# Patient Record
Sex: Female | Born: 1985 | Race: Black or African American | Hispanic: No | Marital: Single | Smoking: Never smoker
Health system: Southern US, Community
[De-identification: ages and names within clinical notes are randomized; demographics above are authoritative.]

## PROBLEM LIST (undated history)

## (undated) DIAGNOSIS — R102 Pelvic and perineal pain unspecified side: Secondary | ICD-10-CM

## (undated) DIAGNOSIS — F909 Attention-deficit hyperactivity disorder, unspecified type: Secondary | ICD-10-CM

## (undated) DIAGNOSIS — E559 Vitamin D deficiency, unspecified: Secondary | ICD-10-CM

## (undated) DIAGNOSIS — R112 Nausea with vomiting, unspecified: Secondary | ICD-10-CM

## (undated) DIAGNOSIS — T7840XA Allergy, unspecified, initial encounter: Secondary | ICD-10-CM

## (undated) DIAGNOSIS — M549 Dorsalgia, unspecified: Secondary | ICD-10-CM

## (undated) DIAGNOSIS — D649 Anemia, unspecified: Secondary | ICD-10-CM

## (undated) DIAGNOSIS — M1990 Unspecified osteoarthritis, unspecified site: Secondary | ICD-10-CM

## (undated) DIAGNOSIS — E0500 Thyrotoxicosis with diffuse goiter without thyrotoxic crisis or storm: Secondary | ICD-10-CM

## (undated) DIAGNOSIS — F419 Anxiety disorder, unspecified: Secondary | ICD-10-CM

## (undated) DIAGNOSIS — E538 Deficiency of other specified B group vitamins: Secondary | ICD-10-CM

## (undated) DIAGNOSIS — K219 Gastro-esophageal reflux disease without esophagitis: Secondary | ICD-10-CM

## (undated) DIAGNOSIS — F988 Other specified behavioral and emotional disorders with onset usually occurring in childhood and adolescence: Secondary | ICD-10-CM

## (undated) DIAGNOSIS — R87629 Unspecified abnormal cytological findings in specimens from vagina: Secondary | ICD-10-CM

## (undated) HISTORY — DX: Deficiency of other specified B group vitamins: E53.8

## (undated) HISTORY — DX: Anxiety disorder, unspecified: F41.9

## (undated) HISTORY — DX: Vitamin D deficiency, unspecified: E55.9

## (undated) HISTORY — DX: Other specified behavioral and emotional disorders with onset usually occurring in childhood and adolescence: F98.8

## (undated) HISTORY — DX: Thyrotoxicosis with diffuse goiter without thyrotoxic crisis or storm: E05.00

## (undated) HISTORY — DX: Gastro-esophageal reflux disease without esophagitis: K21.9

## (undated) HISTORY — DX: Nausea with vomiting, unspecified: R11.2

## (undated) HISTORY — DX: Allergy, unspecified, initial encounter: T78.40XA

## (undated) HISTORY — DX: Unspecified osteoarthritis, unspecified site: M19.90

## (undated) HISTORY — PX: WISDOM TOOTH EXTRACTION: SHX21

## (undated) HISTORY — PX: OTHER SURGICAL HISTORY: SHX169

---
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
## 2005-03-30 ENCOUNTER — Ambulatory Visit: Payer: Self-pay | Admitting: Internal Medicine

## 2005-04-12 ENCOUNTER — Ambulatory Visit: Payer: Self-pay | Admitting: Internal Medicine

## 2005-04-23 ENCOUNTER — Ambulatory Visit: Payer: Self-pay | Admitting: Internal Medicine

## 2005-05-21 ENCOUNTER — Ambulatory Visit: Payer: Self-pay | Admitting: Internal Medicine

## 2005-06-14 ENCOUNTER — Other Ambulatory Visit: Admission: RE | Admit: 2005-06-14 | Discharge: 2005-06-14 | Payer: Self-pay | Admitting: Internal Medicine

## 2005-08-30 ENCOUNTER — Ambulatory Visit: Payer: Self-pay | Admitting: Internal Medicine

## 2005-10-12 ENCOUNTER — Ambulatory Visit: Payer: Self-pay | Admitting: Internal Medicine

## 2005-10-16 ENCOUNTER — Other Ambulatory Visit: Admission: RE | Admit: 2005-10-16 | Discharge: 2005-10-16 | Payer: Self-pay | Admitting: Internal Medicine

## 2005-10-23 ENCOUNTER — Ambulatory Visit: Payer: Self-pay | Admitting: Internal Medicine

## 2005-11-07 ENCOUNTER — Other Ambulatory Visit: Admission: RE | Admit: 2005-11-07 | Discharge: 2005-11-07 | Payer: Self-pay | Admitting: Obstetrics and Gynecology

## 2006-03-14 ENCOUNTER — Inpatient Hospital Stay (HOSPITAL_COMMUNITY): Admission: AD | Admit: 2006-03-14 | Discharge: 2006-03-14 | Payer: Self-pay | Admitting: Obstetrics and Gynecology

## 2006-05-20 ENCOUNTER — Inpatient Hospital Stay (HOSPITAL_COMMUNITY): Admission: AD | Admit: 2006-05-20 | Discharge: 2006-05-20 | Payer: Self-pay | Admitting: *Deleted

## 2006-05-22 ENCOUNTER — Inpatient Hospital Stay (HOSPITAL_COMMUNITY): Admission: AD | Admit: 2006-05-22 | Discharge: 2006-05-24 | Payer: Self-pay | Admitting: Obstetrics and Gynecology

## 2006-07-02 LAB — CONVERTED CEMR LAB: Pap Smear: NORMAL

## 2006-09-11 ENCOUNTER — Other Ambulatory Visit: Admission: RE | Admit: 2006-09-11 | Discharge: 2006-09-11 | Payer: Self-pay | Admitting: Internal Medicine

## 2006-10-10 ENCOUNTER — Ambulatory Visit: Payer: Self-pay | Admitting: Internal Medicine

## 2006-10-10 LAB — CONVERTED CEMR LAB: Beta hcg, urine, semiquantitative: NEGATIVE

## 2006-10-14 LAB — CONVERTED CEMR LAB
ALT: 13 units/L (ref 0–35)
Basophils Absolute: 0 10*3/uL (ref 0.0–0.1)
Calcium: 9.1 mg/dL (ref 8.4–10.5)
Cholesterol: 195 mg/dL (ref 0–200)
Eosinophils Absolute: 0.2 10*3/uL (ref 0.0–0.6)
Eosinophils Relative: 4.1 % (ref 0.0–5.0)
GFR calc Af Amer: 103 mL/min
GFR calc non Af Amer: 85 mL/min
Glucose, Bld: 87 mg/dL (ref 70–99)
Lymphocytes Relative: 31.8 % (ref 12.0–46.0)
MCHC: 33.4 g/dL (ref 30.0–36.0)
MCV: 86.7 fL (ref 78.0–100.0)
Neutro Abs: 3.4 10*3/uL (ref 1.4–7.7)
Platelets: 190 10*3/uL (ref 150–400)
Sodium: 139 meq/L (ref 135–145)
TSH: 1.03 microintl units/mL (ref 0.35–5.50)
WBC: 5.8 10*3/uL (ref 4.5–10.5)

## 2007-01-09 ENCOUNTER — Ambulatory Visit: Payer: Self-pay | Admitting: Internal Medicine

## 2007-04-08 ENCOUNTER — Ambulatory Visit: Payer: Self-pay | Admitting: Internal Medicine

## 2007-06-26 ENCOUNTER — Encounter (HOSPITAL_COMMUNITY): Admission: RE | Admit: 2007-06-26 | Discharge: 2007-06-27 | Payer: Self-pay | Admitting: Internal Medicine

## 2007-07-02 ENCOUNTER — Ambulatory Visit: Payer: Self-pay | Admitting: Internal Medicine

## 2007-10-01 ENCOUNTER — Other Ambulatory Visit: Admission: RE | Admit: 2007-10-01 | Discharge: 2007-10-01 | Payer: Self-pay | Admitting: Internal Medicine

## 2007-10-08 ENCOUNTER — Encounter: Admission: RE | Admit: 2007-10-08 | Discharge: 2007-10-08 | Payer: Self-pay | Admitting: Internal Medicine

## 2007-11-03 ENCOUNTER — Encounter: Payer: Self-pay | Admitting: Internal Medicine

## 2007-11-04 ENCOUNTER — Encounter: Payer: Self-pay | Admitting: Internal Medicine

## 2007-11-04 ENCOUNTER — Ambulatory Visit (HOSPITAL_COMMUNITY): Admission: RE | Admit: 2007-11-04 | Discharge: 2007-11-04 | Payer: Self-pay | Admitting: Sports Medicine

## 2007-11-06 ENCOUNTER — Encounter: Payer: Self-pay | Admitting: Internal Medicine

## 2007-11-13 ENCOUNTER — Encounter: Payer: Self-pay | Admitting: Internal Medicine

## 2007-12-24 ENCOUNTER — Ambulatory Visit: Payer: Self-pay | Admitting: Internal Medicine

## 2007-12-24 DIAGNOSIS — R197 Diarrhea, unspecified: Secondary | ICD-10-CM

## 2007-12-24 DIAGNOSIS — F411 Generalized anxiety disorder: Secondary | ICD-10-CM | POA: Insufficient documentation

## 2007-12-24 DIAGNOSIS — R112 Nausea with vomiting, unspecified: Secondary | ICD-10-CM

## 2007-12-24 DIAGNOSIS — R1013 Epigastric pain: Secondary | ICD-10-CM

## 2007-12-24 LAB — CONVERTED CEMR LAB: Tissue Transglutaminase Ab, IgA: 0.5 units (ref <7)

## 2007-12-26 ENCOUNTER — Encounter: Payer: Self-pay | Admitting: Internal Medicine

## 2007-12-26 LAB — CONVERTED CEMR LAB
Basophils Absolute: 0.1 10*3/uL (ref 0.0–0.1)
Basophils Relative: 1.2 % (ref 0.0–3.0)
Eosinophils Absolute: 0.1 10*3/uL (ref 0.0–0.7)
Eosinophils Relative: 1.1 % (ref 0.0–5.0)
HCT: 36.2 % (ref 36.0–46.0)
Hemoglobin: 12.5 g/dL (ref 12.0–15.0)
MCHC: 34.4 g/dL (ref 30.0–36.0)
MCV: 89.1 fL (ref 78.0–100.0)
Monocytes Absolute: 0.5 10*3/uL (ref 0.1–1.0)
Neutro Abs: 3.3 10*3/uL (ref 1.4–7.7)
RBC: 4.07 M/uL (ref 3.87–5.11)
WBC: 6.6 10*3/uL (ref 4.5–10.5)

## 2009-04-02 HISTORY — PX: LEEP: SHX91

## 2010-01-04 ENCOUNTER — Other Ambulatory Visit: Payer: Self-pay | Admitting: Obstetrics and Gynecology

## 2010-02-16 ENCOUNTER — Other Ambulatory Visit: Payer: Self-pay | Admitting: Obstetrics and Gynecology

## 2010-04-23 ENCOUNTER — Encounter: Payer: Self-pay | Admitting: Internal Medicine

## 2010-05-02 NOTE — Assessment & Plan Note (Signed)
Summary: depo inj/cbs  Nurse Visit    Prior Medications: DEPO-PROVERA 150 MG/ML IM SUSP (MEDROXYPROGESTERONE ACETATE)  Current Allergies: No known allergies     Medication Administration  Injection # 1:    Medication: Depo-Provera 150mg     Diagnosis: CONTRACEPTIVE MANAGEMENT NOS (ICD-V25.9)    Route: IM    Site: L deltoid    Exp Date: 03/21/2008    Lot #: [id number]    Mfr: GREENSTONE    Patient tolerated injection without complications    Given by: Floydene Flock CMA (April 08, 2007 2:03 PM)  Orders Added: 1)  Depo-Provera 150mg  [J1055] 2)  Admin of Therapeutic Inj  intramuscular or subcutaneous Quintilian.[email or web address]    ]  Medication Administration  Injection # 1:    Medication: Depo-Provera 150mg     Diagnosis: CONTRACEPTIVE MANAGEMENT NOS (ICD-V25.9)    Route: IM    Site: L deltoid    Exp Date: 03/21/2008    Lot #: [id number]    Mfr: GREENSTONE    Patient tolerated injection without complications    Given by: Floydene Flock CMA (April 08, 2007 2:03 PM)  Orders Added: 1)  Depo-Provera 150mg  [J1055] 2)  Admin of Therapeutic Inj  intramuscular or subcutaneous [[id number]]

## 2011-02-06 ENCOUNTER — Encounter (HOSPITAL_COMMUNITY): Payer: Self-pay | Admitting: Pharmacy Technician

## 2011-02-06 NOTE — Patient Instructions (Addendum)
   Your procedure is scheduled on:  Monday, Nov. 19th  Enter through the Main Entrance of [facility] at: 9:00am Pick up the phone at the desk and dial [phone] and inform us of your arrival.  Please call this number if you have any problems the morning of surgery: [phone]  Remember: Do not eat food after midnight: Sunday Do not drink clear liquids after: Sunday Take these medicines the morning of surgery with a SIP OF WATER:  none  Do not wear jewelry, make-up, or FINGER nail polish Do not wear lotions, powders, or perfumes.  You may not  wear deodorant. Do not shave 48 hours prior to surgery. Do not bring valuables to the hospital.  Patients discharged on the day of surgery will not be allowed to drive home.   Home with Mother Bjorn Loser cell [phone]   Remember to use your hibiclens as instructed.Please shower with 1/2 bottle the evening before your surgery and the other 1/2 bottle the morning of surgery.

## 2011-02-07 ENCOUNTER — Encounter (HOSPITAL_COMMUNITY): Payer: Self-pay

## 2011-02-07 ENCOUNTER — Encounter (HOSPITAL_COMMUNITY)
Admission: RE | Admit: 2011-02-07 | Discharge: 2011-02-07 | Disposition: A | Discharge status: Home / Self Care | Payer: 59 | Source: Ambulatory Visit | Attending: Obstetrics & Gynecology | Admitting: Obstetrics & Gynecology

## 2011-02-07 HISTORY — DX: Dorsalgia, unspecified: M54.9

## 2011-02-07 HISTORY — DX: Anxiety disorder, unspecified: F41.9

## 2011-02-07 HISTORY — DX: Anemia, unspecified: D64.9

## 2011-02-07 HISTORY — DX: Attention-deficit hyperactivity disorder, unspecified type: F90.9

## 2011-02-07 LAB — CBC
HCT: 41.4 % (ref 36.0–46.0)
Hemoglobin: 13.4 g/dL (ref 12.0–15.0)
MCH: 29.4 pg (ref 26.0–34.0)
MCHC: 32.4 g/dL (ref 30.0–36.0)
MCV: 90.8 fL (ref 78.0–100.0)

## 2011-02-07 NOTE — Pre-Procedure Instructions (Signed)
OK to see patient DOS 

## 2011-02-19 ENCOUNTER — Encounter (HOSPITAL_COMMUNITY)
Admission: RE | Disposition: A | Discharge status: Home / Self Care | Payer: Self-pay | Source: Ambulatory Visit | Attending: Obstetrics & Gynecology

## 2011-02-19 ENCOUNTER — Encounter (HOSPITAL_COMMUNITY): Payer: Self-pay | Admitting: *Deleted

## 2011-02-19 ENCOUNTER — Ambulatory Visit (HOSPITAL_COMMUNITY): Payer: 59 | Admitting: Anesthesiology

## 2011-02-19 ENCOUNTER — Encounter (HOSPITAL_COMMUNITY): Payer: Self-pay | Admitting: Anesthesiology

## 2011-02-19 ENCOUNTER — Ambulatory Visit (HOSPITAL_COMMUNITY)
Admission: RE | Admit: 2011-02-19 | Discharge: 2011-02-19 | Disposition: A | Discharge status: Home / Self Care | Payer: 59 | Source: Ambulatory Visit | Attending: Obstetrics & Gynecology | Admitting: Obstetrics & Gynecology

## 2011-02-19 DIAGNOSIS — Z01818 Encounter for other preprocedural examination: Secondary | ICD-10-CM | POA: Insufficient documentation

## 2011-02-19 DIAGNOSIS — M545 Low back pain, unspecified: Secondary | ICD-10-CM | POA: Insufficient documentation

## 2011-02-19 DIAGNOSIS — N949 Unspecified condition associated with female genital organs and menstrual cycle: Secondary | ICD-10-CM | POA: Insufficient documentation

## 2011-02-19 DIAGNOSIS — R102 Pelvic and perineal pain: Secondary | ICD-10-CM

## 2011-02-19 DIAGNOSIS — Z01812 Encounter for preprocedural laboratory examination: Secondary | ICD-10-CM | POA: Insufficient documentation

## 2011-02-19 HISTORY — DX: Pelvic and perineal pain unspecified side: R10.20

## 2011-02-19 HISTORY — DX: Pelvic and perineal pain: R10.2

## 2011-02-19 HISTORY — PX: LAPAROSCOPY: SHX197

## 2011-02-19 SURGERY — LAPAROSCOPY, DIAGNOSTIC
Anesthesia: General | Wound class: Clean Contaminated

## 2011-02-19 MED ORDER — MIDAZOLAM HCL 5 MG/5ML IJ SOLN
INTRAMUSCULAR | Status: DC | PRN
  Administered 2011-02-19: 1.5 mg via INTRAVENOUS
  Administered 2011-02-19: .5 mg via INTRAVENOUS

## 2011-02-19 MED ORDER — SCOPOLAMINE 1 MG/3DAYS TD PT72
1.0000 | MEDICATED_PATCH | TRANSDERMAL | Status: DC
  Administered 2011-02-19: 1.5 mg via TRANSDERMAL
  Administered 2011-02-19: 1 via TRANSDERMAL

## 2011-02-19 MED ORDER — ONDANSETRON HCL 4 MG/2ML IJ SOLN
INTRAMUSCULAR | Status: AC
  Administered 2011-02-19: 4 mg
  Filled 2011-02-19: qty 2

## 2011-02-19 MED ORDER — CEFAZOLIN SODIUM 1-5 GM-% IV SOLN: 1.0000 g | INTRAVENOUS | Status: DC

## 2011-02-19 MED ORDER — METRONIDAZOLE IN NACL 5-0.79 MG/ML-% IV SOLN
500.0000 mg | Freq: Once | INTRAVENOUS | Status: AC
  Administered 2011-02-19: 500 mg via INTRAVENOUS
  Filled 2011-02-19: qty 100

## 2011-02-19 MED ORDER — MIDAZOLAM HCL 2 MG/2ML IJ SOLN
INTRAMUSCULAR | Status: AC
  Filled 2011-02-19: qty 2

## 2011-02-19 MED ORDER — PROPOFOL 10 MG/ML IV EMUL
INTRAVENOUS | Status: DC | PRN
  Administered 2011-02-19: 150 mg via INTRAVENOUS

## 2011-02-19 MED ORDER — KETOROLAC TROMETHAMINE 30 MG/ML IJ SOLN
INTRAMUSCULAR | Status: DC | PRN
  Administered 2011-02-19: 30 mg via INTRAVENOUS

## 2011-02-19 MED ORDER — SODIUM CHLORIDE 0.9 % IV SOLN
INTRAVENOUS | Status: DC | PRN
  Administered 2011-02-19: 11:00:00 via INTRAVENOUS

## 2011-02-19 MED ORDER — LIDOCAINE HCL (CARDIAC) 20 MG/ML IV SOLN
INTRAVENOUS | Status: AC
  Filled 2011-02-19: qty 5

## 2011-02-19 MED ORDER — METOCLOPRAMIDE HCL 5 MG/ML IJ SOLN
INTRAMUSCULAR | Status: AC
  Administered 2011-02-19: 10 mg
  Filled 2011-02-19: qty 2

## 2011-02-19 MED ORDER — DEXAMETHASONE SODIUM PHOSPHATE 10 MG/ML IJ SOLN
INTRAMUSCULAR | Status: AC
  Filled 2011-02-19: qty 1

## 2011-02-19 MED ORDER — KETOROLAC TROMETHAMINE 30 MG/ML IJ SOLN: 15.0000 mg | Freq: Once | INTRAMUSCULAR | Status: DC | PRN

## 2011-02-19 MED ORDER — FENTANYL CITRATE 0.05 MG/ML IJ SOLN
INTRAMUSCULAR | Status: AC
  Filled 2011-02-19: qty 5

## 2011-02-19 MED ORDER — SCOPOLAMINE 1 MG/3DAYS TD PT72
MEDICATED_PATCH | TRANSDERMAL | Status: AC
  Administered 2011-02-19: 1.5 mg via TRANSDERMAL
  Filled 2011-02-19: qty 1

## 2011-02-19 MED ORDER — NEOSTIGMINE METHYLSULFATE 1 MG/ML IJ SOLN
INTRAMUSCULAR | Status: AC
  Filled 2011-02-19: qty 10

## 2011-02-19 MED ORDER — ROCURONIUM BROMIDE 100 MG/10ML IV SOLN
INTRAVENOUS | Status: DC | PRN
  Administered 2011-02-19: 30 mg via INTRAVENOUS

## 2011-02-19 MED ORDER — FENTANYL CITRATE 0.05 MG/ML IJ SOLN
INTRAMUSCULAR | Status: DC | PRN
Start: 2011-02-19 — End: 2011-02-19
  Administered 2011-02-19: 100 ug via INTRAVENOUS
  Administered 2011-02-19: 50 ug via INTRAVENOUS
  Administered 2011-02-19: 25 ug via INTRAVENOUS
  Administered 2011-02-19: 50 ug via INTRAVENOUS
  Administered 2011-02-19: 25 ug via INTRAVENOUS

## 2011-02-19 MED ORDER — DEXTROSE 5 % IN LACTATED RINGERS IV BOLUS
500.0000 mL | Freq: Once | INTRAVENOUS | Status: AC
  Administered 2011-02-19: 500 mL via INTRAVENOUS

## 2011-02-19 MED ORDER — PROPOFOL 10 MG/ML IV EMUL
INTRAVENOUS | Status: AC
  Filled 2011-02-19: qty 20

## 2011-02-19 MED ORDER — FENTANYL CITRATE 0.05 MG/ML IJ SOLN: 25.0000 ug | INTRAMUSCULAR | Status: DC | PRN

## 2011-02-19 MED ORDER — NEOSTIGMINE METHYLSULFATE 1 MG/ML IJ SOLN
INTRAMUSCULAR | Status: DC | PRN
  Administered 2011-02-19: 3 mg via INTRAVENOUS

## 2011-02-19 MED ORDER — ONDANSETRON HCL 4 MG/2ML IJ SOLN
INTRAMUSCULAR | Status: AC
  Filled 2011-02-19: qty 2

## 2011-02-19 MED ORDER — DEXAMETHASONE SODIUM PHOSPHATE 4 MG/ML IJ SOLN
INTRAMUSCULAR | Status: DC | PRN
  Administered 2011-02-19: 8 mg via INTRAVENOUS

## 2011-02-19 MED ORDER — GLYCOPYRROLATE 0.2 MG/ML IJ SOLN
INTRAMUSCULAR | Status: DC | PRN
  Administered 2011-02-19: .4 mg via INTRAVENOUS

## 2011-02-19 MED ORDER — GLYCOPYRROLATE 0.2 MG/ML IJ SOLN
INTRAMUSCULAR | Status: AC
  Filled 2011-02-19: qty 2

## 2011-02-19 MED ORDER — LIDOCAINE HCL (CARDIAC) 20 MG/ML IV SOLN
INTRAVENOUS | Status: DC | PRN
  Administered 2011-02-19: 50 mg via INTRAVENOUS

## 2011-02-19 MED ORDER — LACTATED RINGERS IV SOLN
INTRAVENOUS | Status: DC
  Administered 2011-02-19: 09:00:00 via INTRAVENOUS

## 2011-02-19 MED ORDER — ONDANSETRON HCL 4 MG/2ML IJ SOLN
INTRAMUSCULAR | Status: DC | PRN
  Administered 2011-02-19: 4 mg via INTRAVENOUS

## 2011-02-19 MED ORDER — KETOROLAC TROMETHAMINE 30 MG/ML IJ SOLN
INTRAMUSCULAR | Status: AC
  Filled 2011-02-19: qty 1

## 2011-02-19 MED ORDER — ROCURONIUM BROMIDE 50 MG/5ML IV SOLN
INTRAVENOUS | Status: AC
  Filled 2011-02-19: qty 1

## 2011-02-19 SURGICAL SUPPLY — 24 items
APL SKNCLS STERI-STRIP NONHPOA (GAUZE/BANDAGES/DRESSINGS) ×1
BENZOIN TINCTURE PRP APPL 2/3 (GAUZE/BANDAGES/DRESSINGS) ×2 IMPLANT
CATH ROBINSON RED A/P 16FR (CATHETERS) IMPLANT
CHLORAPREP W/TINT 26ML (MISCELLANEOUS) ×2 IMPLANT
CLOTH BEACON ORANGE TIMEOUT ST (SAFETY) ×2 IMPLANT
GLOVE BIOGEL PI IND STRL 7.0 (GLOVE) ×1 IMPLANT
GLOVE BIOGEL PI INDICATOR 7.0 (GLOVE) ×1
GLOVE ECLIPSE 6.5 STRL STRAW (GLOVE) ×4 IMPLANT
GOWN PREVENTION PLUS LG XLONG (DISPOSABLE) ×4 IMPLANT
PACK LAPAROSCOPY BASIN (CUSTOM PROCEDURE TRAY) ×2 IMPLANT
SEALER TISSUE G2 CVD JAW 35 (ENDOMECHANICALS) IMPLANT
SEALER TISSUE G2 CVD JAW 45CM (ENDOMECHANICALS)
SET IRRIG TUBING LAPAROSCOPIC (IRRIGATION / IRRIGATOR) IMPLANT
STRIP CLOSURE SKIN 1/2X4 (GAUZE/BANDAGES/DRESSINGS) ×2 IMPLANT
STRIP CLOSURE SKIN 1/4X4 (GAUZE/BANDAGES/DRESSINGS) ×2 IMPLANT
SUT VIC AB 4-0 SH 27 (SUTURE)
SUT VIC AB 4-0 SH 27XANBCTRL (SUTURE) IMPLANT
SUT VICRYL 0 UR6 27IN ABS (SUTURE) IMPLANT
SUT VICRYL 4-0 PS2 18IN ABS (SUTURE) ×2 IMPLANT
TOWEL OR 17X24 6PK STRL BLUE (TOWEL DISPOSABLE) ×4 IMPLANT
TROCAR BALLN 12MMX100 BLUNT (TROCAR) IMPLANT
TROCAR XCEL NON-BLD 11X100MML (ENDOMECHANICALS) IMPLANT
TROCAR XCEL NON-BLD 5MMX100MML (ENDOMECHANICALS) IMPLANT
WATER STERILE IRR 1000ML POUR (IV SOLUTION) ×2 IMPLANT

## 2011-02-19 NOTE — Op Note (Signed)
02/19/2011  11:19 AM  PATIENT:  Sara Brewer  25 y.o. female G0 with pelvic and low back pain.  Work up with ortho negative thus far.  Patient has also had a normal ultrasound.  PRE-OPERATIVE DIAGNOSIS:  Pelvic Pain  POST-OPERATIVE DIAGNOSIS:  Pelvic Pain  PROCEDURE:  Procedure(s): LAPAROSCOPY DIAGNOSTIC  SURGEON:  Amador Braddy,M SUZANNE  ASSISTANTS: OR staff   ANESTHESIA:   general and pudendal block  ESTIMATED BLOOD LOSS: * No blood loss amount entered *  BLOOD ADMINISTERED:none   FLUIDS: 800cc LR  UOP: 400 cc clear in foley catheter  SPECIMEN:  none  DISPOSITION OF SPECIMEN:  N/A  FINDINGS: normal pelvis.  No evidence of endometriosis.  Normal ovaries.  No adhesions.  Normal upper abdomen.  Normal appendix.  DESCRIPTION OF OPERATION: Patient was taken to the operating room. She is placed in the supine position. Her arms were tucked at the side.  Arms were also padded. Legs are positioned in the low lithotomy position with SCDs on her lower extremities bilaterally. General endotracheal anesthesia wasadministered by the anesthesia staff without difficulty. Time out was performed.  Legs were lifted to the high lithotomy position. Chlor prep was used to prep the abdomen and Betadine was used to prep the inner thighs and perineum. After 3 minutes the patient was draped in a normal standard fashion.   Attention was turned to the vagina. A bivalve speculum was placed in the vagina. The anterior lip of the cervix was grasped with a single-tooth tenaculum. A Hulka clamp was advanced into the cervical canal and attached to the cervix as a means of manipulating the uterus during the procedure. The tenaculum was then removed. The bivalve speculum was removed. A Foley catheter was inserted into the bladder to straight drain. Legs were lowered to the low lithotomy position and attention was turned to the abdomen. Sterile gloves and gowns were changed.  Attention was turned to the umbilicus.  Using a #11 blade a 5 mm skin incision was made. A Veress needle was obtained with the stock-cock open. The abdomen was elevated and the needle was inserted directly into the abdomen. The peritoneum was noted as it was popped through. A syringe of normal saline was attached to the Veress needle. An aspiration was performed. No blood or fluid was noted. Fluid injected easily into the syringe. A second aspiration was performed. No blood, fluid, or saline was noted. Then the saline dripped easily into the syringe. Next under low flow of CO2 gas a pneumoperitoneum was achieved. Pressures were never over 8 mmHg. Once 2.5 L of gas was in the abdomen the needle was removed. A 5 mm diagnostic laparoscope attached to a 5 mm Optiview port was obtained. Under direct visualization this was passed into the abdomen.  The patient was then placed in Trendelenburg positioning. The upper abdomen was visualized.  The liver edge, gall bladder, and stomach edge were normal.  The pelvic sidewalls were visualized and were normal.  The anterior and posterior cul-de-sacs were visualized and were normal. The ovaries were freely mobile. The tubes appeared normal. There was no clubbing. There was no evidence of endometriosis or other lesions. The appendix was noted and it appeared normal as well. There was no tissue that required biopsing and there were no adhesions that required lysing, so the procedure was ended. The laparoscope was removed and the pneumoperitoneum was relieved. The patient was taken out of Trendelenburg positioning. Before the midline scope was removed several deep breaths were given  by the CRNA. The midline port was removed. The skin was cleansed and incision was closed with Dermabond.  The instruments were removed from the vagina and no bleeding was noted from the cervix. A Foley catheter was removed. The prep was cleansed of the skin. The patient was placed back in the supine position. She was awakened from anesthesia  and extubated without difficulty. Sponge, needles, laps, and instrument counts were correct x2. Patient was taken to the recovery room in stable condition.  COUNTS:  YES  PLAN OF CARE: Transfer to PACU

## 2011-02-19 NOTE — Progress Notes (Signed)
Pt dressed and sitting in recliner.  Pale and clammy and c/o nausea.  Dr. Rodman Pickle notified and orders received.  IVF restarted per order.  Zofran given per order.  Will continue to monitor.

## 2011-02-19 NOTE — Preoperative (Signed)
Beta Blockers   Reason not to administer Beta Blockers:Not Applicable 

## 2011-02-19 NOTE — Anesthesia Postprocedure Evaluation (Signed)
Anesthesia Post Note  Patient: Sara Brewer  Procedure(s) Performed:  LAPAROSCOPY DIAGNOSTIC - Diagnostic Laparoscopy  Anesthesia type: General  Patient location: PACU  Post pain: Pain level controlled  Post assessment: Post-op Vital signs reviewed  Last Vitals:  Filed Vitals:   02/19/11 1200  BP: 115/59  Pulse: 66  Temp:   Resp: 16    Post vital signs: Reviewed  Level of consciousness: sedated  Complications: No apparent anesthesia complications

## 2011-02-19 NOTE — H&P (Signed)
Sara Brewer is an 25 y.o. female with pelvic and low back pain.  She has been evaluated with orthopedics and via ultrasound.  Evaluation thus far has been negative.  We have discussed proceeding with diagnostic laparoscopy to definitively diagnose or rule out endometriosis.  Patient understands that if endometriosis is present, I will biopsy for diagnosis and proceed with treatment.  Risks, benefits have been discussed and are documented in my office chart.  Patient here with her mother and friend today and are ready to proceed.  Pertinent Gynecological History: Menses: flow is moderate Bleeding: normal Contraception: Implanon DES exposure: denies Blood transfusions: none Sexually transmitted diseases: no past history Previous GYN Procedures: LEEP  Last mammogram: N/A Date: N/A Last pap: ASCUS with negative HR HPV Date: 10/12 OB History: G0, P0   Menstrual History: Menarche age: [age] No LMP recorded.    Past Medical History  Diagnosis Date  . Anemia     iron  . Back pain   . Anxiety   . ADD (attention deficit disorder with hyperactivity)   . Pelvic pain in female     Past Surgical History  Procedure Date  . Wisdom tooth extraction   . Gum grafts     History reviewed. No pertinent family history.  Social History:  reports that she has never smoked. She has never used smokeless tobacco. She reports that she does not drink alcohol or use illicit drugs.  Allergies:  Allergies  Allergen Reactions  . Penicillins Anaphylaxis  . Codeine     REACTION: vomiting  . Milk-Related Compounds Hives and Nausea And Vomiting  . Sulfonamide Derivatives     REACTION: vomiting    Prescriptions prior to admission  Medication Sig Dispense Refill  . b complex vitamins tablet Take 1 tablet by mouth daily.        Marland Kitchen CALCIUM CARBONATE PO Take 1 tablet by mouth daily.        . cholecalciferol (VITAMIN D) 1000 UNITS tablet Take 2,000 Units by mouth daily.        . Etonogestrel  (IMPLANON [place]) Inject into the skin.        . IRON PO Take 1 tablet by mouth daily.        Marland Kitchen lisdexamfetamine (VYVANSE) 20 MG capsule Take 20 mg by mouth every morning.        . magnesium oxide (MAG-OX) 400 MG tablet Take 400 mg by mouth daily.        . Multiple Vitamins-Minerals (MULTIVITAMIN WITH MINERALS) tablet Take 1 tablet by mouth daily.          Review of Systems  Constitutional: Negative for fever and chills.  Eyes: Negative for blurred vision.  Respiratory: Negative for cough and hemoptysis.   Cardiovascular: Negative for chest pain and palpitations.  Gastrointestinal: Negative for heartburn.  Genitourinary: Negative for dysuria.  Musculoskeletal: Negative for myalgias.  Skin: Negative for rash.  Neurological: Negative for dizziness and headaches.  Endo/Heme/Allergies: Does not bruise/bleed easily.  Psychiatric/Behavioral: Negative for depression.    Blood pressure 119/84, pulse 73, temperature 98.1 F (36.7 C), temperature source Oral, resp. rate 18, SpO2 100.00%. Physical Exam  Constitutional: She is oriented to person, place, and time. She appears well-developed and well-nourished.  HENT:  Head: Normocephalic and atraumatic.  Eyes: Pupils are equal, round, and reactive to light.  Neck: Normal range of motion. Neck supple.  Cardiovascular: Normal rate, regular rhythm and normal heart sounds.   Respiratory: Effort normal and breath sounds normal.  GI: Soft. Bowel sounds are normal. She exhibits no distension. There is no tenderness.  Musculoskeletal: Normal range of motion.  Neurological: She is alert and oriented to person, place, and time.  Skin: Skin is warm and dry.  Psychiatric: She has a normal mood and affect.    Results for orders placed during the hospital encounter of 02/19/11 (from the past 24 hour(s))  PREGNANCY, URINE     Status: Normal   Collection Time   02/19/11  9:12 AM      Component Value Range   Preg Test, Ur NEGATIVE      No results  found.  Assessment/Plan: 25 year old SWF with pelvic and back pain here for diagnostic laparoscopy.  Trae Bovenzi,M SUZANNE 02/19/2011, 10:09 AM

## 2011-02-19 NOTE — Anesthesia Procedure Notes (Signed)
Procedure Name: Intubation Date/Time: 02/19/2011 10:43 AM Performed by: Harriett Rush, Medardo Hassing ADEDAYO Pre-anesthesia Checklist: Patient identified, Patient being monitored, Emergency Drugs available, Timeout performed and Suction available Patient Re-evaluated:Patient Re-evaluated prior to inductionOxygen Delivery Method: Circle System Utilized Preoxygenation: Pre-oxygenation with 100% oxygen Intubation Type: IV induction Ventilation: Mask ventilation without difficulty Laryngoscope Size: Miller and 2 Grade View: Grade I Tube type: Oral Tube size: 6.0 mm Number of attempts: 1 Secured at: 20 cm Tube secured with: Tape (Pink) Dental Injury: Teeth and Oropharynx as per pre-operative assessment

## 2011-02-19 NOTE — Anesthesia Preprocedure Evaluation (Addendum)
Anesthesia Evaluation  Patient identified by MRN, date of birth, ID band Patient awake    Reviewed: Allergy & Precautions, H&P , NPO status , Patient's Chart, lab work & pertinent test results, reviewed documented beta blocker date and time   History of Anesthesia Complications (+) PONV  Airway Mallampati: I TM Distance: >3 FB Neck ROM: full    Dental  (+) Teeth Intact   Pulmonary neg pulmonary ROS,  clear to auscultation  Pulmonary exam normal       Cardiovascular neg cardio ROS regular Normal    Neuro/Psych PSYCHIATRIC DISORDERS (ADD) Negative Neurological ROS     GI/Hepatic negative GI ROS, Neg liver ROS,   Endo/Other  Negative Endocrine ROS  Renal/GU negative Renal ROS  Genitourinary negative   Musculoskeletal   Abdominal   Peds  Hematology negative hematology ROS (+)   Anesthesia Other Findings   Reproductive/Obstetrics negative OB ROS                          Anesthesia Physical Anesthesia Plan  ASA: I  Anesthesia Plan: General   Post-op Pain Management:    Induction:   Airway Management Planned: Oral ETT  Additional Equipment:   Intra-op Plan:   Post-operative Plan:   Informed Consent: I have reviewed the patients History and Physical, chart, labs and discussed the procedure including the risks, benefits and alternatives for the proposed anesthesia with the patient or authorized representative who has indicated his/her understanding and acceptance.   Dental Advisory Given  Plan Discussed with: CRNA and Surgeon  Anesthesia Plan Comments:         Anesthesia Quick Evaluation

## 2011-02-19 NOTE — Transfer of Care (Signed)
Immediate Anesthesia Transfer of Care Note  Patient: Sara Brewer  Procedure(s) Performed:  LAPAROSCOPY DIAGNOSTIC - Diagnostic Laparoscopy  Patient Location: PACU  Anesthesia Type: General  Level of Consciousness: awake, alert  and oriented  Airway & Oxygen Therapy: Patient Spontanous Breathing and Patient connected to nasal cannula oxygen  Post-op Assessment: Report given to PACU RN, Post -op Vital signs reviewed and stable and Patient moving all extremities X 4  Post vital signs: Reviewed and stable  Complications: No apparent anesthesia complications

## 2011-02-20 ENCOUNTER — Encounter (HOSPITAL_COMMUNITY): Payer: Self-pay | Admitting: Obstetrics & Gynecology

## 2011-02-22 ENCOUNTER — Encounter (HOSPITAL_COMMUNITY): Payer: Self-pay | Admitting: *Deleted

## 2011-02-22 ENCOUNTER — Inpatient Hospital Stay (HOSPITAL_COMMUNITY)
Admission: AD | Admit: 2011-02-22 | Discharge: 2011-02-23 | Disposition: A | Discharge status: Home / Self Care | Payer: 59 | Source: Ambulatory Visit | Attending: Obstetrics & Gynecology | Admitting: Obstetrics & Gynecology

## 2011-02-22 DIAGNOSIS — K5900 Constipation, unspecified: Secondary | ICD-10-CM | POA: Insufficient documentation

## 2011-02-22 DIAGNOSIS — R112 Nausea with vomiting, unspecified: Secondary | ICD-10-CM | POA: Insufficient documentation

## 2011-02-22 DIAGNOSIS — E860 Dehydration: Secondary | ICD-10-CM | POA: Insufficient documentation

## 2011-02-22 LAB — CBC
MCH: 29.8 pg (ref 26.0–34.0)
Platelets: 240 10*3/uL (ref 150–400)
RBC: 4.19 MIL/uL (ref 3.87–5.11)
WBC: 9.4 10*3/uL (ref 4.0–10.5)

## 2011-02-22 LAB — COMPREHENSIVE METABOLIC PANEL
ALT: 15 U/L (ref 0–35)
AST: 22 U/L (ref 0–37)
CO2: 22 mEq/L (ref 19–32)
Calcium: 9.5 mg/dL (ref 8.4–10.5)
Chloride: 103 mEq/L (ref 96–112)
GFR calc non Af Amer: >90 mL/min (ref >90)
Sodium: 136 mEq/L (ref 135–145)

## 2011-02-22 LAB — URINALYSIS, ROUTINE W REFLEX MICROSCOPIC
Leukocytes, UA: NEGATIVE
Nitrite: NEGATIVE
Specific Gravity, Urine: 1.015 (ref 1.005–1.030)
Urobilinogen, UA: 0.2 mg/dL (ref 0.0–1.0)

## 2011-02-22 MED ORDER — LACTATED RINGERS IV SOLN: INTRAVENOUS | Status: DC

## 2011-02-22 MED ORDER — PROMETHAZINE HCL 50 MG RE SUPP: 50.0000 mg | Freq: Four times a day (QID) | RECTAL | Status: DC | PRN

## 2011-02-22 MED ORDER — POTASSIUM CHLORIDE 2 MEQ/ML IV SOLN
INTRAVENOUS | Status: AC
  Filled 2011-02-22: qty 1000

## 2011-02-22 MED ORDER — DEXTROSE 5 % IN LACTATED RINGERS IV BOLUS
1000.0000 mL | Freq: Once | INTRAVENOUS | Status: AC
  Administered 2011-02-22: 1000 mL via INTRAVENOUS

## 2011-02-22 NOTE — Progress Notes (Signed)
Pt states had laparoscopy via Dr. Hyacinth Meeker Monday, has had n/v since 0145 this am, states vomiting all day. Incision healing well, pt states has been afebrile. Noted burning day of and day post surgery.

## 2011-02-22 NOTE — ED Provider Notes (Signed)
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  Patient underwent a diagnostic laparoscopy on Monday (today is POD#3) for history of chronic pelvic and back pain.  The surgery was uneventful and no abnormal findings were noted.  Patient reported removing the scopolamine patch yesterday (which was placed per-operatively for nausea prevention).  Within an hour, the nausea began.  She has thrown up several times today but is voiding without pain or difficulty.  No fevers.  She has also not had a bowel movement since before the surgery.  The patient has tried oral caffeinated beverages and oral dulcolax without success.  She rode in a car for a fair amount of the day to visit relatives.  The Thanksgiving meal and riding seemed to exacerbate her symptoms.  Chief Complaint  Patient presents with  . Emesis  . Post-op Problem   Emesis  This is a new problem. The current episode started yesterday. The problem occurs 5 to 10 times per day. The problem has been waxing and waning. Vomiting appearance: appears to be food contents. There has been no fever. Associated symptoms include abdominal pain. Pertinent negatives include no arthralgias, chest pain, chills, coughing, diarrhea, dizziness, fever, headaches, myalgias, URI or weight loss. Risk factors: none. She has tried increased fluids (dulcolax orally) for the symptoms. The treatment provided no relief.    Pertinent Gynecological History: Menses: regular with normal flow Bleeding: normal Contraception: Implanon DES exposure: denies Blood transfusions: none Sexually transmitted diseases: no past history Previous GYN Procedures: LEEP  Last mammogram: N/A Date: N/A Last pap: ASCUS Date: 2012   Past Medical History  Diagnosis Date  . Anemia     iron  . Back pain   . Anxiety   . ADD (attention deficit disorder with hyperactivity)   . Pelvic pain in female     Past Surgical History  Procedure  Date  . Wisdom tooth extraction   . Gum grafts   . Laparoscopy 02/19/2011    Procedure: LAPAROSCOPY DIAGNOSTIC;  Surgeon: Lum Keas;  Location: [facility];  Service: Gynecology;  Laterality: N/A;  Diagnostic Laparoscopy    No family history on file.  History  Substance Use Topics  . Smoking status: Never Smoker   . Smokeless tobacco: Never Used  . Alcohol Use: No    Allergies:  Allergies  Allergen Reactions  . Penicillins Anaphylaxis  . Codeine Nausea And Vomiting  . Milk-Related Compounds Hives and Nausea And Vomiting  . Sulfonamide Derivatives Nausea And Vomiting    Prescriptions prior to admission  Medication Sig Dispense Refill  . b complex vitamins tablet Take 1 tablet by mouth daily.       Marland Kitchen ibuprofen (ADVIL,MOTRIN) 800 MG tablet Take 800 mg by mouth every 8 (eight) hours as needed. For pain       . Multiple Vitamins-Minerals (MULTIVITAMIN WITH MINERALS) tablet Take 1 tablet by mouth daily.        Marland Kitchen oxyCODONE-acetaminophen (PERCOCET) 5-325 MG per tablet Take 1 tablet by mouth every 4 (four) hours as needed. For pain       . CALCIUM CARBONATE PO Take 1 tablet by mouth daily.        . cholecalciferol (VITAMIN D) 1000 UNITS tablet Take 2,000 Units by mouth daily.       . Etonogestrel (IMPLANON [place]) Inject into the skin.       Marland Kitchen lisdexamfetamine (VYVANSE) 20 MG capsule Take 20 mg by mouth  every morning.        . magnesium oxide (MAG-OX) 400 MG tablet Take 400 mg by mouth daily.          Review of Systems  Constitutional: Negative for fever, chills and weight loss.  Respiratory: Negative for cough.   Cardiovascular: Negative for chest pain.  Gastrointestinal: Positive for vomiting and abdominal pain. Negative for diarrhea.  Musculoskeletal: Negative for myalgias and arthralgias.  Neurological: Negative for dizziness and headaches.  Psychiatric/Behavioral: The patient is nervous/anxious.    Physical Exam   Blood pressure 112/64, pulse 69, temperature 97.2 F (36.2  C), temperature source Oral, resp. rate 18, height 5\' 5"  (1.651 m), weight 55.055 kg (121 lb 6 oz), last menstrual period 01/24/2011.  Physical Exam  Nursing note and vitals reviewed. Constitutional: She is oriented to person, place, and time. She appears well-developed and well-nourished.  HENT:  Head: Normocephalic and atraumatic.  Neck: Normal range of motion. Neck supple.  Cardiovascular: Normal rate, regular rhythm and normal heart sounds.   Respiratory: Effort normal and breath sounds normal.  GI: Soft. Bowel sounds are normal. She exhibits distension. She exhibits no mass. There is tenderness. There is no rebound and no guarding.       Mild distention and mild tenderness to palpation around umbilicus.  No surgical abdomen.  Musculoskeletal: Normal range of motion.  Neurological: She is alert and oriented to person, place, and time.       Laughs and smiles when conversing with me.  Skin: Skin is warm and dry.       Mucous membranes appear dry.  Psychiatric: She has a normal mood and affect. Her behavior is normal.    MAU Course  Procedures  NONE  MDM Results for orders placed during the hospital encounter of 02/22/11 (from the past 24 hour(s))  URINALYSIS, ROUTINE W REFLEX MICROSCOPIC     Status: Abnormal   Collection Time   02/22/11  8:37 PM      Component Value Range   Color, Urine YELLOW  YELLOW    Appearance CLEAR  CLEAR    Specific Gravity, Urine 1.015  1.005 - 1.030    pH 8.5 (*) 5.0 - 8.0    Glucose, UA NEGATIVE  NEGATIVE (mg/dL)   Hgb urine dipstick NEGATIVE  NEGATIVE    Bilirubin Urine NEGATIVE  NEGATIVE    Ketones, ur 40 (*) NEGATIVE (mg/dL)   Protein, ur NEGATIVE  NEGATIVE (mg/dL)   Urobilinogen, UA 0.2  0.0 - 1.0 (mg/dL)   Nitrite NEGATIVE  NEGATIVE    Leukocytes, UA NEGATIVE  NEGATIVE   CBC     Status: Normal   Collection Time   02/22/11  8:48 PM      Component Value Range   WBC 9.4  4.0 - 10.5 (K/uL)   RBC 4.19  3.87 - 5.11 (MIL/uL)   Hemoglobin  12.5  12.0 - 15.0 (g/dL)   HCT 47.8  29.5 - 62.1 (%)   MCV 89.3  78.0 - 100.0 (fL)   MCH 29.8  26.0 - 34.0 (pg)   MCHC 33.4  30.0 - 36.0 (g/dL)   RDW 30.8  65.7 - 84.6 (%)   Platelets 240  150 - 400 (K/uL)  COMPREHENSIVE METABOLIC PANEL     Status: Abnormal   Collection Time   02/22/11  8:48 PM      Component Value Range   Sodium 136  135 - 145 (mEq/L)   Potassium 3.4 (*) 3.5 - 5.1 (mEq/L)  Chloride 103  96 - 112 (mEq/L)   CO2 22  19 - 32 (mEq/L)   Glucose, Bld 96  70 - 99 (mg/dL)   BUN 6  6 - 23 (mg/dL)   Creatinine, Ser 1.61  0.50 - 1.10 (mg/dL)   Calcium 9.5  8.4 - 09.6 (mg/dL)   Total Protein 7.3  6.0 - 8.3 (g/dL)   Albumin 4.3  3.5 - 5.2 (g/dL)   AST 22  0 - 37 (U/L)   ALT 15  0 - 35 (U/L)   Alkaline Phosphatase 67  39 - 117 (U/L)   Total Bilirubin 0.2 (*) 0.3 - 1.2 (mg/dL)   GFR calc non Af Amer >90  >90 (mL/min)   GFR calc Af Amer >90  >90 (mL/min)     Assessment and Plan  Nausea, vomiting 3 days post operatively.  Constipation.  Non-surgical abdomen with normal bowel sounds.  Mild dehydration. 1.  IV fluid bolus with D5 1/2 NS. 2.  Start Dulcolax suppositories in AM.  Repeat q6-8 hours.  I will communicate with patient tomorrow. 3.  Phenergan suppositories 25mg  PR q 8 hrs prn nausea.  I do not want patient to restart the scopolamine patch. 4.  Minimize narcotic use.  Has RX for motrin.  Santa Abdelrahman,M SUZANNE 02/22/2011, 9:42 PM

## 2011-09-21 ENCOUNTER — Telehealth (HOSPITAL_COMMUNITY): Payer: Self-pay | Admitting: *Deleted

## 2011-09-21 NOTE — ED Notes (Signed)
I called and left a message to call. Pt. called back and I explained that the office worked her into a busy schedule for Mon. and suggested she try to go to this appointment. She said she can't because of work.  I told her they could give her a work note. She has not been at this job very long and was waiting to here if they can do Tues. I told her I did not think they can do that, but she can call and double check.  Pt. said she will just keep her appt. on 7/10. Vassie Moselle 09/21/2011

## 2011-12-04 ENCOUNTER — Other Ambulatory Visit (HOSPITAL_COMMUNITY): Payer: Self-pay | Admitting: Internal Medicine

## 2011-12-04 DIAGNOSIS — E0590 Thyrotoxicosis, unspecified without thyrotoxic crisis or storm: Secondary | ICD-10-CM

## 2011-12-10 ENCOUNTER — Encounter (HOSPITAL_COMMUNITY)
Admission: RE | Admit: 2011-12-10 | Discharge: 2011-12-10 | Disposition: A | Discharge status: Home / Self Care | Payer: BC Managed Care – PPO | Source: Ambulatory Visit | Attending: Internal Medicine | Admitting: Internal Medicine

## 2011-12-10 DIAGNOSIS — E0590 Thyrotoxicosis, unspecified without thyrotoxic crisis or storm: Secondary | ICD-10-CM

## 2011-12-11 ENCOUNTER — Other Ambulatory Visit (HOSPITAL_COMMUNITY): Payer: 59

## 2011-12-18 ENCOUNTER — Encounter (HOSPITAL_COMMUNITY)
Admission: RE | Admit: 2011-12-18 | Discharge: 2011-12-18 | Disposition: A | Discharge status: Home / Self Care | Payer: BC Managed Care – PPO | Source: Ambulatory Visit | Attending: Internal Medicine | Admitting: Internal Medicine

## 2011-12-18 DIAGNOSIS — E213 Hyperparathyroidism, unspecified: Secondary | ICD-10-CM | POA: Insufficient documentation

## 2011-12-19 ENCOUNTER — Encounter (HOSPITAL_COMMUNITY): Payer: Self-pay

## 2011-12-19 ENCOUNTER — Encounter (HOSPITAL_COMMUNITY)
Admission: RE | Admit: 2011-12-19 | Discharge: 2011-12-19 | Disposition: A | Discharge status: Home / Self Care | Payer: BC Managed Care – PPO | Source: Ambulatory Visit | Attending: Internal Medicine | Admitting: Internal Medicine

## 2011-12-19 MED ORDER — SODIUM PERTECHNETATE TC 99M INJECTION
11.0000 | Freq: Once | INTRAVENOUS | Status: AC | PRN
  Administered 2011-12-19: 11 via INTRAVENOUS

## 2011-12-19 MED ORDER — SODIUM IODIDE I 131 CAPSULE
9.3000 | Freq: Once | INTRAVENOUS | Status: AC | PRN
  Administered 2011-12-18: 9.3 via ORAL

## 2011-12-20 ENCOUNTER — Other Ambulatory Visit (HOSPITAL_COMMUNITY): Payer: 59

## 2011-12-21 ENCOUNTER — Other Ambulatory Visit (HOSPITAL_COMMUNITY): Payer: Self-pay | Admitting: Internal Medicine

## 2011-12-21 DIAGNOSIS — E0500 Thyrotoxicosis with diffuse goiter without thyrotoxic crisis or storm: Secondary | ICD-10-CM

## 2012-01-02 ENCOUNTER — Ambulatory Visit (HOSPITAL_COMMUNITY): Payer: BC Managed Care – PPO

## 2012-04-02 DIAGNOSIS — E0500 Thyrotoxicosis with diffuse goiter without thyrotoxic crisis or storm: Secondary | ICD-10-CM

## 2012-04-02 HISTORY — DX: Thyrotoxicosis with diffuse goiter without thyrotoxic crisis or storm: E05.00

## 2012-04-13 ENCOUNTER — Encounter (HOSPITAL_BASED_OUTPATIENT_CLINIC_OR_DEPARTMENT_OTHER): Payer: Self-pay | Admitting: *Deleted

## 2012-04-13 DIAGNOSIS — Z862 Personal history of diseases of the blood and blood-forming organs and certain disorders involving the immune mechanism: Secondary | ICD-10-CM | POA: Insufficient documentation

## 2012-04-13 DIAGNOSIS — R300 Dysuria: Secondary | ICD-10-CM | POA: Insufficient documentation

## 2012-04-13 DIAGNOSIS — Y9301 Activity, walking, marching and hiking: Secondary | ICD-10-CM | POA: Insufficient documentation

## 2012-04-13 DIAGNOSIS — S301XXA Contusion of abdominal wall, initial encounter: Secondary | ICD-10-CM | POA: Insufficient documentation

## 2012-04-13 DIAGNOSIS — Y929 Unspecified place or not applicable: Secondary | ICD-10-CM | POA: Insufficient documentation

## 2012-04-13 DIAGNOSIS — Z3202 Encounter for pregnancy test, result negative: Secondary | ICD-10-CM | POA: Insufficient documentation

## 2012-04-13 DIAGNOSIS — F411 Generalized anxiety disorder: Secondary | ICD-10-CM | POA: Insufficient documentation

## 2012-04-13 DIAGNOSIS — N390 Urinary tract infection, site not specified: Secondary | ICD-10-CM | POA: Insufficient documentation

## 2012-04-13 DIAGNOSIS — Z79899 Other long term (current) drug therapy: Secondary | ICD-10-CM | POA: Insufficient documentation

## 2012-04-13 DIAGNOSIS — F909 Attention-deficit hyperactivity disorder, unspecified type: Secondary | ICD-10-CM | POA: Insufficient documentation

## 2012-04-13 DIAGNOSIS — W108XXA Fall (on) (from) other stairs and steps, initial encounter: Secondary | ICD-10-CM | POA: Insufficient documentation

## 2012-04-13 DIAGNOSIS — IMO0002 Reserved for concepts with insufficient information to code with codable children: Secondary | ICD-10-CM | POA: Insufficient documentation

## 2012-04-13 NOTE — ED Notes (Signed)
C/o lower abd pain that started this evening. Denies any n/v/d. Denies fevers. Describes as a cramping type pain that comes and goes. Denies urinary symptoms. Also, pt. States that she slipped and fell down the steps yesterday. C/o pain to her right buttocks area. Mild bruising noted to right buttocks.

## 2012-04-14 ENCOUNTER — Emergency Department (HOSPITAL_BASED_OUTPATIENT_CLINIC_OR_DEPARTMENT_OTHER)
Admission: EM | Admit: 2012-04-14 | Discharge: 2012-04-14 | Disposition: A | Discharge status: Home / Self Care | Payer: BC Managed Care – PPO | Attending: Emergency Medicine | Admitting: Emergency Medicine

## 2012-04-14 ENCOUNTER — Emergency Department (HOSPITAL_BASED_OUTPATIENT_CLINIC_OR_DEPARTMENT_OTHER): Payer: BC Managed Care – PPO

## 2012-04-14 DIAGNOSIS — N390 Urinary tract infection, site not specified: Secondary | ICD-10-CM

## 2012-04-14 DIAGNOSIS — T148XXA Other injury of unspecified body region, initial encounter: Secondary | ICD-10-CM

## 2012-04-14 LAB — CBC WITH DIFFERENTIAL/PLATELET
Eosinophils Absolute: 0 10*3/uL (ref 0.0–0.7)
HCT: 35.7 % — ABNORMAL LOW (ref 36.0–46.0)
Hemoglobin: 12.3 g/dL (ref 12.0–15.0)
Lymphs Abs: 0.8 10*3/uL (ref 0.7–4.0)
MCH: 30.8 pg (ref 26.0–34.0)
MCHC: 34.5 g/dL (ref 30.0–36.0)
MCV: 89.3 fL (ref 78.0–100.0)
Monocytes Absolute: 0.5 10*3/uL (ref 0.1–1.0)
Monocytes Relative: 4 % (ref 3–12)
Neutrophils Relative %: 89 % — ABNORMAL HIGH (ref 43–77)
RBC: 4 MIL/uL (ref 3.87–5.11)

## 2012-04-14 LAB — BASIC METABOLIC PANEL
BUN: 16 mg/dL (ref 6–23)
Creatinine, Ser: 0.7 mg/dL (ref 0.50–1.10)
GFR calc non Af Amer: >90 mL/min (ref >90)
Glucose, Bld: 116 mg/dL — ABNORMAL HIGH (ref 70–99)
Potassium: 4.7 mEq/L (ref 3.5–5.1)

## 2012-04-14 LAB — URINALYSIS, ROUTINE W REFLEX MICROSCOPIC
Nitrite: POSITIVE — AB
Specific Gravity, Urine: 1.041 — ABNORMAL HIGH (ref 1.005–1.030)
pH: 5.5 (ref 5.0–8.0)

## 2012-04-14 LAB — URINE MICROSCOPIC-ADD ON

## 2012-04-14 MED ORDER — CIPROFLOXACIN HCL 500 MG PO TABS
500.0000 mg | ORAL_TABLET | Freq: Once | ORAL | Status: AC
  Administered 2012-04-14: 500 mg via ORAL
  Filled 2012-04-14: qty 1

## 2012-04-14 MED ORDER — CIPROFLOXACIN HCL 500 MG PO TABS: 500.0000 mg | ORAL_TABLET | Freq: Two times a day (BID) | ORAL | Status: DC

## 2012-04-14 MED ORDER — SODIUM CHLORIDE 0.9 % IV SOLN
Freq: Once | INTRAVENOUS | Status: AC
  Administered 2012-04-14: 03:00:00 via INTRAVENOUS

## 2012-04-14 MED ORDER — HYDROCODONE-ACETAMINOPHEN 5-325 MG PO TABS: 1.0000 | ORAL_TABLET | Freq: Four times a day (QID) | ORAL | Status: DC | PRN

## 2012-04-14 MED ORDER — METHOCARBAMOL 500 MG PO TABS
500.0000 mg | ORAL_TABLET | Freq: Once | ORAL | Status: AC
  Administered 2012-04-14: 500 mg via ORAL
  Filled 2012-04-14: qty 1

## 2012-04-14 MED ORDER — OXYCODONE-ACETAMINOPHEN 5-325 MG PO TABS
1.0000 | ORAL_TABLET | Freq: Once | ORAL | Status: AC
  Administered 2012-04-14: 1 via ORAL
  Filled 2012-04-14 (×2): qty 1

## 2012-04-14 NOTE — ED Notes (Signed)
MD with pt  

## 2012-04-14 NOTE — ED Notes (Signed)
Transported to CT 

## 2012-04-14 NOTE — ED Notes (Signed)
Returned from CT.

## 2012-04-14 NOTE — ED Provider Notes (Signed)
History     CSN: [id number]  Arrival date & time 04/13/12  2331   First MD Initiated Contact with Patient 04/14/12 0055      Chief Complaint  Patient presents with  . Abdominal Pain    (Consider location/radiation/quality/duration/timing/severity/associated sxs/prior treatment) Patient is a 27 y.o. female presenting with abdominal pain and fall. The history is provided by the patient.  Abdominal Pain The primary symptoms of the illness include abdominal pain and dysuria. The primary symptoms of the illness do not include fever, fatigue, shortness of breath, nausea, vomiting or vaginal discharge. The current episode started yesterday. The onset of the illness was sudden. The problem has not changed since onset. The abdominal pain began yesterday. The pain came on suddenly. The abdominal pain has been unchanged since its onset. The abdominal pain is located in the suprapubic region. The abdominal pain does not radiate. The severity of the abdominal pain is 10/10. The abdominal pain is relieved by nothing.  The patient states that she believes she is currently not pregnant. The patient has not had a change in bowel habit.  Fall The accident occurred yesterday. The fall occurred while walking. Distance fallen: onto [place] on stairs.  No head injury no LOC.   There was no blood loss. Point of impact: right buttock and hip. The pain is at a severity of 10/10. The pain is severe. Associated symptoms include abdominal pain. Pertinent negatives include no fever, no nausea and no vomiting. The symptoms are aggravated by activity. She has tried nothing for the symptoms. The treatment provided no relief.    Past Medical History  Diagnosis Date  . Anemia     iron  . Back pain   . Anxiety   . ADD (attention deficit disorder with hyperactivity)   . Pelvic pain in female   . Hyperthyroidism     Past Surgical History  Procedure Date  . Wisdom tooth extraction   . Gum grafts   . Laparoscopy  02/19/2011    Procedure: LAPAROSCOPY DIAGNOSTIC;  Surgeon: Lum Keas;  Location: [facility];  Service: Gynecology;  Laterality: N/A;  Diagnostic Laparoscopy    No family history on file.  History  Substance Use Topics  . Smoking status: Never Smoker   . Smokeless tobacco: Never Used  . Alcohol Use: No    OB History    Grav Para Term Preterm Abortions TAB SAB Ect Mult Living                  Review of Systems  Constitutional: Negative for fever and fatigue.  Respiratory: Negative for shortness of breath.   Gastrointestinal: Positive for abdominal pain. Negative for nausea and vomiting.  Genitourinary: Positive for dysuria. Negative for vaginal discharge.  All other systems reviewed and are negative.    Allergies  Penicillins; Codeine; Milk-related compounds; and Sulfonamide derivatives  Home Medications   Current Outpatient Rx  Name  Route  Sig  Dispense  Refill  . B COMPLEX PO TABS   Oral   Take 1 tablet by mouth daily.          Marland Kitchen CALCIUM CARBONATE PO   Oral   Take 1 tablet by mouth daily.           Marland Kitchen VITAMIN D 1000 UNITS PO TABS   Oral   Take 2,000 Units by mouth daily.          Marland Kitchen CIPROFLOXACIN HCL 500 MG PO TABS   Oral   Take  1 tablet (500 mg total) by mouth 2 (two) times daily. One po bid x 7 days   14 tablet   0   . IMPLANON [place]   Subcutaneous   Inject into the skin.          Marland Kitchen HYDROCODONE-ACETAMINOPHEN 5-325 MG PO TABS   Oral   Take 1 tablet by mouth every 6 (six) hours as needed for pain.   10 tablet   0   . IBUPROFEN 800 MG PO TABS   Oral   Take 800 mg by mouth every 8 (eight) hours as needed. For pain          . LISDEXAMFETAMINE DIMESYLATE 20 MG PO CAPS   Oral   Take 20 mg by mouth every morning.           Marland Kitchen MAGNESIUM OXIDE 400 MG PO TABS   Oral   Take 400 mg by mouth daily.           . MULTI-VITAMIN/MINERALS PO TABS   Oral   Take 1 tablet by mouth daily.           . OXYCODONE-ACETAMINOPHEN 5-325 MG PO TABS    Oral   Take 1 tablet by mouth every 4 (four) hours as needed. For pain            BP 126/78  Pulse 79  Temp 97.8 F (36.6 C) (Oral)  Resp 18  Ht 5\' 5"  (1.651 m)  Wt 130 lb (58.968 kg)  BMI 21.63 kg/m2  SpO2 98%  LMP 04/14/2012  Physical Exam  Constitutional: She is oriented to person, place, and time. She appears well-developed and well-nourished. No distress.  HENT:  Head: Normocephalic and atraumatic.  Right Ear: No mastoid tenderness. No hemotympanum.  Left Ear: No mastoid tenderness. No hemotympanum.  Mouth/Throat: Oropharynx is clear and moist.  Eyes: Conjunctivae normal and EOM are normal. Pupils are equal, round, and reactive to light.  Neck: Normal range of motion. Neck supple.  Cardiovascular: Normal rate, regular rhythm and intact distal pulses.   Pulmonary/Chest: Breath sounds normal. She has no wheezes. She has no rales.  Abdominal: Soft. Bowel sounds are normal. There is no tenderness. There is no rebound and no guarding.  Musculoskeletal: Normal range of motion. She exhibits no edema and no tenderness.       Negative anterior and posterior drawer tests of b knees no snuff box tenderness of either wrist.    Neurological: She is alert and oriented to person, place, and time. She has normal reflexes.  Skin:       Ecchymosis of the right buttock  Psychiatric: She has a normal mood and affect.    ED Course  Procedures (including critical care time)  Labs Reviewed  URINALYSIS, ROUTINE W REFLEX MICROSCOPIC - Abnormal; Notable for the following:    Color, Urine AMBER (*)  BIOCHEMICALS MAY BE AFFECTED BY COLOR   Specific Gravity, Urine 1.041 (*)     Bilirubin Urine SMALL (*)     Ketones, ur 15 (*)     Nitrite POSITIVE (*)     Leukocytes, UA SMALL (*)     All other components within normal limits  URINE MICROSCOPIC-ADD ON - Abnormal; Notable for the following:    Squamous Epithelial / LPF FEW (*)     Bacteria, UA FEW (*)     All other components within normal  limits  CBC WITH DIFFERENTIAL - Abnormal; Notable for the following:    WBC 12.6 (*)  HCT 35.7 (*)     Neutrophils Relative 89 (*)     Neutro Abs 11.2 (*)     Lymphocytes Relative 6 (*)     All other components within normal limits  BASIC METABOLIC PANEL - Abnormal; Notable for the following:    Glucose, Bld 116 (*)     All other components within normal limits  PREGNANCY, URINE  URINE CULTURE   Ct Abdomen Pelvis Wo Contrast  04/14/2012  *RADIOLOGY REPORT*  Clinical Data: Right lower quadrant and low back pain for 2 days. Fell yesterday.  CT ABDOMEN AND PELVIS WITHOUT CONTRAST  Technique:  Multidetector CT imaging of the abdomen and pelvis was performed following the standard protocol without intravenous contrast.  Comparison: None.  Findings: The lung bases are clear.  The kidneys appear symmetrical in size and shape.  No pyelocaliectasis or ureterectasis.  No renal, ureteral, or bladder stones.  The bladder is decompressed and cannot be evaluated for wall thickening.  The unenhanced appearance of the liver, spleen, gallbladder, pancreas, adrenal glands, abdominal aorta, and retroperitoneal lymph nodes is unremarkable.  The stomach, small bowel, and colon are mostly decompressed.  No free air or free fluid in the abdomen.  Pelvis:  The uterus is not enlarged.  The ovaries appear symmetrical.  No free or loculated pelvic fluid collections.  No diverticulitis.  The appendix is normal.  Normal alignment of the lumbar vertebrae.  Lumbar spine, pelvis, and hips appear intact.  IMPRESSION: No renal or ureteral stone or obstruction.  No acute process demonstrated in the abdomen or pelvis on unenhanced imaging.   Original Report Authenticated By: Burman Nieves, M.D.      1. Contusion   2. Muscle strain   3. UTI (lower urinary tract infection)       MDM  UTI with MSK pain of the flank secondary to fall.  Will treat for UTI and pain.  Follow up with your family doctor for ongoing  care       Lance Galas K Nikkia Devoss-Rasch, MD 04/14/12 [phone]

## 2012-04-14 NOTE — ED Notes (Signed)
Patient unable to void at this time. RN aware. 

## 2012-04-15 LAB — URINE CULTURE

## 2012-09-09 ENCOUNTER — Encounter: Payer: Self-pay | Admitting: Obstetrics & Gynecology

## 2012-09-12 ENCOUNTER — Ambulatory Visit (INDEPENDENT_AMBULATORY_CARE_PROVIDER_SITE_OTHER): Payer: BC Managed Care – PPO | Admitting: Obstetrics & Gynecology

## 2012-09-12 ENCOUNTER — Encounter: Payer: Self-pay | Admitting: Obstetrics & Gynecology

## 2012-09-12 VITALS — BP 102/68 | HR 70 | Resp 16 | Ht 64.5 in | Wt 127.2 lb

## 2012-09-12 DIAGNOSIS — Z01419 Encounter for gynecological examination (general) (routine) without abnormal findings: Secondary | ICD-10-CM

## 2012-09-12 DIAGNOSIS — Z124 Encounter for screening for malignant neoplasm of cervix: Secondary | ICD-10-CM

## 2012-09-12 NOTE — Progress Notes (Signed)
27 y.o. Caucasian female G0P0 here for annual exam. Pt is not currently sexually active.  She reports  using condoms on a regular basis.  First sexual activity at 27 years old, 5 number of lifetime partners.     Dad and Mom are divorcing.  She and mother are moving into a town home.  Lots of stress.  She has a new job.    No cycle since December.  Using Implanon.  Does get breast tenderness about every four weeks--PMS like symptoms--but that it all.  Place 01/25/11.    Went to ER 1/14 due to severe UTI.  She fell down stairs and she wasn't sure if she had a concussion but her problem was a UTI.  She was put on antibiotics and was much better in 24 hours.    Diagnosed with Grave's disease in the last year.  Followed by Dr. Evlyn Kanner.  Having blood work every three months.  Has follow-up in early July.    No LMP recorded. Patient has had an implant.          Sexually active: no  The current method of family planning is implant.    Exercising: yes  Gym/ health club routine includes cardio. Last pap:07/31/11 normal Alcohol: no Tobacco:no Drugs:no Gardisil: Yes completed age 27  Family History  Problem Relation Age of Onset  . Heart murmur Brother     unsure of extend but will require surgery    Patient Active Problem List   Diagnosis Date Noted  . Pelvic pain in female 02/19/2011  . ANXIETY 12/24/2007  . NAUSEA AND VOMITING 12/24/2007  . DIARRHEA 12/24/2007  . ABDOMINAL PAIN, EPIGASTRIC 12/24/2007    Past Medical History  Diagnosis Date  . Anemia     iron  . Back pain   . Anxiety   . ADD (attention deficit disorder with hyperactivity)   . Pelvic pain in female   . Hyperthyroidism   . Graves disease     Past Surgical History  Procedure Laterality Date  . Wisdom tooth extraction    . Gum grafts    . Laparoscopy  02/19/2011    Procedure: LAPAROSCOPY DIAGNOSTIC;  Surgeon: Lum Keas;  Location: [facility];  Service: Gynecology;  Laterality: N/A;  Diagnostic Laparoscopy     Allergies: Penicillins; Codeine; Milk-related compounds; and Sulfonamide derivatives  Current Outpatient Prescriptions  Medication Sig Dispense Refill  . b complex vitamins tablet Take 1 tablet by mouth daily.       Marland Kitchen CALCIUM CARBONATE PO Take 1 tablet by mouth daily.        . cholecalciferol (VITAMIN D) 1000 UNITS tablet Take 2,000 Units by mouth daily.       . ciprofloxacin (CIPRO) 500 MG tablet Take 1 tablet (500 mg total) by mouth 2 (two) times daily. One po bid x 7 days  14 tablet  0  . Etonogestrel (IMPLANON [place]) Inject into the skin.       Marland Kitchen HYDROcodone-acetaminophen (NORCO/VICODIN) 5-325 MG per tablet Take 1 tablet by mouth every 6 (six) hours as needed for pain.  10 tablet  0  . ibuprofen (ADVIL,MOTRIN) 800 MG tablet Take 800 mg by mouth every 8 (eight) hours as needed. For pain       . lisdexamfetamine (VYVANSE) 20 MG capsule Take 20 mg by mouth every morning.        . magnesium oxide (MAG-OX) 400 MG tablet Take 400 mg by mouth daily.        Marland Kitchen  Multiple Vitamins-Minerals (MULTIVITAMIN WITH MINERALS) tablet Take 1 tablet by mouth daily.        Marland Kitchen oxyCODONE-acetaminophen (PERCOCET) 5-325 MG per tablet Take 1 tablet by mouth every 4 (four) hours as needed. For pain        No current facility-administered medications for this visit.    ROS: A comprehensive review of systems was negative.  Exam:    BP 102/68  Pulse 70  Resp 16  Ht 5' 4.5" (1.638 m)  Wt 127 lb 3.2 oz (57.698 kg)  BMI 21.5 kg/m2 Weight change: +3lb Ht Readings from Last 3 Encounters:  09/12/12 5' 4.5" (1.638 m)  04/13/12 5\' 5"  (1.651 m)  02/22/11 5\' 5"  (1.651 m)   General appearance: alert, cooperative and appears stated age Head: Normocephalic, without obvious abnormality, atraumatic Neck: no adenopathy, no carotid bruit, no JVD, supple, symmetrical, trachea midline and thyroid not enlarged, symmetric, no tenderness/mass/nodules Lungs: clear to auscultation bilaterally Breasts: normal appearance, no  masses or tenderness Heart: regular rate and rhythm, S1, S2 normal, no murmur, click, rub or gallop Abdomen: soft, non-tender; bowel sounds normal; no masses,  no organomegaly Extremities: extremities normal, atraumatic, no cyanosis or edema Skin: Skin color, texture, turgor normal. No rashes or lesions Lymph nodes: Cervical, supraclavicular, and axillary nodes normal. no inguinal nodes palpated Neurologic: Grossly normal   Pelvic: External genitalia:  no lesions              Urethra: not indicated and normal appearing urethra with no masses, tenderness or lesions              Bartholins and Skenes: normal                 Vagina: normal appearing vagina with normal color and discharge, no lesions              Cervix: normal appearance and s/p leep              Pap taken: yes        Bimanual Exam:  Uterus:  uterus is normal size, shape, consistency and nontender                                      Adnexa:    normal adnexa in size, nontender and no masses                                      Rectovaginal: Deferred                                      Anus:  No lesions  A: Well woman H/O abnormal Pap smears, neg HR HPV 10/12  P: Using Implanon for B.C. Although not sexually active in >1 yr.  Will want new one next year. Pap today.      An After Visit Summary was printed and given to the patient.

## 2012-09-12 NOTE — Patient Instructions (Signed)

## 2012-09-17 ENCOUNTER — Telehealth: Payer: Self-pay

## 2012-09-17 NOTE — Telephone Encounter (Signed)
6/18 [email or web address]

## 2012-09-18 NOTE — Telephone Encounter (Signed)
Patient notified of pap results. Appointment for next year has been scheduled.

## 2013-03-10 ENCOUNTER — Other Ambulatory Visit: Payer: Self-pay | Admitting: Emergency Medicine

## 2013-03-10 MED ORDER — AMPHETAMINE-DEXTROAMPHETAMINE 20 MG PO TABS: 20.0000 mg | ORAL_TABLET | Freq: Two times a day (BID) | ORAL | Status: DC

## 2013-05-22 ENCOUNTER — Other Ambulatory Visit: Payer: Self-pay | Admitting: Physician Assistant

## 2013-05-25 ENCOUNTER — Other Ambulatory Visit: Payer: Self-pay | Admitting: Physician Assistant

## 2013-06-07 DIAGNOSIS — E559 Vitamin D deficiency, unspecified: Secondary | ICD-10-CM | POA: Insufficient documentation

## 2013-06-07 DIAGNOSIS — K219 Gastro-esophageal reflux disease without esophagitis: Secondary | ICD-10-CM | POA: Insufficient documentation

## 2013-06-07 DIAGNOSIS — E0500 Thyrotoxicosis with diffuse goiter without thyrotoxic crisis or storm: Secondary | ICD-10-CM | POA: Insufficient documentation

## 2013-06-07 DIAGNOSIS — E538 Deficiency of other specified B group vitamins: Secondary | ICD-10-CM | POA: Insufficient documentation

## 2013-06-07 DIAGNOSIS — F909 Attention-deficit hyperactivity disorder, unspecified type: Secondary | ICD-10-CM | POA: Insufficient documentation

## 2013-06-07 DIAGNOSIS — D649 Anemia, unspecified: Secondary | ICD-10-CM | POA: Insufficient documentation

## 2013-06-07 DIAGNOSIS — F419 Anxiety disorder, unspecified: Secondary | ICD-10-CM | POA: Insufficient documentation

## 2013-06-07 DIAGNOSIS — F988 Other specified behavioral and emotional disorders with onset usually occurring in childhood and adolescence: Secondary | ICD-10-CM | POA: Insufficient documentation

## 2013-06-07 NOTE — Progress Notes (Signed)
This encounter was created in error - please disregard.

## 2013-06-08 ENCOUNTER — Ambulatory Visit (INDEPENDENT_AMBULATORY_CARE_PROVIDER_SITE_OTHER): Payer: BC Managed Care – PPO | Admitting: Emergency Medicine

## 2013-06-08 ENCOUNTER — Encounter: Payer: Self-pay | Admitting: Emergency Medicine

## 2013-06-08 VITALS — BP 124/70 | HR 80 | Temp 98.6°F | Resp 18 | Ht 65.0 in | Wt 134.0 lb

## 2013-06-08 DIAGNOSIS — E538 Deficiency of other specified B group vitamins: Secondary | ICD-10-CM

## 2013-06-08 DIAGNOSIS — F988 Other specified behavioral and emotional disorders with onset usually occurring in childhood and adolescence: Secondary | ICD-10-CM

## 2013-06-08 MED ORDER — AMPHETAMINE-DEXTROAMPHETAMINE 30 MG PO TABS: 30.0000 mg | ORAL_TABLET | Freq: Every day | ORAL | Status: DC

## 2013-06-08 MED ORDER — ONDANSETRON 4 MG PO TBDP: 4.0000 mg | ORAL_TABLET | Freq: Three times a day (TID) | ORAL | Status: DC | PRN

## 2013-06-08 MED ORDER — CYANOCOBALAMIN 1000 MCG/ML IJ SOLN: 1000.0000 ug | INTRAMUSCULAR | Status: DC

## 2013-06-08 NOTE — Patient Instructions (Signed)
Vitamin B12 Deficiency Not having enough vitamin B12 is called a deficiency. Your body needs this vitamin for important body functions. HOME CARE  Take all vitamins, herbs, or nutrition drinks (supplements) as told by your doctor.  Get shots (injections) as told. Do not miss your doctor visit.  Eat foods than contain vitamin B12. This includes:  Meat.  Chicken, [place], or other birds (poultry).  Fish.  Eggs.  Cereals or milk with added vitamin B12. Check the label.  Do not drink too much (abuse) alcohol.  Keep all doctor visits as told. GET HELP IF:  You have questions.  Your problems come back. MAKE SURE YOU:  Understand these instructions.  Will watch your condition.  Will get help right away if you are not doing well or get worse. Document Released: 03/08/2011 Document Revised: 06/11/2011 Document Reviewed: 03/08/2011 [facility] Patient Information 2014 [place], [place]. Attention Deficit Hyperactivity Disorder Attention deficit hyperactivity disorder (ADHD) is a problem with behavior issues based on the way the brain functions (neurobehavioral disorder). It is a common reason for behavior and academic problems in school. SYMPTOMS  There are 3 types of ADHD. The 3 types and some of the symptoms include:  Inattentive  Gets bored or distracted easily.  Loses or forgets things. Forgets to hand in homework.  Has trouble organizing or completing tasks.  Difficulty staying on task.  An inability to organize daily tasks and school work.  Leaving projects, chores, or homework unfinished.  Trouble paying attention or responding to details. Careless mistakes.  Difficulty following directions. Often seems like is not listening.  Dislikes activities that require sustained attention (like chores or homework).  Hyperactive-impulsive  Feels like it is impossible to sit still or stay in a seat. Fidgeting with hands and feet.  Trouble waiting turn.  Talking too much  or out of turn. Interruptive.  Speaks or acts impulsively.  Aggressive, disruptive behavior.  Constantly busy or on the go, noisy.  Often leaves seat when they are expected to remain seated.  Often runs or climbs where it is not appropriate, or feels very restless.  Combined  Has symptoms of both of the above. Often children with ADHD feel discouraged about themselves and with school. They often perform well below their abilities in school. As children get older, the excess motor activities can calm down, but the problems with paying attention and staying organized persist. Most children do not outgrow ADHD but with good treatment can learn to cope with the symptoms. DIAGNOSIS  When ADHD is suspected, the diagnosis should be made by professionals trained in ADHD. This professional will collect information about the individual suspected of having ADHD. Information must be collected from various settings where the person lives, works, or attends school.  Diagnosis will include:  Confirming symptoms began in childhood.  Ruling out other reasons for the child's behavior.  The health care providers will check with the child's school and check their medical records.  They will talk to teachers and parents.  Behavior rating scales for the child will be filled out by those dealing with the child on a daily basis. A diagnosis is made only after all information has been considered. TREATMENT  Treatment usually includes behavioral treatment, tutoring or extra support in school, and stimulant medicines. Because of the way a person's brain works with ADHD, these medicines decrease impulsivity and hyperactivity and increase attention. This is different than how they would work in a person who does not have ADHD. Other medicines used include  antidepressants and certain blood pressure medicines. Most experts agree that treatment for ADHD should address all aspects of the person's functioning. Along  with medicines, treatment should include structured classroom management at school. Parents should reward good behavior, provide constant discipline, and limit-setting. Tutoring should be available for the child as needed. ADHD is a life-long condition. If untreated, the disorder can have long-term serious effects into adolescence and adulthood. HOME CARE INSTRUCTIONS   Often with ADHD there is a lot of frustration among family members dealing with the condition. Blame and anger are also feelings that are common. In many cases, because the problem affects the family as a whole, the entire family may need help. A therapist can help the family find better ways to handle the disruptive behaviors of the person with ADHD and promote change. If the person with ADHD is young, most of the therapist's work is with the parents. Parents will learn techniques for coping with and improving their child's behavior. Sometimes only the child with the ADHD needs counseling. Your health care providers can help you make these decisions.  Children with ADHD may need help learning how to organize. Some helpful tips include:  Keep routines the same every day from wake-up time to bedtime. Schedule all activities, including homework and playtime. Keep the schedule in a place where the person with ADHD will often see it. Mark schedule changes as far in advance as possible.  Schedule outdoor and indoor recreation.  Have a place for everything and keep everything in its place. This includes clothing, backpacks, and school supplies.  Encourage writing down assignments and bringing home needed books. Work with your child's teachers for assistance in organizing school work.  Offer your child a well-balanced diet. Breakfast that includes a balance of whole grains, protein and, fruits or vegetables is especially important for school performance. Children should avoid drinks with caffeine including:  Soft  drinks.  Coffee.  Tea.  However, some older children (adolescents) may find these drinks helpful in improving their attention. Because it can also be common for adolescents with ADHD to become addicted to caffeine, talk with your health care provider about what is a safe amount of caffeine intake for your child.  Children with ADHD need consistent rules that they can understand and follow. If rules are followed, give small rewards. Children with ADHD often receive, and expect, criticism. Look for good behavior and praise it. Set realistic goals. Give clear instructions. Look for activities that can foster success and self-esteem. Make time for pleasant activities with your child. Give lots of affection.  Parents are their children's greatest advocates. Learn as much as possible about ADHD. This helps you become a stronger and better advocate for your child. It also helps you educate your child's teachers and instructors if they feel inadequate in these areas. Parent support groups are often helpful. A national group with local chapters is called Children and Adults with Attention Deficit Hyperactivity Disorder (CHADD). SEEK MEDICAL CARE IF:  Your child has repeated muscle twitches, cough or speech outbursts.  Your child has sleep problems.  Your child has a marked loss of appetite.  Your child develops depression.  Your child has new or worsening behavioral problems.  Your child develops dizziness.  Your child has a racing heart.  Your child has stomach pains.  Your child develops headaches. SEEK IMMEDIATE MEDICAL CARE IF:  Your child has been diagnosed with depression or anxiety and the symptoms seem to be getting worse.  Your  child has been depressed and suddenly appears to have increased energy or motivation.  You are worried that your child is having a bad reaction to a medication he or she is taking for ADHD. Document Released: 03/09/2002 Document Revised: 01/07/2013  Document Reviewed: 11/24/2012 [facility] Patient Information 2014 [place], [place].

## 2013-06-08 NOTE — Progress Notes (Signed)
   Subjective:    Patient ID: Sara Brewer, female    DOB: 27-Nov-1985, 28 y.o.   MRN: [id number]  HPI Comments: 28 yo female wants to discuss RX change with Adderall. She notes wears off about 2 pm but if takes at 2-3 pm stays awake at night. She cannot take extended release due to dry mouth. She notes only takes medication at work and helps with focus.  She is doing Reiki which has helped with anxiety and is off all RX for anxiety/ depression and doing well.   She has been off RX B12 injection x several months. She was on 1 cc month and needs refilled. She notes + improvement with energy when she's on RX.   Current Outpatient Prescriptions on File Prior to Visit  Medication Sig Dispense Refill  . amphetamine-dextroamphetamine (ADDERALL) 20 MG tablet Take 1 tablet (20 mg total) by mouth 2 (two) times daily.  60 tablet  0  . Etonogestrel (IMPLANON [place]) Inject into the skin.       . Multiple Vitamins-Minerals (MULTIVITAMIN WITH MINERALS) tablet Take 1 tablet by mouth daily.         No current facility-administered medications on file prior to visit.   Allergies  Allergen Reactions  . Penicillins Anaphylaxis  . Codeine Nausea And Vomiting  . Milk-Related Compounds Hives and Nausea And Vomiting  . Sulfonamide Derivatives Nausea And Vomiting   Past Medical History  Diagnosis Date  . Back pain   . ADD (attention deficit disorder with hyperactivity)   . Pelvic pain in female   . Graves disease 2014    Dr. [facility]   . GERD (gastroesophageal reflux disease)   . Anemia     iron  . Anxiety   . Vitamin B12 deficiency   . Vitamin D deficiency      Review of Systems  Constitutional: Positive for fatigue.  All other systems reviewed and are negative.   BP 124/70  Pulse 80  Temp(Src) 98.6 F (37 C) (Temporal)  Resp 18  Ht 5\' 5"  (1.651 m)  Wt 134 lb (60.782 kg)  BMI 22.30 kg/m2     Objective:   Physical Exam  Nursing note and vitals reviewed. Constitutional: She is  oriented to person, place, and time. She appears well-developed and well-nourished.  HENT:  Head: Normocephalic and atraumatic.  Eyes: Conjunctivae are normal.  Neck: Normal range of motion.  Cardiovascular: Normal rate, regular rhythm, normal heart sounds and intact distal pulses.   Pulmonary/Chest: Effort normal and breath sounds normal.  Abdominal: Soft. Bowel sounds are normal. She exhibits no distension and no mass. There is no tenderness. There is no rebound and no guarding.  Musculoskeletal: Normal range of motion.  Neurological: She is alert and oriented to person, place, and time.  Skin: Skin is warm and dry.  Psychiatric: She has a normal mood and affect. Judgment normal.          Assessment & Plan:  1. ADD- Change RX due to cost and duration/ SE of medicine- Trial of 30 mg 1/2 at 8 am and 1/2 at 12.  w/c if SX increase or ER. 2. B12 Def- Off rx needs refill  Advised Overdue for CPE will try to get scheduled ASAP to check labs

## 2013-06-24 ENCOUNTER — Encounter: Payer: Self-pay | Admitting: Emergency Medicine

## 2013-06-24 ENCOUNTER — Ambulatory Visit (INDEPENDENT_AMBULATORY_CARE_PROVIDER_SITE_OTHER): Payer: BC Managed Care – PPO | Admitting: Emergency Medicine

## 2013-06-24 VITALS — BP 116/68 | HR 74 | Temp 98.2°F | Resp 18 | Wt 132.0 lb

## 2013-06-24 DIAGNOSIS — Z79899 Other long term (current) drug therapy: Secondary | ICD-10-CM

## 2013-06-24 DIAGNOSIS — Z0000 Encounter for general adult medical examination without abnormal findings: Secondary | ICD-10-CM

## 2013-06-24 DIAGNOSIS — E559 Vitamin D deficiency, unspecified: Secondary | ICD-10-CM

## 2013-06-24 LAB — CBC WITH DIFFERENTIAL/PLATELET
BASOS PCT: 1 % (ref 0–1)
Basophils Absolute: 0.1 10*3/uL (ref 0.0–0.1)
EOS ABS: 0.1 10*3/uL (ref 0.0–0.7)
Eosinophils Relative: 2 % (ref 0–5)
HEMATOCRIT: 39.2 % (ref 36.0–46.0)
HEMOGLOBIN: 13.5 g/dL (ref 12.0–15.0)
LYMPHS ABS: 2.2 10*3/uL (ref 0.7–4.0)
Lymphocytes Relative: 34 % (ref 12–46)
MCH: 29.9 pg (ref 26.0–34.0)
MCHC: 34.4 g/dL (ref 30.0–36.0)
MCV: 86.7 fL (ref 78.0–100.0)
MONO ABS: 0.5 10*3/uL (ref 0.1–1.0)
MONOS PCT: 7 % (ref 3–12)
Neutro Abs: 3.7 10*3/uL (ref 1.7–7.7)
Neutrophils Relative %: 56 % (ref 43–77)
Platelets: 307 10*3/uL (ref 150–400)
RBC: 4.52 MIL/uL (ref 3.87–5.11)
RDW: 13.1 % (ref 11.5–15.5)
WBC: 6.6 10*3/uL (ref 4.0–10.5)

## 2013-06-24 LAB — HEMOGLOBIN A1C
Hgb A1c MFr Bld: 5.3 % (ref <5.7)
Mean Plasma Glucose: 105 mg/dL (ref <117)

## 2013-06-24 NOTE — Patient Instructions (Signed)
FYI Melanoma Melanoma is the least common, but most dangerous, form of skin cancer. This is because it can spread (metastasize) to other organs and can be life-threatening. Melanoma is a cancerous (malignant) tumor that begins in a certain type of cells, called melanocytes. Melanocytes are the cells that produce the color (pigment) called melanin. Melanin colors our skin, hair, eyes, and moles. CAUSES  The exact cause of melanoma is unknown. You may have a higher risk if you:  Spend or have spent a lot of time in the sun. This includes sunlamp and tanning booth exposure.  Have had sunburns. This put you at a particularly increased risk for melanoma. The more blistering sunburns a person has, the higher the risk.  Spend time in parts of the world with more intense sunlight.  Have fair skin that does not tan easily. You may have a lower risk if you have a darker skin color. However, people with darker skin can get melanoma, especially on the hands and feet (acral areas).  Have a close relative (parent, sibling) who has melanoma.  Have a large number of skin moles (more than 100). SYMPTOMS  A skin mole is suspicious if it has any of these 5 traits. This is called the ABCDE's of melanoma:  Asymmetry: Irregular shape, not simply round or oval.  Border: Edge of the mole is irregular, not smooth.  Color: Mole may have multiple colors in it, including brown, black, blue, red, or tan.  Diameter: More than 0.2 inches (6 mm) across.  Evolving: Any unusual change or symptoms in the mole, such as pain, itching, stinging, sensitivity, or bleeding. A mole that is noticeably changing in appearance, or any new mole, should be checked for melanoma. In general, people develop new moles until age 28. New moles after this age should be brought to the attention of your caregiver. DIAGNOSIS  Your caregiver can look at your skin and find lesions or moles that may be suspicious. A patient may also notice a mole  with symptoms or a mole that does not look like most of the other moles on his or her body. This is called the "ugly duckling" sign. A tissue sample (biopsy) examined under a microscope is needed to determine if it is melanoma. The size and extent of the biopsy will depend on the location, size, and appearance of the skin lesion or mole. The biopsy can also reveal whether melanoma has spread to deeper layers of the skin. TREATMENT  Surgery to completely remove the melanoma is required. Lymph nodes may also be removed. If the melanoma has spread to other organs, such as the liver, lungs, bone, or brain, cancer-fighting drugs (chemotherapy) must be used. Your caregiver will discuss your treatment options with you. You can ask about being included in a clinical trial to evaluate new forms of treatment. Melanoma can occasionally recur years after the initial diagnosis. If you have melanoma, you will need follow-up visits with your caregiver for many years. PREVENTION  Risk for melanoma can be reduced by minimizing sun exposure. Practice the 3 S's:  Slip on a shirt.  Slop on sunscreen.  Slap on a hat. Do not spend time in the sun during peak midafternoon hours. Sunscreen/sunblock with SPF 30 or higher and UVA/UVB block should be applied regularly. You should do this even during brief exposure to sunlight. You should also do this on cloudy days and in winter, even though the perceived sunlight is less. Always avoid sunburn! Wear sunglasses that block  UV light. Be sure to see your caregiver if you have any new or changing moles. HOME CARE INSTRUCTIONS   Follow wound care instructions after surgical removal of your melanoma.  Practice good sun avoidance and protective measures as described above.  Let your close family members (parents, children, siblings) know about your diagnosis. This puts them at a higher risk of getting melanoma than the general population. SEEK MEDICAL CARE IF:   You notice any  new moles, or you have any moles that are changing.  You have had a melanoma removed and you notice a new growth near the same location.  You have had a melanoma removed and you experience any new or unexplained health problems. Document Released: 03/19/2005 Document Revised: 06/11/2011 Document Reviewed: 07/08/2009 [facility] Patient Information 2014 [place], [place]. Anemia, Nonspecific Anemia is a condition in which the concentration of red blood cells or hemoglobin in the blood is below normal. Hemoglobin is a substance in red blood cells that carries oxygen to the tissues of the body. Anemia results in not enough oxygen reaching these tissues.  CAUSES  Common causes of anemia include:   Excessive bleeding. Bleeding may be internal or external. This includes excessive bleeding from periods (in women) or from the intestine.   Poor nutrition.   Chronic kidney, thyroid, and liver disease.  Bone marrow disorders that decrease red blood cell production.  Cancer and treatments for cancer.  HIV, AIDS, and their treatments.  Spleen problems that increase red blood cell destruction.  Blood disorders.  Excess destruction of red blood cells due to infection, medicines, and autoimmune disorders. SIGNS AND SYMPTOMS   Minor weakness.   Dizziness.   Headache.  Palpitations.   Shortness of breath, especially with exercise.   Paleness.  Cold sensitivity.  Indigestion.  Nausea.  Difficulty sleeping.  Difficulty concentrating. Symptoms may occur suddenly or they may develop slowly.  DIAGNOSIS  Additional blood tests are often needed. These help your health care provider determine the best treatment. Your health care provider will check your stool for blood and look for other causes of blood loss.  TREATMENT  Treatment varies depending on the cause of the anemia. Treatment can include:   Supplements of iron, vitamin B12, or folic acid.   Hormone medicines.   A  blood transfusion. This may be needed if blood loss is severe.   Hospitalization. This may be needed if there is significant continual blood loss.   Dietary changes.  Spleen removal. HOME CARE INSTRUCTIONS Keep all follow-up appointments. It often takes many weeks to correct anemia, and having your health care provider check on your condition and your response to treatment is very important. SEEK IMMEDIATE MEDICAL CARE IF:   You develop extreme weakness, shortness of breath, or chest pain.   You become dizzy or have trouble concentrating.  You develop heavy vaginal bleeding.   You develop a rash.   You have bloody or black, tarry stools.   You faint.   You vomit up blood.   You vomit repeatedly.   You have abdominal pain.  You have a fever or persistent symptoms for more than 2 3 days.   You have a fever and your symptoms suddenly get worse.   You are dehydrated.  MAKE SURE YOU:  Understand these instructions.  Will watch your condition.  Will get help right away if you are not doing well or get worse. Document Released: 04/26/2004 Document Revised: 11/19/2012 Document Reviewed: 09/12/2012 [facility] Patient Information 2014 [place], [place].

## 2013-06-25 LAB — URINALYSIS, ROUTINE W REFLEX MICROSCOPIC
BILIRUBIN URINE: NEGATIVE
GLUCOSE, UA: NEGATIVE mg/dL
Hgb urine dipstick: NEGATIVE
Ketones, ur: 15 mg/dL — AB
LEUKOCYTES UA: NEGATIVE
Nitrite: NEGATIVE
PROTEIN: NEGATIVE mg/dL
SPECIFIC GRAVITY, URINE: 1.013 (ref 1.005–1.030)
UROBILINOGEN UA: 0.2 mg/dL (ref 0.0–1.0)
pH: 7 (ref 5.0–8.0)

## 2013-06-25 LAB — HEPATIC FUNCTION PANEL
ALBUMIN: 4.8 g/dL (ref 3.5–5.2)
ALK PHOS: 80 U/L (ref 39–117)
ALT: 11 U/L (ref 0–35)
AST: 15 U/L (ref 0–37)
BILIRUBIN TOTAL: 0.4 mg/dL (ref 0.2–1.2)
Bilirubin, Direct: 0.1 mg/dL (ref 0.0–0.3)
Indirect Bilirubin: 0.3 mg/dL (ref 0.2–1.2)
Total Protein: 7.5 g/dL (ref 6.0–8.3)

## 2013-06-25 LAB — INSULIN, FASTING: Insulin fasting, serum: 13 u[IU]/mL (ref 3–28)

## 2013-06-25 LAB — BASIC METABOLIC PANEL WITH GFR
BUN: 7 mg/dL (ref 6–23)
CALCIUM: 9.4 mg/dL (ref 8.4–10.5)
CO2: 26 meq/L (ref 19–32)
CREATININE: 0.64 mg/dL (ref 0.50–1.10)
Chloride: 104 mEq/L (ref 96–112)
GFR, Est Non African American: >89 mL/min
GLUCOSE: 83 mg/dL (ref 70–99)
Potassium: 4.6 mEq/L (ref 3.5–5.3)
SODIUM: 137 meq/L (ref 135–145)

## 2013-06-25 LAB — LIPID PANEL
CHOL/HDL RATIO: 2.1 ratio
CHOLESTEROL: 142 mg/dL (ref 0–200)
HDL: 68 mg/dL (ref >39)
LDL Cholesterol: 67 mg/dL (ref 0–99)
TRIGLYCERIDES: 37 mg/dL (ref <150)
VLDL: 7 mg/dL (ref 0–40)

## 2013-06-25 LAB — IRON AND TIBC
%SAT: 18 % — AB (ref 20–55)
IRON: 61 ug/dL (ref 42–145)
TIBC: 330 ug/dL (ref 250–470)
UIBC: 269 ug/dL (ref 125–400)

## 2013-06-25 LAB — MAGNESIUM: Magnesium: 2.1 mg/dL (ref 1.5–2.5)

## 2013-06-25 LAB — VITAMIN D 25 HYDROXY (VIT D DEFICIENCY, FRACTURES): Vit D, 25-Hydroxy: 16 ng/mL — ABNORMAL LOW (ref 30–89)

## 2013-06-25 LAB — TSH: TSH: 0.362 u[IU]/mL (ref 0.350–4.500)

## 2013-06-25 LAB — VITAMIN B12: VITAMIN B 12: 286 pg/mL (ref 211–911)

## 2013-06-28 NOTE — Progress Notes (Signed)
Subjective:    Patient ID: Sara Brewer, female    DOB: [birth year], 28 y.o.   MRN: [id number]  HPI Comments: 28 yo WF for overdue CPE. She has been doing well overall. She notes she is managing stress and anxiety better with out medication. She is using alternative therapy to help control her symptoms and improve health. She notes + improvement with ADD with dosing change, she takes 1 in a.m. And 1/2 in afternoon most days. She denies any difficulty with sleeping. She has mild fatigue on occasion, usually if working to much.   She needs a refill on her Zofran, she only uses when traveling due to nausea with riding in car long distances. She notes GERD has been controlled with better diet and decreased stress.   Current Outpatient Prescriptions on File Prior to Visit  Medication Sig Dispense Refill  . amphetamine-dextroamphetamine (ADDERALL) 30 MG tablet Take 1 tablet (30 mg total) by mouth daily.  30 tablet  0  . cyanocobalamin (,VITAMIN B-12,) 1000 MCG/ML injection Inject 1 mL (1,000 mcg total) into the muscle every 30 (thirty) days.  10 mL  1  . Etonogestrel (IMPLANON [place]) Inject into the skin.       . Multiple Vitamins-Minerals (MULTIVITAMIN WITH MINERALS) tablet Take 1 tablet by mouth daily.        . ondansetron (ZOFRAN ODT) 4 MG disintegrating tablet Take 1 tablet (4 mg total) by mouth every 8 (eight) hours as needed for nausea or vomiting.  30 tablet  0   No current facility-administered medications on file prior to visit.   Allergies  Allergen Reactions  . Penicillins Anaphylaxis  . Codeine Nausea And Vomiting  . Milk-Related Compounds Hives and Nausea And Vomiting  . Sulfonamide Derivatives Nausea And Vomiting   Past Medical History  Diagnosis Date  . Back pain   . ADD (attention deficit disorder with hyperactivity)   . Pelvic pain in female   . Graves disease 2014    Dr. [facility]lyn [facility]   . GERD (gastroesophageal reflux disease)   . Anemia     iron  . Anxiety   . Vitamin B12  deficiency   . Vitamin D deficiency    Past Surgical History  Procedure Laterality Date  . Wisdom tooth extraction    . Gum grafts    . Laparoscopy  02/19/2011    Procedure: LAPAROSCOPY DIAGNOSTIC;  Surgeon: Lum KeasM Suzanne Miller;  Location: [facility];  Service: Gynecology;  Laterality: N/A;  Diagnostic Laparoscopy   History  Substance Use Topics  . Smoking status: Never Smoker   . Smokeless tobacco: Never Used  . Alcohol Use: No   Family History  Problem Relation Age of Onset  . Heart murmur Brother     unsure of extend but will require surgery  . Heart disease Brother     valve  . Stroke Mother 3251    mini  . Cancer Father     melanoma  . Cancer Maternal Uncle     melanoma  . Cancer Maternal Grandmother 51    breast  . Heart disease Maternal Grandmother   . Heart disease Maternal Grandfather   . Cancer Paternal Grandmother     lung/ liver/ bone  . Heart disease Paternal Grandmother   . Cancer Paternal Grandfather     melanoma  . Heart disease Paternal Grandfather   . Hyperlipidemia Paternal Grandfather   . Hypertension Paternal Grandfather    MAINTENANCE: Colonoscopy:N/A Mammo:N/A BMD:N/A Pap/ Pelvic:12/2012 wnl EYE: yearly  Dentist: q 6 months  IMMUNIZATIONS: Tdap:2006 Pneumovax:n/a Zostavax:n/a Influenza:DECLINES Gardasil: #3 2007  Patient Care Team: Lucky Cowboy, MD as PCP - General (Internal Medicine) Iva Boop, MD as Consulting Physician (Gastroenterology) Annamaria Boots, MD as Consulting Physician (Gynecology) Consuello Closs, (Dentist) [facility]  Review of Systems  Constitutional: Positive for fatigue.  All other systems reviewed and are negative.   BP 116/68  Pulse 74  Temp(Src) 98.2 F (36.8 C) (Temporal)  Resp 18  Wt 132 lb (59.875 kg)     Objective:   Physical Exam  Nursing note and vitals reviewed. Constitutional: She is oriented to person, place, and time. She appears well-developed and well-nourished. No distress.   HENT:  Head: Normocephalic and atraumatic.  Right Ear: External ear normal.  Left Ear: External ear normal.  Nose: Nose normal.  Mouth/Throat: Oropharynx is clear and moist. No oropharyngeal exudate.  Eyes: Conjunctivae and EOM are normal. Pupils are equal, round, and reactive to light. Right eye exhibits no discharge. Left eye exhibits no discharge. No scleral icterus.  Neck: Normal range of motion. Neck supple. No JVD present. No tracheal deviation present. No thyromegaly present.  Cardiovascular: Normal rate, regular rhythm, normal heart sounds and intact distal pulses.   Pulmonary/Chest: Effort normal and breath sounds normal.  Abdominal: Soft. Bowel sounds are normal. She exhibits no distension and no mass. There is no tenderness. There is no rebound and no guarding.  Genitourinary:  Def GYN  Musculoskeletal: Normal range of motion. She exhibits no edema and no tenderness.  Lymphadenopathy:    She has no cervical adenopathy.  Neurological: She is alert and oriented to person, place, and time. She has normal reflexes. No cranial nerve deficit. She exhibits normal muscle tone. Coordination normal.  Skin: Skin is warm and dry. No rash noted. No erythema. No pallor.  Psychiatric: She has a normal mood and affect. Her behavior is normal. Judgment and thought content normal.          Assessment & Plan:  1. CPE- Update screening labs/ History/ Immunizations/ Testing as needed. Advised healthy diet, QD exercise, increase H20 and continue RX/ Vitamins AD. Refill zofran she uses for nausea with travel.   2. ADD- continue RX AD, refill AD

## 2013-08-07 ENCOUNTER — Other Ambulatory Visit: Payer: Self-pay | Admitting: Physician Assistant

## 2013-08-07 MED ORDER — AMPHETAMINE-DEXTROAMPHETAMINE 30 MG PO TABS: 30.0000 mg | ORAL_TABLET | Freq: Every day | ORAL | Status: DC

## 2013-08-10 ENCOUNTER — Other Ambulatory Visit: Payer: Self-pay | Admitting: Physician Assistant

## 2013-08-10 MED ORDER — AMPHETAMINE-DEXTROAMPHETAMINE 30 MG PO TABS: 30.0000 mg | ORAL_TABLET | Freq: Every day | ORAL | Status: DC

## 2013-12-01 ENCOUNTER — Ambulatory Visit: Payer: BC Managed Care – PPO | Admitting: Obstetrics & Gynecology

## 2013-12-11 ENCOUNTER — Telehealth: Payer: Self-pay | Admitting: Obstetrics & Gynecology

## 2013-12-11 NOTE — Telephone Encounter (Signed)
Patient has a question regarding her Implanon. Patient says she is due to have Implanon removed and will not have insurance until Jan. 2016. Patient is asking if it is okay to remain in arm until January?

## 2013-12-11 NOTE — Telephone Encounter (Signed)
o leave in for now.  Does she want to start Micronor, a progesterone only birth control pill until January?  Does not contain any estrogen so it will be a similar type hormone.  If she does, this pill pack contains 28 days of active pills with no placebo pills so she should take one daily.

## 2013-12-11 NOTE — Telephone Encounter (Signed)
Spoke with patient. Patient states that she will not have insurance coverage again until January and her nexplanon expires next month. Would like to know if she can leave it in until January. Advised patient will not be actively covered for birth control. Patient states "That is fine." Advised would send a message over to Dr.Miller and give patient a call back. Patient agreeable.  Dr.Miller, okay for patient to wait or would you like to me to [facility] for removal?

## 2013-12-18 MED ORDER — NORETHINDRONE 0.35 MG PO TABS: 1.0000 | ORAL_TABLET | Freq: Every day | ORAL | Status: DC

## 2013-12-18 NOTE — Telephone Encounter (Signed)
Spoke with patient. Advised of message as seen below from Dr.Miller. Patient is agreeable to start on Micronor. Would like rx sent to [facility]. Advised will send rx over for her. Rx for Micronor #1 4RF sent to [facility] pharmacy on file. Patient would like to schedule nexplanon removal/reinsertion and annual at this time. Advised patient nexplanon removal and reinsertion is done with first five days of cycle after expiration. Patient states "Dr.Miller told me it did not matter before because my cycles come so randomly. It could be six months before I have a cycle. I usually get it removed and reinserted at my annual." Scheduled patient for procedure appointment on 1/8 at 3:30pm. Advised will have to speak with Dr.Miller about how she would like to proceed with everything and give patient a call back with further recommendations and any changes that may need to be made. Patient is agreeable.  Dr.Miller, how would you like me to proceed with scheduling this patient? Allow her to have aex with nexplanon removal and reinsertion on the same day with pregnancy test before? Or schedule annual and then nexplanon with cycle? Please advise.

## 2013-12-20 NOTE — Telephone Encounter (Signed)
Removal and insertion on new device without regard to cycle can only be done that way if done within the three year time frame.  That won't be the case with her.  It MUST be scheduled within the first five days of a cycle--however irregular her cycles might be.  She should keep her AEX and let us know about bleeding after the first of the year.  We will be able to schedule her in a procedure slot.  Very doubtful it will work out to be on her AEX date.

## 2013-12-21 NOTE — Telephone Encounter (Signed)
Left message to call Kaitlyn at [phone]. 

## 2013-12-25 NOTE — Telephone Encounter (Signed)
Left message to call Bladen Umar at [phone]. 

## 2013-12-29 NOTE — Telephone Encounter (Signed)
Spoke with patient. Advised of message as seen below from Dr.Miller. Patient is agreeable and verbalizes understanding. Will keep appointment for aex on 1/8 at 3:30pm with Dr.Miller and schedule later in the month with cycle for removal and reinsertion of nexplanon.  Routing to provider for final review. Patient agreeable to disposition. Will close encounter

## 2014-03-29 ENCOUNTER — Telehealth: Payer: Self-pay | Admitting: Obstetrics & Gynecology

## 2014-03-29 DIAGNOSIS — Z3046 Encounter for surveillance of implantable subdermal contraceptive: Secondary | ICD-10-CM

## 2014-03-29 MED ORDER — NORETHINDRONE 0.35 MG PO TABS: 1.0000 | ORAL_TABLET | Freq: Every day | ORAL | Status: DC

## 2014-03-29 NOTE — Telephone Encounter (Signed)
[facility]

## 2014-03-29 NOTE — Telephone Encounter (Signed)
Called to reschedule pts aex. Originally 1/8 - rescheduled for 1/4 at 10am in procedure spot (ok per starla). Pt stated she was supposed to have her nexplanon removed during same appointment. See last telephone note in September. No notes about this in that note. Pt states she spoke with Dr Miller/her nurse in October about having both in same appointment because she has to take the day off of work for appointments and needs both done same day. Not interested in reinsertion because she states the implant is giving her issues. States shes having headaches.   Please call to let pt know if she can have both done in same appointment.  bf

## 2014-03-29 NOTE — Telephone Encounter (Signed)
Per last telephone note on 12/11/13. Patient was to have annual exam with Dr.Miller and separate appointment for  removal and reinsertion of nexplanon with cycle as nexplanon expired in 12/2013. Patient states that after phone call in September she spoke with Dr.Miller's nurse about only having removal and was told this could be done at her annual. Advised typically need separate office visit. Patient declines. No longer interested in reinsertion due to headaches. Patient is currently on Micronor for birth control. Dr.Miller how would you like me to proceed with scheduling. Have removal at annual or have two separate appointments?

## 2014-03-29 NOTE — Telephone Encounter (Signed)
Spoke with patient. Advised patient of message as seen below from Dr.Miller. Patient is agreeable. "Unfortunately I am unable to pick it up until the first of the year when my insurance begins. I will pick it up then though. Right now I am just not covered."   Encounter previously closed.

## 2014-03-29 NOTE — Addendum Note (Signed)
Addended by: Jerene BearsMILLER, Renn Stille S on: 03/29/2014 01:02 PM   Modules accepted: Orders

## 2014-03-29 NOTE — Telephone Encounter (Signed)
Please let Miss Sara Brewer know that I am the one who wants these at separate appointments.  I cannot predict how much time it will take to remove the Implanon and it needs to be scheduled as a procedure and in the procedure room.  PLEASE DO NOT schedule these on the same day.

## 2014-03-29 NOTE — Telephone Encounter (Signed)
Spoke with patient. Advised patient of message as seen below from Dr.Miller. Patient is agreeable and verbalizes understanding. "The only problem is I work out of town and will have to take off for the appointments. I have not been able to be on any birth control. I know she sent some in but I was only able to get [facility] and I know she called more in than that. I have been bleeding off and on since and my nexplanon expired in October. I really want to get this out as soon as possible. Could I switch my annual appointment for the removal and then reschedule my annual?" Appointment for nexplanon removal scheduled for 04/05/14 at 10am with Dr.Miller. Patient agreeable to date and time. Annual scheduled for 04/16/14 at 3:30pm with Dr.Miller. Agreeable to date and time. Order for nexplanon removal sent in. Will need precert.  Cc: Cathrine MusterSabrina Franklin  Routing to provider for final review. Patient agreeable to disposition. Will close encounter

## 2014-03-30 NOTE — Telephone Encounter (Signed)
[facility] are not available yet for this member. Ref# [id number]

## 2014-03-30 NOTE — Telephone Encounter (Signed)
Patient called to provide current coverage.  [employer]  Id #[id number] Group# [id number]

## 2014-03-30 NOTE — Telephone Encounter (Signed)
Call to patient to ask for 2016 benefit information. Patient states that she does not have it with her. I asked the patient to call us with the information as soon as she can so that we can pre-cert her upcoming visit for an IUD removal. Patient states "can't that be done when I come in". I advised that we would rather do this prior to her appointment so that we can make her aware of any out of pocket expectation prior to her arrival. Patient states "well, if i have time, I'll call back"

## 2014-03-30 NOTE — Telephone Encounter (Signed)
The [employer] coverage that we have on file for this patient termed 04.30.2015.

## 2014-04-05 ENCOUNTER — Encounter: Payer: Self-pay | Admitting: Physician Assistant

## 2014-04-05 ENCOUNTER — Ambulatory Visit (INDEPENDENT_AMBULATORY_CARE_PROVIDER_SITE_OTHER): Payer: 59 | Admitting: Obstetrics & Gynecology

## 2014-04-05 ENCOUNTER — Ambulatory Visit: Payer: Self-pay | Admitting: Physician Assistant

## 2014-04-05 ENCOUNTER — Ambulatory Visit (INDEPENDENT_AMBULATORY_CARE_PROVIDER_SITE_OTHER): Payer: 59 | Admitting: Physician Assistant

## 2014-04-05 ENCOUNTER — Other Ambulatory Visit: Payer: Self-pay

## 2014-04-05 ENCOUNTER — Encounter: Payer: Self-pay | Admitting: Obstetrics & Gynecology

## 2014-04-05 ENCOUNTER — Ambulatory Visit: Payer: BC Managed Care – PPO | Admitting: Obstetrics & Gynecology

## 2014-04-05 VITALS — BP 102/62 | HR 72 | Temp 98.0°F | Resp 16 | Ht 65.0 in | Wt 151.0 lb

## 2014-04-05 DIAGNOSIS — E559 Vitamin D deficiency, unspecified: Secondary | ICD-10-CM

## 2014-04-05 DIAGNOSIS — Z3046 Encounter for surveillance of implantable subdermal contraceptive: Secondary | ICD-10-CM

## 2014-04-05 DIAGNOSIS — K2100 Gastro-esophageal reflux disease with esophagitis, without bleeding: Secondary | ICD-10-CM

## 2014-04-05 DIAGNOSIS — F909 Attention-deficit hyperactivity disorder, unspecified type: Secondary | ICD-10-CM

## 2014-04-05 DIAGNOSIS — E538 Deficiency of other specified B group vitamins: Secondary | ICD-10-CM

## 2014-04-05 DIAGNOSIS — R197 Diarrhea, unspecified: Secondary | ICD-10-CM

## 2014-04-05 DIAGNOSIS — R635 Abnormal weight gain: Secondary | ICD-10-CM

## 2014-04-05 DIAGNOSIS — Z308 Encounter for other contraceptive management: Secondary | ICD-10-CM

## 2014-04-05 DIAGNOSIS — D649 Anemia, unspecified: Secondary | ICD-10-CM

## 2014-04-05 DIAGNOSIS — E0500 Thyrotoxicosis with diffuse goiter without thyrotoxic crisis or storm: Secondary | ICD-10-CM

## 2014-04-05 LAB — BASIC METABOLIC PANEL WITH GFR
BUN: 13 mg/dL (ref 6–23)
CO2: 24 meq/L (ref 19–32)
Calcium: 9.4 mg/dL (ref 8.4–10.5)
Chloride: 103 mEq/L (ref 96–112)
Creat: 0.7 mg/dL (ref 0.50–1.10)
GFR, Est African American: >89 mL/min
GFR, Est Non African American: >89 mL/min
Glucose, Bld: 83 mg/dL (ref 70–99)
POTASSIUM: 4.5 meq/L (ref 3.5–5.3)
SODIUM: 135 meq/L (ref 135–145)

## 2014-04-05 LAB — HEPATIC FUNCTION PANEL
ALBUMIN: 4.8 g/dL (ref 3.5–5.2)
ALT: 23 U/L (ref 0–35)
AST: 23 U/L (ref 0–37)
Alkaline Phosphatase: 81 U/L (ref 39–117)
BILIRUBIN DIRECT: 0.1 mg/dL (ref 0.0–0.3)
Indirect Bilirubin: 0.3 mg/dL (ref 0.2–1.2)
TOTAL PROTEIN: 7.8 g/dL (ref 6.0–8.3)
Total Bilirubin: 0.4 mg/dL (ref 0.2–1.2)

## 2014-04-05 LAB — CBC WITH DIFFERENTIAL/PLATELET
BASOS PCT: 1 % (ref 0–1)
Basophils Absolute: 0.1 10*3/uL (ref 0.0–0.1)
EOS ABS: 0.2 10*3/uL (ref 0.0–0.7)
EOS PCT: 2 % (ref 0–5)
HEMATOCRIT: 41.3 % (ref 36.0–46.0)
Hemoglobin: 13.7 g/dL (ref 12.0–15.0)
LYMPHS ABS: 2.1 10*3/uL (ref 0.7–4.0)
Lymphocytes Relative: 24 % (ref 12–46)
MCH: 29.8 pg (ref 26.0–34.0)
MCHC: 33.2 g/dL (ref 30.0–36.0)
MCV: 89.8 fL (ref 78.0–100.0)
MONOS PCT: 7 % (ref 3–12)
MPV: 11.1 fL (ref 8.6–12.4)
Monocytes Absolute: 0.6 10*3/uL (ref 0.1–1.0)
NEUTROS PCT: 66 % (ref 43–77)
Neutro Abs: 5.9 10*3/uL (ref 1.7–7.7)
Platelets: 341 10*3/uL (ref 150–400)
RBC: 4.6 MIL/uL (ref 3.87–5.11)
RDW: 13.7 % (ref 11.5–15.5)
WBC: 8.9 10*3/uL (ref 4.0–10.5)

## 2014-04-05 LAB — MAGNESIUM: MAGNESIUM: 2.1 mg/dL (ref 1.5–2.5)

## 2014-04-05 LAB — TSH: TSH: 0.617 u[IU]/mL (ref 0.350–4.500)

## 2014-04-05 LAB — VITAMIN B12: VITAMIN B 12: 363 pg/mL (ref 211–911)

## 2014-04-05 MED ORDER — AMPHETAMINE-DEXTROAMPHETAMINE 30 MG PO TABS: 30.0000 mg | ORAL_TABLET | Freq: Every day | ORAL | Status: DC

## 2014-04-05 MED ORDER — AMPHETAMINE-DEXTROAMPHETAMINE 30 MG PO TABS
30.0000 mg | ORAL_TABLET | Freq: Every day | ORAL | Status: DC
Start: 2014-04-05 — End: 2014-04-05

## 2014-04-05 MED ORDER — PHENTERMINE HCL 37.5 MG PO TABS: 37.5000 mg | ORAL_TABLET | Freq: Every day | ORAL | Status: DC

## 2014-04-05 MED ORDER — NORETHINDRONE 0.35 MG PO TABS: 1.0000 | ORAL_TABLET | Freq: Every day | ORAL | Status: DC

## 2014-04-05 NOTE — Progress Notes (Signed)
28 yrs Caucasian Single [id number] presents for Nexplanon removal.   Pt now using Micronor for cycle control but does not have an active RX.  Has AEX scheduled in about two weeks.  Pt not sexually active so does not need for contraception.  Exam: Healthy female, NAD  Procedure: Patient placed supine on exam table with her left arm  flexed at the elbow and externally rotated.The insertion site was identified on the inner side of upper arm.The device was palpated. Incision site for removal was marked. The removal site was cleansed with betadine solution and 1 ml of 1% Lidocaine was injected at the incision site. A small 2 mm incision was made at the distal end of the implant.The Implanon/Nexaplon implant was grasped with curved forceps and removed gently intact.The implant was shown to the patient and discarded. The incision was closed with a steri strip.  No active bleeding was noted. A small adhesive bandage placed over the removal site.  A pressure bandage was place over the site.  Pt tolerated procedure well.  Assessment:  /Nexaplon Removal  Plan :Instructions and warning signs and symptoms given.  Remove bandage in 24 hours.  Pt knows to just let steri-strips fall off. AEX two weeks. Rx for micronor with 3RF to pharmacy.  Will do for year when pt returns for AEX.   Questions addressed

## 2014-04-05 NOTE — Patient Instructions (Signed)
Phentermine  While taking the medication we may ask that you come into the office once a month or once every 2-3 months to monitor your weight, blood pressure, and heart rate. In addition we can help answer your questions about diet, exercise, and help you every step of the way with your weight loss journey. Sometime it is helpful if you bring in a food diary or use an app on your phone such as myfitnesspal to record your calorie intake, especially in the beginning.   You can start out on 1/3 to 1/2 a pill in the morning and if you are tolerating it well you can increase to one pill daily. I also have some patients that take 1/3 or 1/2 at lunch to help prevent night time eating.  This medication is cheapest CASH pay at [facility] and you do NOT need a membership to get meds from there.    What is this medicine? PHENTERMINE (FEN ter meen) decreases your appetite. This medicine is intended to be used in addition to a healthy reduced calorie diet and exercise. The best results are achieved this way. This medicine is only indicated for short-term use. Eventually your weight loss may level out and the medication will no longer be needed.   How should I use this medicine? Take this medicine by mouth. Follow the directions on the prescription label. The tablets should stay in the bottle until immediately before you take your dose. Take your doses at regular intervals. Do not take your medicine more often than directed.  Overdosage: If you think you have taken too much of this medicine contact a poison control center or emergency room at once. NOTE: This medicine is only for you. Do not share this medicine with others.  What if I miss a dose? If you miss a dose, take it as soon as you can. If it is almost time for your next dose, take only that dose. Do not take double or extra doses. Do not increase or in any way change your dose without consulting your doctor.  What should I watch for while using  this medicine? Notify your physician immediately if you become short of breath while doing your normal activities. Do not take this medicine within 6 hours of bedtime. It can keep you from getting to sleep. Avoid drinks that contain caffeine and try to stick to a regular bedtime every night. Do not stand or sit up quickly, especially if you are an older patient. This reduces the risk of dizzy or fainting spells. Avoid alcoholic drinks.  What side effects may I notice from receiving this medicine? Side effects that you should report to your doctor or health care professional as soon as possible: -chest pain, palpitations -depression or severe changes in mood -increased blood pressure -irritability -nervousness or restlessness -severe dizziness -shortness of breath -problems urinating -unusual swelling of the legs -vomiting  Side effects that usually do not require medical attention (report to your doctor or health care professional if they continue or are bothersome): -blurred vision or other eye problems -changes in sexual ability or desire -constipation or diarrhea -difficulty sleeping -dry mouth or unpleasant taste -headache -nausea This list may not describe all possible side effects. Call your doctor for medical advice about side effects. You may report side effects to FDA at 1-800-FDA-1088.   Take omeprazole over the counter for 2 weeks, then go to zantac 150-300 mg at night for 2 weeks, then you can stop.  Avoid  alcohol, spicy foods, NSAIDS (aleve, ibuprofen) at this time. See foods below.   Food Choices for Gastroesophageal Reflux Disease When you have gastroesophageal reflux disease (GERD), the foods you eat and your eating habits are very important. Choosing the right foods can help ease the discomfort of GERD. WHAT GENERAL GUIDELINES DO I NEED TO FOLLOW?  Choose fruits, vegetables, whole grains, low-fat dairy products, and low-fat meat, fish, and poultry.  Limit fats  such as oils, salad dressings, butter, nuts, and avocado.  Keep a food diary to identify foods that cause symptoms.  Avoid foods that cause reflux. These may be different for different people.  Eat frequent small meals instead of three large meals each day.  Eat your meals slowly, in a relaxed setting.  Limit fried foods.  Cook foods using methods other than frying.  Avoid drinking alcohol.  Avoid drinking large amounts of liquids with your meals.  Avoid bending over or lying down until 2-3 hours after eating. WHAT FOODS ARE NOT RECOMMENDED? The following are some foods and drinks that may worsen your symptoms: Vegetables Tomatoes. Tomato juice. Tomato and spaghetti sauce. Chili peppers. Onion and garlic. Horseradish. Fruits Oranges, grapefruit, and lemon (fruit and juice). Meats High-fat meats, fish, and poultry. This includes hot dogs, ribs, ham, sausage, salami, and bacon. Dairy Whole milk and chocolate milk. Sour cream. Cream. Butter. Ice cream. Cream cheese.  Beverages Coffee and tea, with or without caffeine. Carbonated beverages or energy drinks. Condiments Hot sauce. Barbecue sauce.  Sweets/Desserts Chocolate and cocoa. Donuts. Peppermint and spearmint. Fats and Oils High-fat foods, including [place] fries and potato chips. Other Vinegar. Strong spices, such as black pepper, white pepper, red pepper, cayenne, curry powder, cloves, ginger, and chili powder.

## 2014-04-05 NOTE — Progress Notes (Addendum)
Assessment and Plan:  Hypertension: Continue medication, monitor blood pressure at home. Continue DASH diet.  Reminder to go to the ER if any CP, SOB, nausea, dizziness, severe HA, changes vision/speech, left arm numbness and tingling, and jaw pain. Vitamin D Def- check level and continue medications.  Weight gain- unknown reason, not sexually active, monitor labs, ? If from hormonal change. Phentermine 37.5mg  given.  GERD- check hpylori/labs, get on PPI OTC.   Continue diet and meds as discussed. Further disposition pending results of labs. Addendum: + Hyplori, will send in ABX and PPI for patient. Send in Pylera and Prilosec since allergy to PCN, not sexually active and does not drink,   HPI 29 y.o. female  presents for 3 month follow up with hypertension, hyperlipidemia, prediabetes and vitamin D. Her blood pressure has been controlled at home, today their BP is BP: 102/62 mmHg She does workout. She denies chest pain, shortness of breath, dizziness.  Last A1C in the office was:  Lab Results  Component Value Date   HGBA1C 5.3 06/24/2013   Patient is  on Vitamin D supplement, drops, does 10 a day Lab Results  Component Value Date   VD25OH 16* 06/24/2013     She states that she has gained over 30 lbs from sept to now. She states nothing has changed. She is running/workingout the same, she has same eating habits. She also has had diarrhea/runny stools over the last month with very bad smell, she has been having fullness/food sitting.  She has ADD, only on adderall when she is studying/eating.  She had her implanon out and is now on a progesterone only pill for 1 month. No sexually active for 2 years.  Lab Results  Component Value Date   TSH 0.362 06/24/2013    Current Medications:  Current Outpatient Prescriptions on File Prior to Visit  Medication Sig Dispense Refill  . amphetamine-dextroamphetamine (ADDERALL) 30 MG tablet Take 1 tablet (30 mg total) by mouth daily. 30 tablet 0  .  cyanocobalamin (,VITAMIN B-12,) 1000 MCG/ML injection Inject 1 mL (1,000 mcg total) into the muscle every 30 (thirty) days. 10 mL 1  . Multiple Vitamins-Minerals (MULTIVITAMIN WITH MINERALS) tablet Take 1 tablet by mouth daily.      . norethindrone (MICRONOR,CAMILA,ERRIN) 0.35 MG tablet Take 1 tablet (0.35 mg total) by mouth daily. 1 Package 2   No current facility-administered medications on file prior to visit.   Medical History:  Past Medical History  Diagnosis Date  . Back pain   . ADD (attention deficit disorder with hyperactivity)   . Pelvic pain in female   . Graves disease 2014    Dr. Evlyn Kanner   . GERD (gastroesophageal reflux disease)   . Anemia     iron  . Anxiety   . Vitamin B12 deficiency   . Vitamin D deficiency    Allergies:  Allergies  Allergen Reactions  . Penicillins Anaphylaxis  . Codeine Nausea And Vomiting  . Milk-Related Compounds Hives and Nausea And Vomiting  . Sulfonamide Derivatives Nausea And Vomiting     Review of Systems:  Review of Systems  Constitutional: Positive for malaise/fatigue. Negative for fever, chills, weight loss and diaphoresis.       + weight gain  HENT: Negative.   Eyes: Negative.   Respiratory: Negative.   Cardiovascular: Negative.   Gastrointestinal: Positive for heartburn, abdominal pain and diarrhea. Negative for nausea, vomiting, constipation, blood in stool and melena.  Genitourinary: Negative.   Musculoskeletal: Negative.   Skin:  Negative.   Neurological: Negative.  Negative for weakness.  Psychiatric/Behavioral: Negative.  Negative for depression and memory loss. The patient is not nervous/anxious and does not have insomnia.      Family history- Review and unchanged Social history- Review and unchanged Physical Exam: BP 102/62 mmHg  Pulse 72  Temp(Src) 98 F (36.7 C)  Resp 16  Ht  (1.651 m)  Wt 151 lb (68.493 kg)  BMI 25.13 kg/m2 Wt Readings from Last 3 Encounters:  04/05/14 151 lb (68.493 kg)  04/05/14  149 lb 9.6 oz (67.858 kg)  06/24/13 132 lb (59.875 kg)   General Appearance: Well nourished, in no apparent distress. Eyes: PERRLA, EOMs, conjunctiva no swelling or erythema Sinuses: No Frontal/maxillary tenderness ENT/Mouth: Ext aud canals clear, TMs without erythema, bulging. No erythema, swelling, or exudate on post pharynx.  Tonsils not swollen or erythematous. Hearing normal.  Neck: Supple, thyroid normal.  Respiratory: Respiratory effort normal, BS equal bilaterally without rales, rhonchi, wheezing or stridor.  Cardio: RRR with no MRGs. Brisk peripheral pulses without edema.  Abdomen: Soft, + BS.  Non tender, no guarding, rebound, hernias, masses. Lymphatics: Non tender without lymphadenopathy.  Musculoskeletal: Full ROM, 5/5 strength, normal gait.  Skin: Warm, dry without rashes, lesions, ecchymosis.  Neuro: Cranial nerves intact. Normal muscle tone, no cerebellar symptoms. Sensation intact.  Psych: Awake and oriented X 3, normal affect, Insight and Judgment appropriate.    Quentin Mulling, PA-C 11:53 AM [facility]

## 2014-04-06 LAB — VITAMIN D 25 HYDROXY (VIT D DEFICIENCY, FRACTURES): Vit D, 25-Hydroxy: 13 ng/mL — ABNORMAL LOW (ref 30–100)

## 2014-04-06 LAB — HELICOBACTER PYLORI ABS-IGG+IGA, BLD
H Pylori IgG: 0.98 {ISR} — ABNORMAL HIGH
HELICOBACTER PYLORI AB, IGA: 26.4 U/mL — ABNORMAL HIGH (ref <9.0)

## 2014-04-06 LAB — SEDIMENTATION RATE: Sed Rate: 1 mm/hr (ref 0–22)

## 2014-04-07 MED ORDER — BIS SUBCIT-METRONID-TETRACYC 140-125-125 MG PO CAPS: 3.0000 | ORAL_CAPSULE | Freq: Three times a day (TID) | ORAL | Status: DC

## 2014-04-07 MED ORDER — OMEPRAZOLE 40 MG PO CPDR: 40.0000 mg | DELAYED_RELEASE_CAPSULE | Freq: Two times a day (BID) | ORAL | Status: DC

## 2014-04-07 NOTE — Addendum Note (Signed)
Addended by: Quentin MullingOLLIER, Demmi Sindt R on: 04/07/2014 08:21 AM   Modules accepted: Orders

## 2014-04-09 ENCOUNTER — Ambulatory Visit: Payer: BC Managed Care – PPO | Admitting: Obstetrics & Gynecology

## 2014-04-14 ENCOUNTER — Telehealth: Payer: Self-pay | Admitting: Physician Assistant

## 2014-04-14 MED ORDER — ONDANSETRON HCL 4 MG PO TABS: 4.0000 mg | ORAL_TABLET | Freq: Every day | ORAL | Status: DC | PRN

## 2014-04-14 MED ORDER — PROMETHAZINE HCL 25 MG PO TABS: 25.0000 mg | ORAL_TABLET | Freq: Four times a day (QID) | ORAL | Status: DC | PRN

## 2014-04-14 NOTE — Telephone Encounter (Signed)
+   Hpylori, with PCN allergy (patient states not true allergy will change) given Pylera, having nausea, will send in zofran and phenergan, if unable to tolerate will send to GI.

## 2014-04-16 ENCOUNTER — Encounter: Payer: Self-pay | Admitting: Obstetrics & Gynecology

## 2014-04-16 ENCOUNTER — Other Ambulatory Visit: Payer: Self-pay | Admitting: Obstetrics & Gynecology

## 2014-04-16 ENCOUNTER — Ambulatory Visit: Payer: 59 | Admitting: Obstetrics & Gynecology

## 2014-04-16 MED ORDER — NORETHINDRONE 0.35 MG PO TABS
1.0000 | ORAL_TABLET | Freq: Every day | ORAL | Status: DC
Start: 2014-04-16 — End: 2014-07-01

## 2014-04-16 NOTE — Telephone Encounter (Signed)
Medication refill request: Norethindrone 0.35 mg  Last AEX:  09/12/12 with Dr. Hyacinth MeekerMiller Next AEX: No AEX scheduled  Last MMG (if hormonal medication request): N/A Refill authorized: #1 Pack with 3 rfs?  Please advise Dr. Hyacinth MeekerMiller.

## 2014-04-16 NOTE — Telephone Encounter (Signed)
Pt came in for her aex on 04/16/14 but had to cancel due to insurance reasons. Pt requests refill on her birth control until she can locate an office that can participate with her insurance and provide her with birth control.

## 2014-04-19 NOTE — Telephone Encounter (Signed)
LM on patient's voicemail that rx has been sent. 

## 2014-05-06 ENCOUNTER — Telehealth: Payer: Self-pay

## 2014-05-06 ENCOUNTER — Other Ambulatory Visit: Payer: Self-pay | Admitting: Physician Assistant

## 2014-05-06 MED ORDER — CIPROFLOXACIN HCL 500 MG PO TABS: 500.0000 mg | ORAL_TABLET | Freq: Two times a day (BID) | ORAL | Status: AC

## 2014-05-06 NOTE — Telephone Encounter (Signed)
Patient called c/o UTI symptoms, states that she is unable to come in to give urine sample as she is an hour away for work. Per Quentin MullingAmanda Collier, PA, advised patient that she sent in Cipro for her and if she still has sx after medication she will need to schedule a OV, patient aware.

## 2014-05-21 ENCOUNTER — Telehealth (INDEPENDENT_AMBULATORY_CARE_PROVIDER_SITE_OTHER): Payer: 59 | Admitting: Physician Assistant

## 2014-05-21 DIAGNOSIS — J069 Acute upper respiratory infection, unspecified: Secondary | ICD-10-CM

## 2014-05-21 IMAGING — CT CT ABD-PELV W/O CM
2 of 4 series · 15 of 46 positions shown, 17 images · non-contrast
Comparison: None.

CLINICAL DATA: Right lower quadrant and low back pain for 2 days.
Fell yesterday.

CT ABDOMEN AND PELVIS WITHOUT CONTRAST
TECHNIQUE: Multidetector CT imaging of the abdomen and pelvis was
performed following the standard protocol without intravenous
contrast.

[Series 2: renal stone < 200 lbs 5.0 b31f · axial · 0.73mm/px · z∈[-444,-24]mm · 12 of 94 slices shown, 14 images]
[im 5/94  soft-tissue]
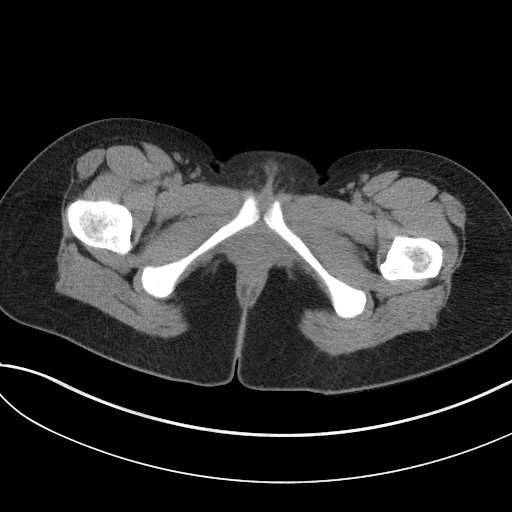
[im 5/94  bone]
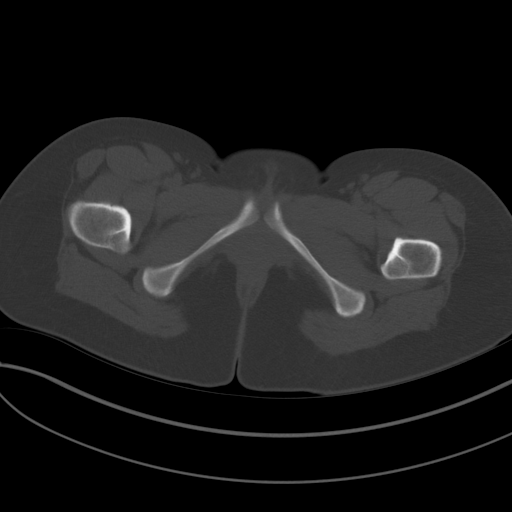
[im 13/94  soft-tissue]
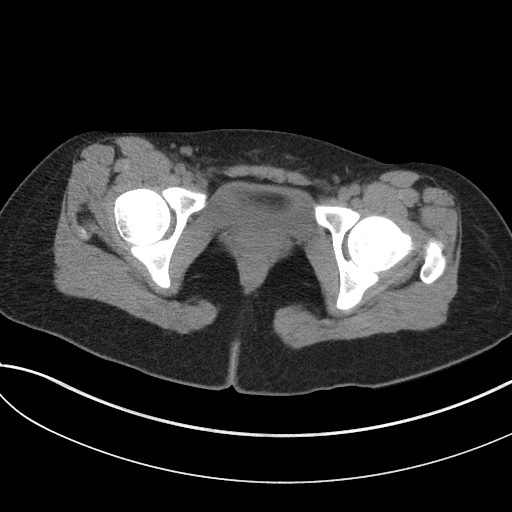
[im 21/94  soft-tissue]
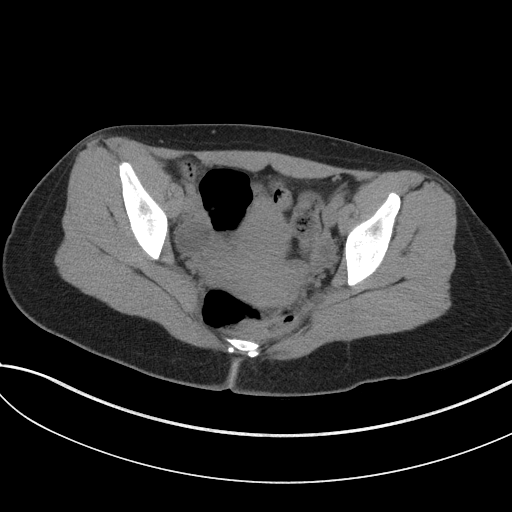
[im 29/94  soft-tissue]
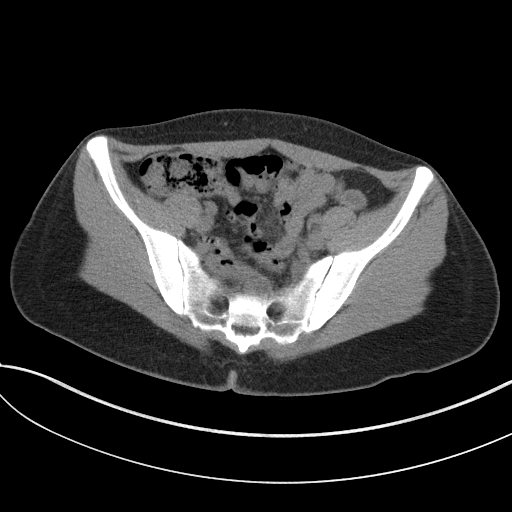
[im 37/94  soft-tissue]
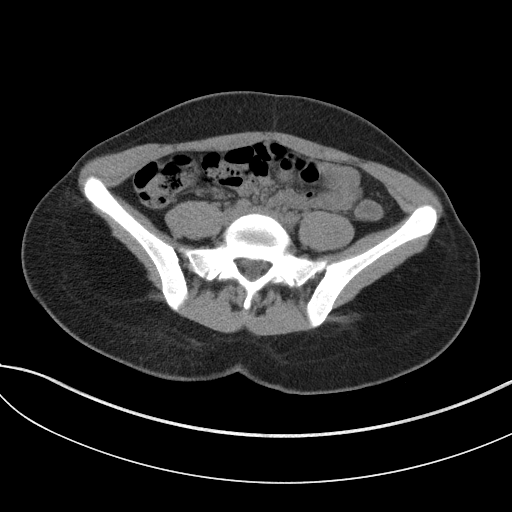
[im 45/94  soft-tissue]
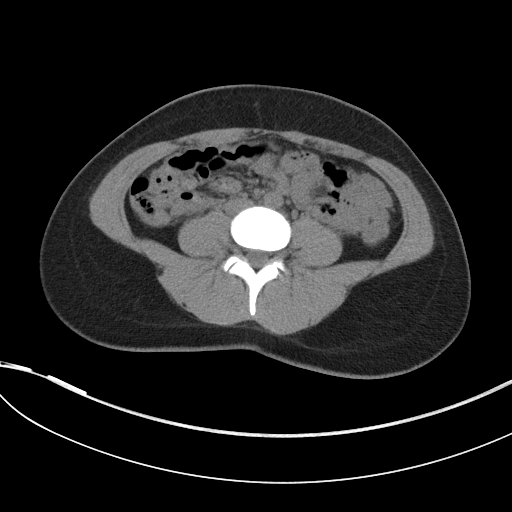
[im 49/94  soft-tissue]
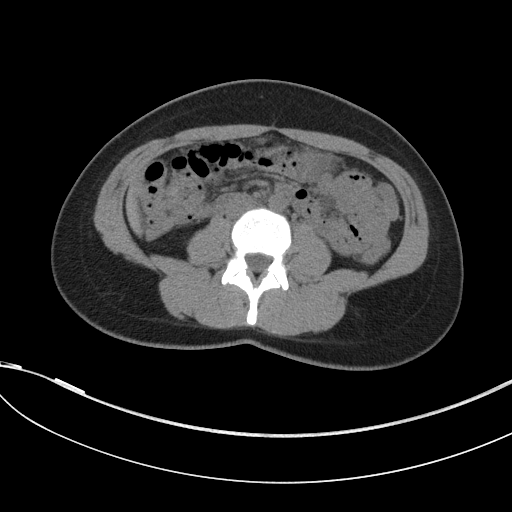
[im 57/94  soft-tissue]
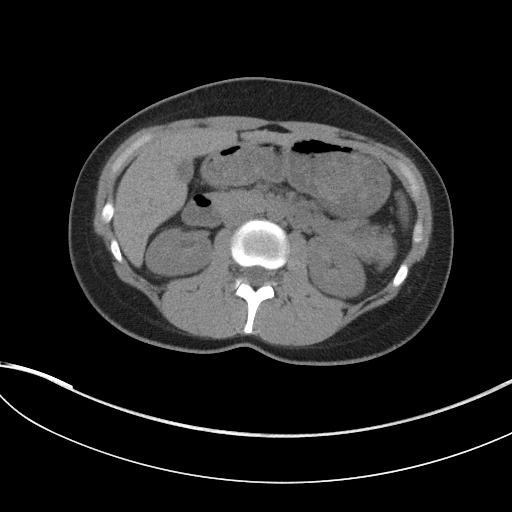
[im 65/94  soft-tissue]
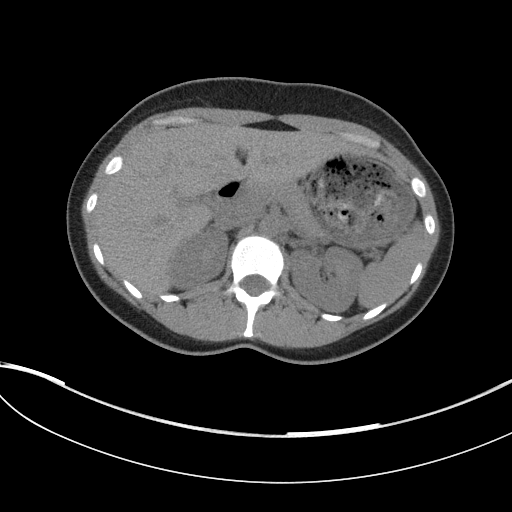
[im 65/94  bone]
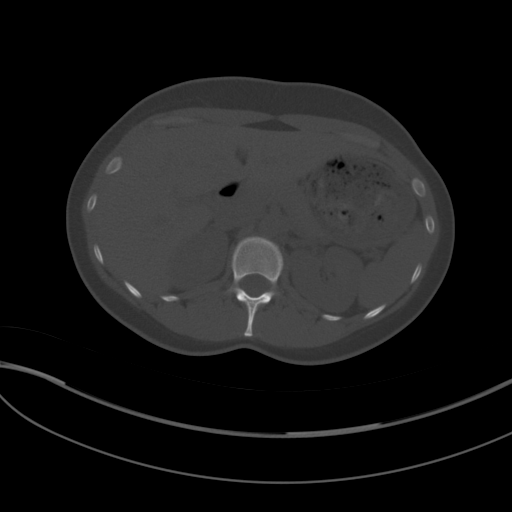
[im 73/94  soft-tissue]
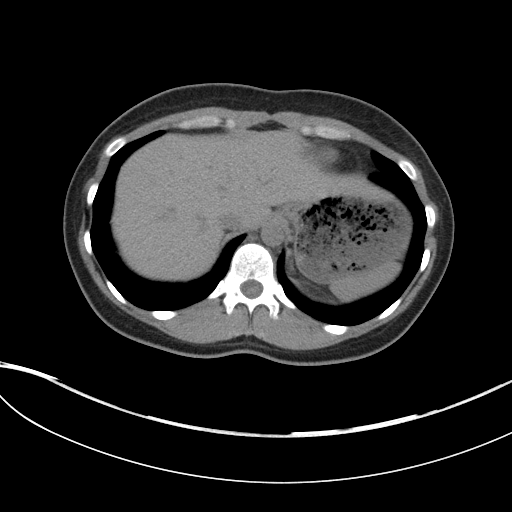
[im 81/94  soft-tissue]
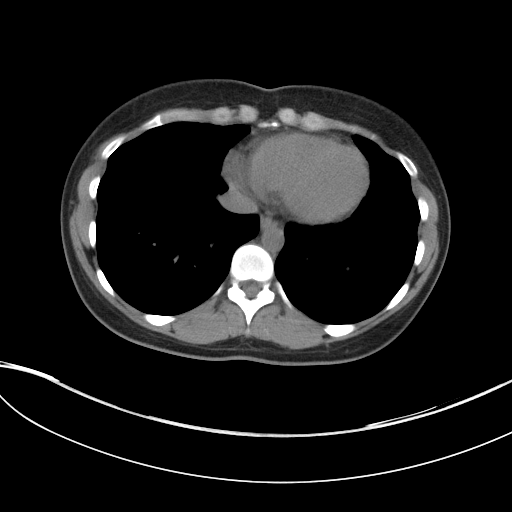
[im 89/94  soft-tissue]
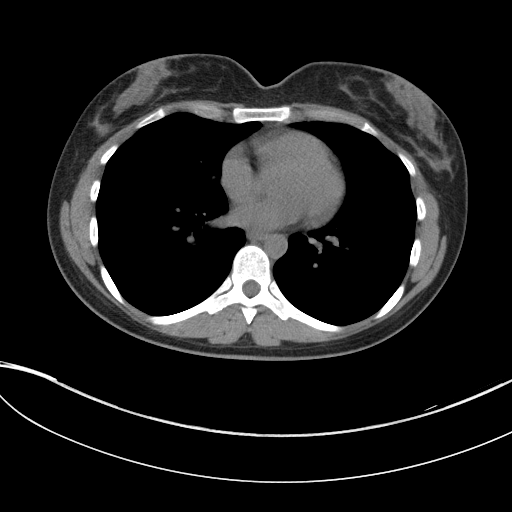

[Series 5: renal stone 3.0 coronal · coronal · 0.60mm/px · 3 of 69 slices shown]
[im 23/69  soft-tissue]
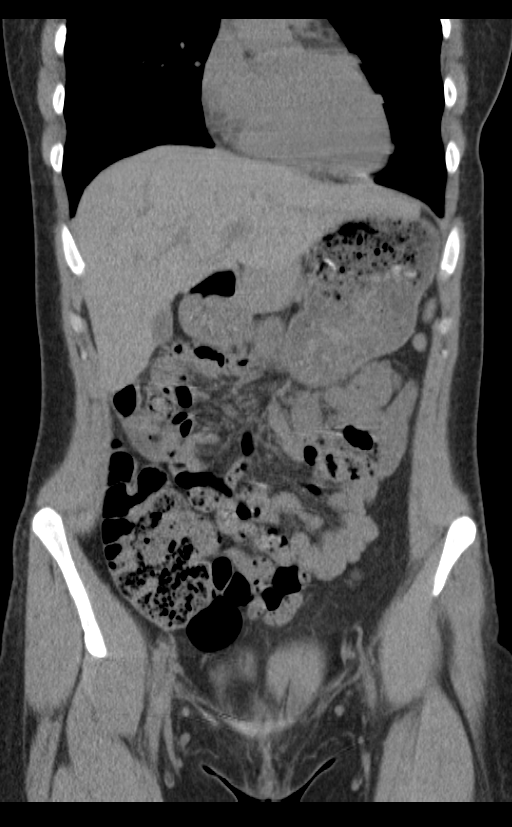
[im 31/69  soft-tissue]
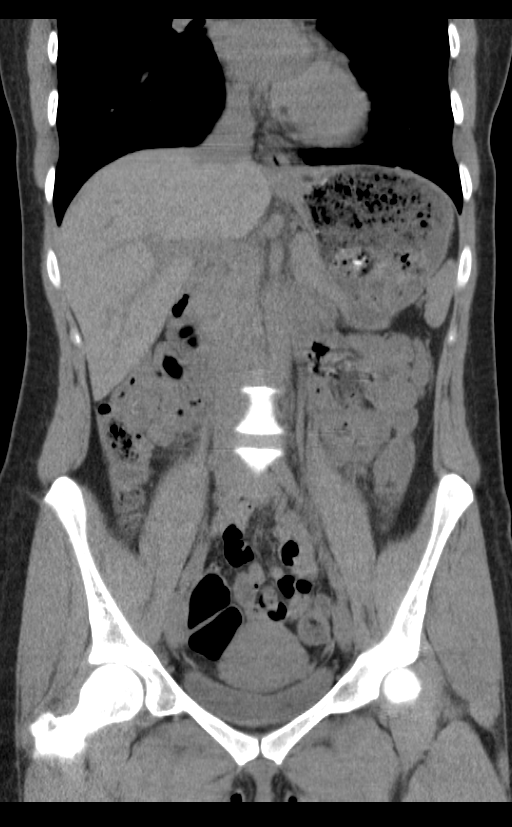
[im 38/69  soft-tissue]
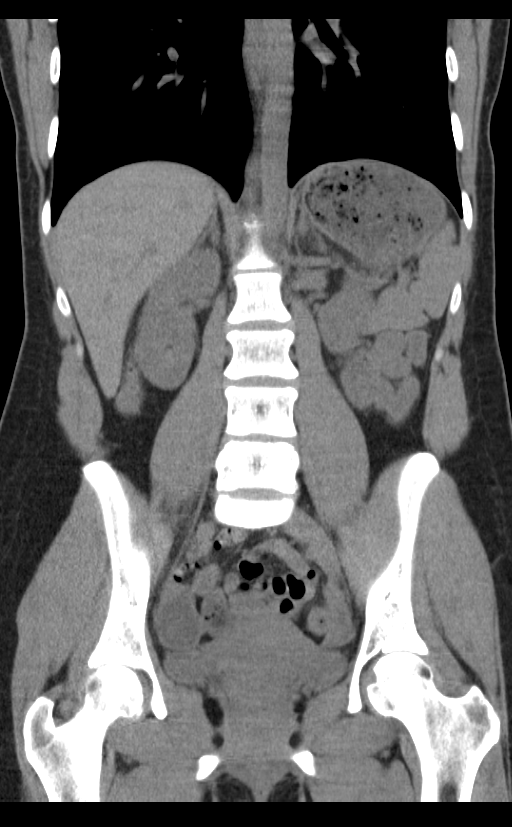

[15 of 46 positions shown; findings below may reference images not displayed]

FINDINGS: The lung bases are clear.

The kidneys appear symmetrical in size and shape.  No
pyelocaliectasis or ureterectasis.  No renal, ureteral, or bladder
stones.  The bladder is decompressed and cannot be evaluated for
wall thickening.

The unenhanced appearance of the liver, spleen, gallbladder,
pancreas, adrenal glands, abdominal aorta, and retroperitoneal
lymph nodes is unremarkable.  The stomach, small bowel, and colon
are mostly decompressed.  No free air or free fluid in the abdomen.

Pelvis:  The uterus is not enlarged.  The ovaries appear
symmetrical.  No free or loculated pelvic fluid collections.  No
diverticulitis.  The appendix is normal.  Normal alignment of the
lumbar vertebrae.  Lumbar spine, pelvis, and hips appear intact.
IMPRESSION: No renal or ureteral stone or obstruction.  No acute process
demonstrated in the abdomen or pelvis on unenhanced imaging.

## 2014-05-21 MED ORDER — PREDNISONE 20 MG PO TABS: ORAL_TABLET | ORAL | Status: DC

## 2014-05-21 MED ORDER — BENZONATATE 100 MG PO CAPS: 100.0000 mg | ORAL_CAPSULE | Freq: Four times a day (QID) | ORAL | Status: DC | PRN

## 2014-05-21 MED ORDER — AZITHROMYCIN 250 MG PO TABS: ORAL_TABLET | ORAL | Status: DC

## 2014-05-21 NOTE — Telephone Encounter (Signed)
EVISIT TELEPHONE Patient called the office for a telephone visit complaining of symptoms of a URI. Symptoms include bilateral ear pressure/pain, low grade fever, nasal congestion, post nasal drip, productive cough with  clear colored sputum, sinus pressure, sore throat and chills, headache. Onset of symptoms was 1 day ago, and has been gradually worsening since that time. Treatment to date: Dayquil.  PLAN: URI- Discussed diagnosis and treatment of URI. Discussed the importance of avoiding unnecessary antibiotic therapy. Suggested symptomatic OTC remedies. Nasal saline spray for congestion. Zithromax per orders. Follow up as needed. Follow up in 10 days or as needed.

## 2014-05-28 ENCOUNTER — Telehealth: Payer: Self-pay | Admitting: Physician Assistant

## 2014-05-28 MED ORDER — PROMETHAZINE-DM 6.25-15 MG/5ML PO SYRP
5.0000 mL | ORAL_SOLUTION | Freq: Four times a day (QID) | ORAL | Status: DC | PRN
Start: 2014-05-28 — End: 2014-07-01

## 2014-05-28 MED ORDER — ALBUTEROL SULFATE HFA 108 (90 BASE) MCG/ACT IN AERS: 1.0000 | INHALATION_SPRAY | Freq: Four times a day (QID) | RESPIRATORY_TRACT | Status: DC | PRN

## 2014-05-28 NOTE — Telephone Encounter (Signed)
Treated 02/19 for cold, called today stating still has cough, unable to sleep. Will send in cough syrup and albuterol inhaler, otherwise needs OV here or UC.

## 2014-07-01 ENCOUNTER — Encounter: Payer: Self-pay | Admitting: Emergency Medicine

## 2014-07-01 ENCOUNTER — Ambulatory Visit (INDEPENDENT_AMBULATORY_CARE_PROVIDER_SITE_OTHER): Payer: 59 | Admitting: Emergency Medicine

## 2014-07-01 VITALS — BP 122/70 | HR 80 | Temp 98.6°F | Resp 18 | Ht 65.0 in | Wt 135.0 lb

## 2014-07-01 DIAGNOSIS — R5383 Other fatigue: Secondary | ICD-10-CM

## 2014-07-01 DIAGNOSIS — R5381 Other malaise: Secondary | ICD-10-CM

## 2014-07-01 DIAGNOSIS — N912 Amenorrhea, unspecified: Secondary | ICD-10-CM

## 2014-07-01 DIAGNOSIS — Z8249 Family history of ischemic heart disease and other diseases of the circulatory system: Secondary | ICD-10-CM

## 2014-07-01 DIAGNOSIS — Z0000 Encounter for general adult medical examination without abnormal findings: Secondary | ICD-10-CM

## 2014-07-01 DIAGNOSIS — R239 Unspecified skin changes: Secondary | ICD-10-CM

## 2014-07-01 DIAGNOSIS — E559 Vitamin D deficiency, unspecified: Secondary | ICD-10-CM

## 2014-07-01 DIAGNOSIS — R05 Cough: Secondary | ICD-10-CM

## 2014-07-01 DIAGNOSIS — J302 Other seasonal allergic rhinitis: Secondary | ICD-10-CM

## 2014-07-01 DIAGNOSIS — R6889 Other general symptoms and signs: Secondary | ICD-10-CM

## 2014-07-01 DIAGNOSIS — R059 Cough, unspecified: Secondary | ICD-10-CM

## 2014-07-01 DIAGNOSIS — Z0001 Encounter for general adult medical examination with abnormal findings: Secondary | ICD-10-CM

## 2014-07-01 LAB — CBC WITH DIFFERENTIAL/PLATELET
BASOS ABS: 0.1 10*3/uL (ref 0.0–0.1)
Basophils Relative: 1 % (ref 0–1)
Eosinophils Absolute: 0.1 10*3/uL (ref 0.0–0.7)
Eosinophils Relative: 1 % (ref 0–5)
HEMATOCRIT: 39.8 % (ref 36.0–46.0)
Hemoglobin: 13.2 g/dL (ref 12.0–15.0)
Lymphocytes Relative: 22 % (ref 12–46)
Lymphs Abs: 2 10*3/uL (ref 0.7–4.0)
MCH: 29.9 pg (ref 26.0–34.0)
MCHC: 33.2 g/dL (ref 30.0–36.0)
MCV: 90 fL (ref 78.0–100.0)
MONOS PCT: 7 % (ref 3–12)
MPV: 11.7 fL (ref 8.6–12.4)
Monocytes Absolute: 0.7 10*3/uL (ref 0.1–1.0)
NEUTROS PCT: 69 % (ref 43–77)
Neutro Abs: 6.4 10*3/uL (ref 1.7–7.7)
Platelets: 335 10*3/uL (ref 150–400)
RBC: 4.42 MIL/uL (ref 3.87–5.11)
RDW: 13.3 % (ref 11.5–15.5)
WBC: 9.3 10*3/uL (ref 4.0–10.5)

## 2014-07-01 LAB — LIPID PANEL
CHOL/HDL RATIO: 2.1 ratio
CHOLESTEROL: 132 mg/dL (ref 0–200)
HDL: 62 mg/dL (ref >=46)
LDL CALC: 59 mg/dL (ref 0–99)
Triglycerides: 53 mg/dL (ref <150)
VLDL: 11 mg/dL (ref 0–40)

## 2014-07-01 LAB — BASIC METABOLIC PANEL WITH GFR
BUN: 10 mg/dL (ref 6–23)
CO2: 25 mEq/L (ref 19–32)
Calcium: 9.8 mg/dL (ref 8.4–10.5)
Chloride: 104 mEq/L (ref 96–112)
Creat: 0.71 mg/dL (ref 0.50–1.10)
GFR, Est Non African American: >89 mL/min
Glucose, Bld: 93 mg/dL (ref 70–99)
Potassium: 4.7 mEq/L (ref 3.5–5.3)
SODIUM: 138 meq/L (ref 135–145)

## 2014-07-01 LAB — HEPATIC FUNCTION PANEL
ALK PHOS: 70 U/L (ref 39–117)
ALT: 11 U/L (ref 0–35)
AST: 13 U/L (ref 0–37)
Albumin: 4.9 g/dL (ref 3.5–5.2)
Bilirubin, Direct: 0.1 mg/dL (ref 0.0–0.3)
Indirect Bilirubin: 0.2 mg/dL (ref 0.2–1.2)
TOTAL PROTEIN: 7.4 g/dL (ref 6.0–8.3)
Total Bilirubin: 0.3 mg/dL (ref 0.2–1.2)

## 2014-07-01 LAB — MAGNESIUM: Magnesium: 2 mg/dL (ref 1.5–2.5)

## 2014-07-01 LAB — TSH: TSH: 0.208 u[IU]/mL — ABNORMAL LOW (ref 0.350–4.500)

## 2014-07-01 LAB — HCG, SERUM, QUALITATIVE: Preg, Serum: NEGATIVE

## 2014-07-01 NOTE — Patient Instructions (Signed)
Insomnia Insomnia is frequent trouble falling and/or staying asleep. Insomnia can be a long term problem or a short term problem. Both are common. Insomnia can be a short term problem when the wakefulness is related to a certain stress or worry. Long term insomnia is often related to ongoing stress during waking hours and/or poor sleeping habits. Overtime, sleep deprivation itself can make the problem worse. Every little thing feels more severe because you are overtired and your ability to cope is decreased. CAUSES   Stress, anxiety, and depression.  Poor sleeping habits.  Distractions such as TV in the bedroom.  Naps close to bedtime.  Engaging in emotionally charged conversations before bed.  Technical reading before sleep.  Alcohol and other sedatives. They may make the problem worse. They can hurt normal sleep patterns and normal dream activity.  Stimulants such as caffeine for several hours prior to bedtime.  Pain syndromes and shortness of breath can cause insomnia.  Exercise late at night.  Changing time zones may cause sleeping problems (jet lag). It is sometimes helpful to have someone observe your sleeping patterns. They should look for periods of not breathing during the night (sleep apnea). They should also look to see how long those periods last. If you live alone or observers are uncertain, you can also be observed at a sleep clinic where your sleep patterns will be professionally monitored. Sleep apnea requires a checkup and treatment. Give your caregivers your medical history. Give your caregivers observations your family has made about your sleep.  SYMPTOMS   Not feeling rested in the morning.  Anxiety and restlessness at bedtime.  Difficulty falling and staying asleep. TREATMENT   Your caregiver may prescribe treatment for an underlying medical disorders. Your caregiver can give advice or help if you are using alcohol or other drugs for self-medication. Treatment  of underlying problems will usually eliminate insomnia problems.  Medications can be prescribed for short time use. They are generally not recommended for lengthy use.  Over-the-counter sleep medicines are not recommended for lengthy use. They can be habit forming.  You can promote easier sleeping by making lifestyle changes such as:  Using relaxation techniques that help with breathing and reduce muscle tension.  Exercising earlier in the day.  Changing your diet and the time of your last meal. No night time snacks.  Establish a regular time to go to bed.  Counseling can help with stressful problems and worry.  Soothing music and white noise may be helpful if there are background noises you cannot remove.  Stop tedious detailed work at least one hour before bedtime. HOME CARE INSTRUCTIONS   Keep a diary. Inform your caregiver about your progress. This includes any medication side effects. See your caregiver regularly. Take note of:  Times when you are asleep.  Times when you are awake during the night.  The quality of your sleep.  How you feel the next day. This information will help your caregiver care for you.  Get out of bed if you are still awake after 15 minutes. Read or do some quiet activity. Keep the lights down. Wait until you feel sleepy and go back to bed.  Keep regular sleeping and waking hours. Avoid naps.  Exercise regularly.  Avoid distractions at bedtime. Distractions include watching television or engaging in any intense or detailed activity like attempting to balance the household checkbook.  Develop a bedtime ritual. Keep a familiar routine of bathing, brushing your teeth, climbing into bed at the same   time each night, listening to soothing music. Routines increase the success of falling to sleep faster.  Use relaxation techniques. This can be using breathing and muscle tension release routines. It can also include visualizing peaceful scenes. You can  also help control troubling or intruding thoughts by keeping your mind occupied with boring or repetitive thoughts like the old concept of counting sheep. You can make it more creative like imagining planting one beautiful flower after another in your backyard garden.  During your day, work to eliminate stress. When this is not possible use some of the previous suggestions to help reduce the anxiety that accompanies stressful situations. MAKE SURE YOU:   Understand these instructions.  Will watch your condition.  Will get help right away if you are not doing well or get worse. Document Released: 03/16/2000 Document Revised: 06/11/2011 Document Reviewed: 04/16/2007 [facility]Exit[facility] Patient Information 2015 [place], [place]. This information is not intended to replace advice given to you by your health care provider. Make sure you discuss any questions you have with your health care provider. Primary Amenorrhea  Primary amenorrhea is the absence of any menstrual flow in a female by the age of 15 years. An average age for the start of menstruation is the age of 12 years. Primary amenorrhea is not considered to have occurred until a female is older than 15 years and has never menstruated. This may occur with or without other signs of puberty. CAUSES  Some common causes of not menstruating include:  Chromosomal abnormality causing the ovaries to malfunction is the most common cause of primary amenorrhea.  Malnutrition.  Low blood sugar (hypoglycemia).  Polycystic ovary syndrome (cysts in the ovaries, not ovulating).  Absence of the vagina, uterus, or ovaries since birth (congenital).  Extreme obesity.  Cystic fibrosis.  Drastic weight loss from any cause.  Over-exercising (running, biking) causing loss of body fat.  Pituitary gland tumor in the brain.  Long-term (chronic) illnesses.  Cushing disease.  Thyroid disease (hypothyroidism, hyperthyroidism).  Part of the brain (hypothalamus)  not functioning normally.  Premature ovarian failure. SYMPTOMS  No menstruation by age 29 years in normally developed females is the primary symptom. Other symptoms may include:  Discharge from the breasts.  Hot flashes.  Adult acne.  Facial or chest hair.  Headaches.  Impaired vision.  Recent stress.  Changes in weight, diet, or exercise patterns. DIAGNOSIS  Primary amenorrhea is diagnosed with the help of a medical history and a physical exam. Other tests that may be recommended include:  Blood tests to check for pregnancy, hormonal changes, a bleeding or thyroid disorder, low iron levels (anemia), or other problems.  Urine tests.  Specialized X-ray exams. TREATMENT  Treatment will depend on the cause. For example, some of the causes of primary amenorrhea, such as congenital absence of sex organs, will require surgery to correct. Others may respond to treatment with medicine. SEEK MEDICAL CARE IF:  There has not been any menstrual flow by age 29 years.  Body maturation does not occur at a level typical of peers.  Pelvic area pain occurs.  There is unusual weight gain or hair growth. Document Released: 03/19/2005 Document Revised: 01/07/2013 Document Reviewed: 10/29/2012 [facility]tonExitCare Patient Information 2015 [place]Care, [place]LL[place]. This information is not intended to replace advice given to you by your health care provider. Make sure you discuss any questions you have with your health care provider.

## 2014-07-01 NOTE — Progress Notes (Signed)
Subjective:    Patient ID: Sara Brewer, female    DOB: Jan 04, 1986, 29 y.o.   MRN: [id number]  HPI Comments: 24 yoWF CPE with + FHX of cardiac disease. She denies CV symptoms. She exercises routinely and eats healthy.   LMP 05/15/14 and is concerned she may be pregnant. She had NEG pregnancy test 2 days. She has been nauseated, moody, and fatigued for several weeks. She has stopped all medicine with concern for pregnancy. She notes she has not been sleeping well x several weeks and feels it is related to her hormones.   She notes Reiki has helped with vertigo, anxiety. She has not had to take medicine in over several months.      Medication List       This list is accurate as of: 07/01/14 11:59 PM.  Always use your most recent med list.               albuterol 108 (90 BASE) MCG/ACT inhaler  Commonly known as:  PROVENTIL HFA;VENTOLIN HFA  Inhale 1-2 puffs into the lungs every 6 (six) hours as needed for wheezing or shortness of breath.     cyanocobalamin 1000 MCG/ML injection  Commonly known as:  (VITAMIN B-12)  Inject 1 mL (1,000 mcg total) into the muscle every 30 (thirty) days.     multivitamin with minerals tablet  Take 1 tablet by mouth daily.        Allergies  Allergen Reactions  . Codeine Nausea And Vomiting  . Milk-Related Compounds Hives and Nausea And Vomiting  . Sulfonamide Derivatives Nausea And Vomiting  . Penicillins Nausea And Vomiting    PER PATIENT just nausea and vomiting   Past Medical History  Diagnosis Date  . Back pain   . ADD (attention deficit disorder with hyperactivity)   . Pelvic pain in female   . Graves disease 2014    Dr. Evlyn Kanner   . GERD (gastroesophageal reflux disease)   . Anemia     iron  . Anxiety   . Vitamin B12 deficiency   . Vitamin D deficiency    Past Surgical History  Procedure Laterality Date  . Wisdom tooth extraction    . Gum grafts    . Laparoscopy  02/19/2011    Procedure: LAPAROSCOPY DIAGNOSTIC;  Surgeon: Lum Keas;  Location: [facility];  Service: Gynecology;  Laterality: N/A;  Diagnostic Laparoscopy   History  Substance Use Topics  . Smoking status: Never Smoker   . Smokeless tobacco: Never Used  . Alcohol Use: No   Family History  Problem Relation Age of Onset  . Heart murmur Brother     unsure of extend but will require surgery  . Heart disease Brother     valve  . Stroke Mother 68    mini  . Cancer Father     melanoma  . Cancer Maternal Uncle     melanoma  . Cancer Maternal Grandmother 51    breast  . Heart disease Maternal Grandmother   . Heart disease Maternal Grandfather   . Cancer Paternal Grandmother     lung/ liver/ bone  . Heart disease Paternal Grandmother   . Cancer Paternal Grandfather     melanoma  . Heart disease Paternal Grandfather   . Hyperlipidemia Paternal Grandfather   . Hypertension Paternal Grandfather    MAINTENANCE: Colonoscopy:n/a Mammo: U/S 2009 WNL Pap/ Pelvic:2014 WNL WUJ:[id number] 2014 WNL Dentist:OVERDUE CT ABD/p: 2014  IMMUNIZATIONS: Td: 2006 Influenza: Declines  Patient  Care Team: Lucky CowboyWilliam McKeown, MD as PCP - General (Internal Medicine) Iva Booparl E Gessner, MD as Consulting Physician (Gastroenterology) Jerene BearsMary S Miller, MD as Consulting Physician (Gynecology) Gae DryZoe Draelos, MD (Dermatology)   Review of Systems  Constitutional: Positive for fatigue. Negative for fever.  HENT: Positive for postnasal drip.   Respiratory: Positive for cough.   Gastrointestinal: Positive for nausea and abdominal pain.  Genitourinary: Positive for menstrual problem.  Psychiatric/Behavioral: Positive for sleep disturbance. Negative for suicidal ideas. The patient is not nervous/anxious.   All other systems reviewed and are negative.  BP 122/70 mmHg  Pulse 80  Temp(Src) 98.6 F (37 C) (Temporal)  Resp 18  Ht 5\' 5"  (1.651 m)  Wt 135 lb (61.236 kg)  BMI 22.47 kg/m2     Objective:   Physical Exam  Constitutional: She is oriented to person, place,  and time. She appears well-developed and well-nourished. No distress.  HENT:  Head: Normocephalic.  Nose: Nose normal.  Mouth/Throat: Oropharynx is clear and moist.  Cobblestones posterior pharynx, Cloudy TM's bilaterally   Eyes: Conjunctivae and EOM are normal. Pupils are equal, round, and reactive to light. No scleral icterus.  Neck: Normal range of motion. Neck supple. No JVD present. No thyromegaly present.  Cardiovascular: Normal rate, regular rhythm, normal heart sounds and intact distal pulses.   Pulmonary/Chest: Effort normal and breath sounds normal.  Abdominal: Soft. Bowel sounds are normal. She exhibits no distension and no mass. There is no tenderness.  Genitourinary:  DEF GYN with AMenorrhea and possible pregnancy  Musculoskeletal: Normal range of motion. She exhibits no edema or tenderness.  Lymphadenopathy:    She has no cervical adenopathy.  Neurological: She is alert and oriented to person, place, and time. She has normal reflexes. No cranial nerve deficit. She exhibits normal muscle tone. Coordination normal.  Skin: Skin is warm and dry. No rash noted. No erythema.  Right low abdomen dark flat irregular 3 mm nevi. [facility]  Psychiatric: She has a normal mood and affect. Her behavior is normal. Judgment and thought content normal.  Nursing note and vitals reviewed.       Assessment & Plan:  1. CPE- Update screening labs/ History/ Immunizations/ Testing as needed. Advised healthy diet, QD exercise, increase H20 and continue RX/ Vitamins AD.   2. Abnormal menses/ amenorrhea- GYN appointment needed ASAP, check pregnancy status  3. Cough vs allergies vs reflux-  Start Claritin OTC, increase H2o, allergy hygiene explained. If no change possibly reflux advised bland diet.  4. Mood change/ Anxiety/ Insomnia- Anxiety Controlled currently with Reiki,  w/c if SX increase or ER, recommend counseling if symptoms continue. Sleep hygiene discussed  And advised may help with  mood. Check labs  5.  Irreg Nevi- monitor for any change, call if for removal with DERM   6. Fatigue- check labs, increase activity and H2O

## 2014-07-02 LAB — URINALYSIS, ROUTINE W REFLEX MICROSCOPIC
BILIRUBIN URINE: NEGATIVE
GLUCOSE, UA: NEGATIVE mg/dL
Hgb urine dipstick: NEGATIVE
Leukocytes, UA: NEGATIVE
Nitrite: NEGATIVE
PROTEIN: NEGATIVE mg/dL
SPECIFIC GRAVITY, URINE: 1.016 (ref 1.005–1.030)
Urobilinogen, UA: 0.2 mg/dL (ref 0.0–1.0)
pH: 6.5 (ref 5.0–8.0)

## 2014-07-02 LAB — VITAMIN D 25 HYDROXY (VIT D DEFICIENCY, FRACTURES): VIT D 25 HYDROXY: 18 ng/mL — AB (ref 30–100)

## 2014-07-19 ENCOUNTER — Other Ambulatory Visit: Payer: Self-pay

## 2014-09-13 ENCOUNTER — Encounter: Payer: Self-pay | Admitting: Internal Medicine

## 2014-09-13 ENCOUNTER — Ambulatory Visit (INDEPENDENT_AMBULATORY_CARE_PROVIDER_SITE_OTHER): Payer: 59 | Admitting: Internal Medicine

## 2014-09-13 ENCOUNTER — Other Ambulatory Visit (HOSPITAL_COMMUNITY)
Admission: RE | Admit: 2014-09-13 | Discharge: 2014-09-13 | Disposition: A | Discharge status: Home / Self Care | Payer: 59 | Source: Ambulatory Visit | Attending: Internal Medicine | Admitting: Internal Medicine

## 2014-09-13 VITALS — BP 112/68 | HR 60 | Temp 98.6°F | Resp 18 | Ht 65.0 in | Wt 140.0 lb

## 2014-09-13 DIAGNOSIS — Z23 Encounter for immunization: Secondary | ICD-10-CM

## 2014-09-13 DIAGNOSIS — Z01419 Encounter for gynecological examination (general) (routine) without abnormal findings: Secondary | ICD-10-CM | POA: Diagnosis not present

## 2014-09-13 DIAGNOSIS — Z124 Encounter for screening for malignant neoplasm of cervix: Secondary | ICD-10-CM

## 2014-09-13 DIAGNOSIS — F909 Attention-deficit hyperactivity disorder, unspecified type: Secondary | ICD-10-CM

## 2014-09-13 DIAGNOSIS — F988 Other specified behavioral and emotional disorders with onset usually occurring in childhood and adolescence: Secondary | ICD-10-CM

## 2014-09-13 DIAGNOSIS — Z30011 Encounter for initial prescription of contraceptive pills: Secondary | ICD-10-CM

## 2014-09-13 DIAGNOSIS — Z309 Encounter for contraceptive management, unspecified: Secondary | ICD-10-CM

## 2014-09-13 MED ORDER — AMPHETAMINE-DEXTROAMPHETAMINE 30 MG PO TABS: 30.0000 mg | ORAL_TABLET | Freq: Every day | ORAL | Status: DC

## 2014-09-13 MED ORDER — NORETHINDRONE 0.35 MG PO TABS: 1.0000 | ORAL_TABLET | Freq: Every day | ORAL | Status: DC

## 2014-09-13 NOTE — Patient Instructions (Signed)
Norethindrone tablets (contraception) What is this medicine? NORETHINDRONE (nor eth IN drone) is an oral contraceptive. The product contains a female hormone known as a progestin. It is used to prevent pregnancy. This medicine may be used for other purposes; ask your health care provider or pharmacist if you have questions. COMMON BRAND NAME(S): Camila, Deblitane 28-Day, Errin, Heather, [place], Jolivette, [place], Nor-QD, Nora-BE, Norlyroc, Ortho Micronor, [employer] 28-Day What should I tell my health care provider before I take this medicine? They need to know if you have any of these conditions: -blood vessel disease or blood clots -breast, cervical, or vaginal cancer -diabetes -heart disease -kidney disease -liver disease -mental depression -migraine -seizures -stroke -vaginal bleeding -an unusual or allergic reaction to norethindrone, other medicines, foods, dyes, or preservatives -pregnant or trying to get pregnant -breast-feeding How should I use this medicine? Take this medicine by mouth with a glass of water. You may take it with or without food. Follow the directions on the prescription label. Take this medicine at the same time each day and in the order directed on the package. Do not take your medicine more often than directed. Contact your pediatrician regarding the use of this medicine in children. Special care may be needed. This medicine has been used in female children who have started having menstrual periods. A patient package insert for the product will be given with each prescription and refill. Read this sheet carefully each time. The sheet may change frequently. Overdosage: If you think you have taken too much of this medicine contact a poison control center or emergency room at once. NOTE: This medicine is only for you. Do not share this medicine with others. What if I miss a dose? Try not to miss a dose. Every time you miss a dose or take a dose late your chance of  pregnancy increases. When 1 pill is missed (even if only 3 hours late), take the missed pill as soon as possible and continue taking a pill each day at the regular time (use a back up method of birth control for the next 48 hours). If more than 1 dose is missed, use an additional birth control method for the rest of your pill pack until menses occurs. Contact your health care professional if more than 1 dose has been missed. What may interact with this medicine? Do not take this medicine with any of the following medications: -amprenavir or fosamprenavir -bosentan This medicine may also interact with the following medications: -antibiotics or medicines for infections, especially rifampin, rifabutin, rifapentine, and griseofulvin, and possibly penicillins or tetracyclines -aprepitant -barbiturate medicines, such as phenobarbital -carbamazepine -felbamate -modafinil -oxcarbazepine -phenytoin -ritonavir or other medicines for HIV infection or AIDS -St. John's wort -topiramate This list may not describe all possible interactions. Give your health care provider a list of all the medicines, herbs, non-prescription drugs, or dietary supplements you use. Also tell them if you smoke, drink alcohol, or use illegal drugs. Some items may interact with your medicine. What should I watch for while using this medicine? Visit your doctor or health care professional for regular checks on your progress. You will need a regular breast and pelvic exam and Pap smear while on this medicine. Use an additional method of birth control during the first cycle that you take these tablets. If you have any reason to think you are pregnant, stop taking this medicine right away and contact your doctor or health care professional. If you are taking this medicine for hormone related problems, it  may take several cycles of use to see improvement in your condition. This medicine does not protect you against HIV infection (AIDS)  or any other sexually transmitted diseases. What side effects may I notice from receiving this medicine? Side effects that you should report to your doctor or health care professional as soon as possible: -breast tenderness or discharge -pain in the abdomen, chest, groin or leg -severe headache -skin rash, itching, or hives -sudden shortness of breath -unusually weak or tired -vision or speech problems -yellowing of skin or eyes Side effects that usually do not require medical attention (report to your doctor or health care professional if they continue or are bothersome): -changes in sexual desire -change in menstrual flow -facial hair growth -fluid retention and swelling -headache -irritability -nausea -weight gain or loss This list may not describe all possible side effects. Call your doctor for medical advice about side effects. You may report side effects to FDA at 1-800-FDA-1088. Where should I keep my medicine? Keep out of the reach of children. Store at room temperature between 15 and 30 degrees C (59 and 86 degrees F). Throw away any unused medicine after the expiration date. NOTE: This sheet is a summary. It may not cover all possible information. If you have questions about this medicine, talk to your doctor, pharmacist, or health care provider.  2015, Elsevier/Gold Standard. (2011-12-07 16:41:35)  Pap Test A Pap test is a procedure done in a clinic office to evaluate cells that are on the surface of the cervix. The cervix is the lower portion of the uterus and upper portion of the vagina. For some women, the cervical region has the potential to form cancer. With consistent evaluations by your caregiver, this type of cancer can be prevented.  If a Pap test is abnormal, it is most often a result of a previous exposure to human papillomavirus (HPV). HPV is a virus that can infect the cells of the cervix and cause dysplasia. Dysplasia is where the cells no longer look normal. If  a woman has been diagnosed with high-grade or severe dysplasia, they are at higher risk of developing cervical cancer. People diagnosed with low-grade dysplasia should still be seen by their caregiver because there is a small chance that low-grade dysplasia could develop into cancer.  LET YOUR CAREGIVER KNOW ABOUT:  Recent sexually transmitted infection (STI) you have had.  Any new sex partners you have had.  History of previous abnormal Pap tests results.  History of previous cervical procedures you have had (colposcopy, biopsy, loop electrosurgical excision procedure [LEEP]).  Concerns you have had regarding unusual vaginal discharge.  History of pelvic pain.  Your use of birth control. BEFORE THE PROCEDURE  Ask your caregiver when to schedule your Pap test. It is best not to be on your period if your caregiver uses a wooden spatula to collect cells or applies cells to a glass slide. Newer techniques are not so sensitive to the timing of a menstrual cycle.  Do not douche or have sexual intercourse for 24 hours before the test.   Do not use vaginal creams or tampons for 24 hours before the test.   Empty your bladder just before the test to lessen any discomfort.  PROCEDURE You will lie on an exam table with your feet in stirrups. A warm metal or plastic instrument (speculum) is placed in your vagina. This instrument allows your caregiver to see the inside of your vagina and look at your cervix. A small, plastic brush  or wooden spatula is then used to collect cervical cells. These cells are placed in a lab specimen container. The cells are looked at under a microscope. A specialist will determine if the cells are normal.  AFTER THE PROCEDURE Make sure to get your test results.If your results come back abnormal, you may need further testing.  Document Released: 06/09/2002 Document Revised: 06/11/2011 Document Reviewed: 03/15/2011 [facility] Patient Information 2015 [place], [place].  This information is not intended to replace advice given to you by your health care provider. Make sure you discuss any questions you have with your health care provider.

## 2014-09-13 NOTE — Progress Notes (Signed)
   Subjective:    Patient ID: Sara Brewer, female    DOB: Dec 28, 1985, 29 y.o.   MRN: [id number]  HPI  Patient presents to the office for evaluation of need for cervical cancer screening and also for updating of tetanus shot.  She reports that she just had a period which started last Tuesday and just ended.  She reports that her periods are normalizing since coming off the implanon and is now having them every 28 days.  She would like to go back on progesterone only minipills.  She does report she has had normal pap last year.  She does have a history of a leap procedure approximately 4 years ago.  She does not currently see ObGyn.     Review of Systems  Constitutional: Negative for fever, chills and fatigue.  Respiratory: Negative for chest tightness and shortness of breath.   Cardiovascular: Negative for chest pain.  Gastrointestinal: Negative for nausea and vomiting.  Genitourinary: Negative for dysuria, urgency, frequency, hematuria, vaginal bleeding, vaginal discharge and difficulty urinating.  Psychiatric/Behavioral: Positive for decreased concentration. The patient is hyperactive.        Objective:   Physical Exam  Constitutional: She is oriented to person, place, and time. She appears well-developed and well-nourished.  HENT:  Head: Normocephalic and atraumatic.  Mouth/Throat: Oropharynx is clear and moist. No oropharyngeal exudate.  Eyes: Conjunctivae are normal. No scleral icterus.  Neck: Normal range of motion. Neck supple. No JVD present. No thyromegaly present.  Cardiovascular: Normal rate, regular rhythm, normal heart sounds and intact distal pulses.  Exam reveals no gallop and no friction rub.   No murmur heard. Pulmonary/Chest: Effort normal and breath sounds normal. No respiratory distress. She has no wheezes. She has no rales. She exhibits no tenderness.  Abdominal: Soft. Bowel sounds are normal. She exhibits no distension and no mass. There is no tenderness. There  is no rebound and no guarding.  Genitourinary: Vagina normal and uterus normal.  Musculoskeletal: Normal range of motion.  Lymphadenopathy:    She has no cervical adenopathy.  Neurological: She is alert and oriented to person, place, and time.  Skin: Skin is warm and dry.  Psychiatric: She has a normal mood and affect. Her behavior is normal. Judgment and thought content normal.  Nursing note reviewed.         Assessment & Plan:    1. Screening for cervical cancer  - Cytology -Pap Smear  2. OCP (oral contraceptive pills) initiation -camila -suggested backup method for at least 1 month  3. ADD (attention deficit disorder) -adderall prescription given  4. Need for prophylactic vaccination with combined diphtheria-tetanus-pertussis (DTP) vaccine  - Tdap vaccine greater than or equal to 7yo IM

## 2014-09-16 LAB — CYTOLOGY - PAP

## 2014-10-27 ENCOUNTER — Other Ambulatory Visit: Payer: Self-pay | Admitting: Internal Medicine

## 2014-10-27 MED ORDER — AMPHETAMINE-DEXTROAMPHETAMINE 30 MG PO TABS: 30.0000 mg | ORAL_TABLET | Freq: Every day | ORAL | Status: DC

## 2015-04-28 ENCOUNTER — Ambulatory Visit (INDEPENDENT_AMBULATORY_CARE_PROVIDER_SITE_OTHER): Payer: Self-pay | Admitting: Internal Medicine

## 2015-04-28 ENCOUNTER — Encounter: Payer: Self-pay | Admitting: Internal Medicine

## 2015-04-28 VITALS — BP 122/80 | HR 76 | Temp 97.5°F | Resp 16 | Ht 65.0 in | Wt 143.6 lb

## 2015-04-28 DIAGNOSIS — F988 Other specified behavioral and emotional disorders with onset usually occurring in childhood and adolescence: Secondary | ICD-10-CM

## 2015-04-28 DIAGNOSIS — Z8619 Personal history of other infectious and parasitic diseases: Secondary | ICD-10-CM | POA: Diagnosis not present

## 2015-04-28 DIAGNOSIS — F909 Attention-deficit hyperactivity disorder, unspecified type: Secondary | ICD-10-CM

## 2015-04-28 MED ORDER — AMPHETAMINE-DEXTROAMPHETAMINE 30 MG PO TABS: 30.0000 mg | ORAL_TABLET | Freq: Every day | ORAL | Status: DC

## 2015-04-28 MED ORDER — NORETHINDRONE 0.35 MG PO TABS: 1.0000 | ORAL_TABLET | Freq: Every day | ORAL | Status: DC

## 2015-04-28 NOTE — Progress Notes (Signed)
  Subjective:    Patient ID: Sara Brewer, female    DOB: 1985/12/12, 30 y.o.   MRN: [id number]  HPI  This very nice 30 yo SWF with ADD present s for med refills and relate ongoing benefit with Adderall helping her to keep focused and be productive at work as an Warden/ranger. 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   Outpatient Prescriptions Prior to Visit  Medication Sig Dispense Refill  . Multiple Vitamins-Minerals (MULTIVITAMIN WITH MINERALS) tablet Take 1 tablet by mouth daily.      Marland Kitchen amphetamine-dextroamphetamine (ADDERALL) 30 MG tablet Take 1 tablet by mouth daily. (Patient not taking: Reported on 04/28/2015) 30 tablet 0  . amphetamine-dextroamphetamine (ADDERALL) 30 MG tablet Take 1 tablet by mouth daily. (Patient not taking: Reported on 04/28/2015) 90 tablet 0  . cyanocobalamin (,VITAMIN B-12,) 1000 MCG/ML injection Inject 1 mL (1,000 mcg total) into the muscle every 30 (thirty) days. (Patient not taking: Reported on 04/28/2015) 10 mL 1  . norethindrone (MICRONOR,CAMILA,ERRIN) 0.35 MG tablet Take 1 tablet (0.35 mg total) by mouth daily. (Patient not taking: Reported on 04/28/2015) 1 Package 11   No facility-administered medications prior to visit.   Allergies  Allergen Reactions  . Codeine Nausea And Vomiting  . Milk-Related Compounds Hives and Nausea And Vomiting  . Sulfonamide Derivatives Nausea And Vomiting  . Penicillins Nausea And Vomiting    PER PATIENT just nausea and vomiting   Past Medical History  Diagnosis Date  . Back pain   . ADD (attention deficit disorder with hyperactivity)   . Pelvic pain in female   . Graves disease 2014    Dr. Evlyn Kanner   . GERD (gastroesophageal reflux disease)   . Anemia    iron  . Anxiety   . Vitamin B12 deficiency   . Vitamin D deficiency    Review of Systems  10 point systems review negative except as above.    Objective:   Physical Exam  BP 122/80 mmHg  Pulse 76  Temp(Src) 97.5 F (36.4 C)  Resp 16  Ht  (1.651 m)  Wt 143 lb 9.6 oz (65.137 kg)  BMI 23.90 kg/m2  HEENT - Eac's patent. TM's Nl. EOM's full. PERRLA. NasoOroPharynx clear. Neck - supple. Nl Thyroid. Carotids 2+ & No bruits, nodes, JVD Chest - Clear. Cor - Nl HS. RRR w/o sig MGR.  MS- FROM w/o deformities. Muscle power, tone and bulk Nl. Gait Nl. Neuro - Nl w/o focal abnormalities. Psyche - Mental status normal & appropriate.    Assessment & Plan:   1) ADD  - refill Adderall  2) Hx/o EB Viral infection  - Refer for genetic counseling at her request

## 2015-04-28 NOTE — Patient Instructions (Signed)
Attention Deficit Hyperactivity Disorder  Attention deficit hyperactivity disorder (ADHD) is a problem with behavior issues based on the way the brain functions (neurobehavioral disorder). It is a common reason for behavior and academic problems in school.  SYMPTOMS   There are 3 types of ADHD. The 3 types and some of the symptoms include:  · Inattentive.    Gets bored or distracted easily.    Loses or forgets things. Forgets to hand in homework.    Has trouble organizing or completing tasks.    Difficulty staying on task.    An inability to organize daily tasks and school work.    Leaving projects, chores, or homework unfinished.    Trouble paying attention or responding to details. Careless mistakes.    Difficulty following directions. Often seems like is not listening.    Dislikes activities that require sustained attention (like chores or homework).  · Hyperactive-impulsive.    Feels like it is impossible to sit still or stay in a seat. Fidgeting with hands and feet.    Trouble waiting turn.    Talking too much or out of turn. Interruptive.    Speaks or acts impulsively.    Aggressive, disruptive behavior.    Constantly busy or on the go; noisy.    Often leaves seat when they are expected to remain seated.    Often runs or climbs where it is not appropriate, or feels very restless.  · Combined.    Has symptoms of both of the above.  Often children with ADHD feel discouraged about themselves and with school. They often perform well below their abilities in school.  As children get older, the excess motor activities can calm down, but the problems with paying attention and staying organized persist. Most children do not outgrow ADHD but with good treatment can learn to cope with the symptoms.  DIAGNOSIS   When ADHD is suspected, the diagnosis should be made by professionals trained in ADHD. This professional will collect information about the individual suspected of having ADHD. Information must be collected from  various settings where the person lives, works, or attends school.    Diagnosis will include:  · Confirming symptoms began in childhood.  · Ruling out other reasons for the child's behavior.  · The health care providers will check with the child's school and check their medical records.  · They will talk to teachers and parents.  · Behavior rating scales for the child will be filled out by those dealing with the child on a daily basis.  A diagnosis is made only after all information has been considered.  TREATMENT   Treatment usually includes behavioral treatment, tutoring or extra support in school, and stimulant medicines. Because of the way a person's brain works with ADHD, these medicines decrease impulsivity and hyperactivity and increase attention. This is different than how they would work in a person who does not have ADHD. Other medicines used include antidepressants and certain blood pressure medicines.  Most experts agree that treatment for ADHD should address all aspects of the person's functioning. Along with medicines, treatment should include structured classroom management at school. Parents should reward good behavior, provide constant discipline, and set limits. Tutoring should be available for the child as needed.  ADHD is a lifelong condition. If untreated, the disorder can have long-term serious effects into adolescence and adulthood.  HOME CARE INSTRUCTIONS   · Often with ADHD there is a lot of frustration among family members dealing with the condition. Blame   and anger are also feelings that are common. In many cases, because the problem affects the family as a whole, the entire family may need help. A therapist can help the family find better ways to handle the disruptive behaviors of the person with ADHD and promote change. If the person with ADHD is young, most of the therapist's work is with the parents. Parents will learn techniques for coping with and improving their child's behavior.  Sometimes only the child with the ADHD needs counseling. Your health care providers can help you make these decisions.  · Children with ADHD may need help learning how to organize. Some helpful tips include:  ¨ Keep routines the same every day from wake-up time to bedtime. Schedule all activities, including homework and playtime. Keep the schedule in a place where the person with ADHD will often see it. Mark schedule changes as far in advance as possible.  ¨ Schedule outdoor and indoor recreation.  ¨ Have a place for everything and keep everything in its place. This includes clothing, backpacks, and school supplies.  ¨ Encourage writing down assignments and bringing home needed books. Work with your child's teachers for assistance in organizing school work.  · Offer your child a well-balanced diet. Breakfast that includes a balance of whole grains, protein, and fruits or vegetables is especially important for school performance. Children should avoid drinks with caffeine including:  ¨ Soft drinks.  ¨ Coffee.  ¨ Tea.  ¨ However, some older children (adolescents) may find these drinks helpful in improving their attention. Because it can also be common for adolescents with ADHD to become addicted to caffeine, talk with your health care provider about what is a safe amount of caffeine intake for your child.  · Children with ADHD need consistent rules that they can understand and follow. If rules are followed, give small rewards. Children with ADHD often receive, and expect, criticism. Look for good behavior and praise it. Set realistic goals. Give clear instructions. Look for activities that can foster success and self-esteem. Make time for pleasant activities with your child. Give lots of affection.  · Parents are their children's greatest advocates. Learn as much as possible about ADHD. This helps you become a stronger and better advocate for your child. It also helps you educate your child's teachers and instructors  if they feel inadequate in these areas. Parent support groups are often helpful. A national group with local chapters is called Children and Adults with Attention Deficit Hyperactivity Disorder (CHADD).  SEEK MEDICAL CARE IF:  · Your child has repeated muscle twitches, cough, or speech outbursts.  · Your child has sleep problems.  · Your child has a marked loss of appetite.  · Your child develops depression.  · Your child has new or worsening behavioral problems.  · Your child develops dizziness.  · Your child has a racing heart.  · Your child has stomach pains.  · Your child develops headaches.  SEEK IMMEDIATE MEDICAL CARE IF:  · Your child has been diagnosed with depression or anxiety and the symptoms seem to be getting worse.  · Your child has been depressed and suddenly appears to have increased energy or motivation.  · You are worried that your child is having a bad reaction to a medication he or she is taking for ADHD.     This information is not intended to replace advice given to you by your health care provider. Make sure you discuss any questions you have with your   health care provider.     Document Released: 03/09/2002 Document Revised: 03/24/2013 Document Reviewed: 11/24/2012  Elsevier Interactive Patient Education ©2016 [employer]

## 2015-05-16 ENCOUNTER — Telehealth: Payer: Self-pay | Admitting: Genetic Counselor

## 2015-05-16 NOTE — Telephone Encounter (Signed)
Pt confirmed appt for genetic counseling for 3/7

## 2015-06-07 ENCOUNTER — Other Ambulatory Visit: Payer: Self-pay

## 2015-06-07 ENCOUNTER — Encounter: Payer: Self-pay | Admitting: Genetic Counselor

## 2015-06-30 ENCOUNTER — Encounter: Payer: Self-pay | Admitting: Genetic Counselor

## 2015-06-30 ENCOUNTER — Other Ambulatory Visit: Payer: Self-pay

## 2015-07-11 ENCOUNTER — Encounter: Payer: Self-pay | Admitting: Internal Medicine

## 2015-07-27 ENCOUNTER — Telehealth: Payer: Self-pay | Admitting: Genetic Counselor

## 2015-07-27 NOTE — Telephone Encounter (Signed)
Called Sara Brewer to discuss the reason for her referral to genetic counseling.  She reported her uncles history of sudden death due to cancer from EB virus cancer exposure; she also has a history of EB virus infection.  Discussed that we typically do not think of this as being a 'genetic' cause for cancer but seems to be more of an environmental factor that may somewhat increase risks for certain cancers like lymphoma.  Sara Brewer mentions that there is also some other family history of cancer and I would like to ask her questions about this history to determine if she really needs to visit genetic counseling, if testing of another family member would be more appropriate, etc. But she [place] and does not have time to discuss.  She reports that she had left a message to cancel her appt for tomorrow due to being out of state, so I will go ahead and do that for her now.  She will call back to reschedule later.

## 2015-07-28 ENCOUNTER — Encounter: Payer: Self-pay | Admitting: Genetic Counselor

## 2015-07-28 ENCOUNTER — Other Ambulatory Visit: Payer: Self-pay

## 2015-08-10 ENCOUNTER — Encounter: Payer: Self-pay | Admitting: Internal Medicine

## 2015-08-23 ENCOUNTER — Encounter: Payer: Self-pay | Admitting: Internal Medicine

## 2015-08-23 ENCOUNTER — Ambulatory Visit (INDEPENDENT_AMBULATORY_CARE_PROVIDER_SITE_OTHER): Payer: 59 | Admitting: Internal Medicine

## 2015-08-23 VITALS — BP 110/78 | HR 72 | Temp 98.2°F | Resp 18 | Ht 65.0 in | Wt 149.0 lb

## 2015-08-23 DIAGNOSIS — Z136 Encounter for screening for cardiovascular disorders: Secondary | ICD-10-CM | POA: Diagnosis not present

## 2015-08-23 DIAGNOSIS — Z13220 Encounter for screening for lipoid disorders: Secondary | ICD-10-CM

## 2015-08-23 DIAGNOSIS — Z0000 Encounter for general adult medical examination without abnormal findings: Secondary | ICD-10-CM | POA: Diagnosis not present

## 2015-08-23 DIAGNOSIS — Z131 Encounter for screening for diabetes mellitus: Secondary | ICD-10-CM

## 2015-08-23 DIAGNOSIS — I10 Essential (primary) hypertension: Secondary | ICD-10-CM

## 2015-08-23 DIAGNOSIS — E0500 Thyrotoxicosis with diffuse goiter without thyrotoxic crisis or storm: Secondary | ICD-10-CM

## 2015-08-23 DIAGNOSIS — Z1389 Encounter for screening for other disorder: Secondary | ICD-10-CM

## 2015-08-23 DIAGNOSIS — F988 Other specified behavioral and emotional disorders with onset usually occurring in childhood and adolescence: Secondary | ICD-10-CM

## 2015-08-23 DIAGNOSIS — E538 Deficiency of other specified B group vitamins: Secondary | ICD-10-CM

## 2015-08-23 DIAGNOSIS — E559 Vitamin D deficiency, unspecified: Secondary | ICD-10-CM

## 2015-08-23 LAB — IRON AND TIBC
%SAT: 22 % (ref 11–50)
IRON: 68 ug/dL (ref 40–190)
TIBC: 313 ug/dL (ref 250–450)
UIBC: 245 ug/dL (ref 125–400)

## 2015-08-23 LAB — CBC WITH DIFFERENTIAL/PLATELET
BASOS PCT: 0 %
Basophils Absolute: 0 cells/uL (ref 0–200)
EOS ABS: 69 {cells}/uL (ref 15–500)
Eosinophils Relative: 1 %
HCT: 37.9 % (ref 35.0–45.0)
Hemoglobin: 12.8 g/dL (ref 11.7–15.5)
LYMPHS ABS: 1794 {cells}/uL (ref 850–3900)
Lymphocytes Relative: 26 %
MCH: 30 pg (ref 27.0–33.0)
MCHC: 33.8 g/dL (ref 32.0–36.0)
MCV: 89 fL (ref 80.0–100.0)
MONO ABS: 552 {cells}/uL (ref 200–950)
MONOS PCT: 8 %
MPV: 11.2 fL (ref 7.5–12.5)
NEUTROS ABS: 4485 {cells}/uL (ref 1500–7800)
Neutrophils Relative %: 65 %
PLATELETS: 297 10*3/uL (ref 140–400)
RBC: 4.26 MIL/uL (ref 3.80–5.10)
RDW: 13 % (ref 11.0–15.0)
WBC: 6.9 10*3/uL (ref 3.8–10.8)

## 2015-08-23 LAB — HEPATIC FUNCTION PANEL
ALBUMIN: 4.7 g/dL (ref 3.6–5.1)
ALK PHOS: 71 U/L (ref 33–115)
ALT: 9 U/L (ref 6–29)
AST: 15 U/L (ref 10–30)
BILIRUBIN DIRECT: 0.1 mg/dL (ref <=0.2)
BILIRUBIN INDIRECT: 0.2 mg/dL (ref 0.2–1.2)
BILIRUBIN TOTAL: 0.3 mg/dL (ref 0.2–1.2)
Total Protein: 7.2 g/dL (ref 6.1–8.1)

## 2015-08-23 LAB — HEMOGLOBIN A1C
Hgb A1c MFr Bld: 5.1 % (ref <5.7)
Mean Plasma Glucose: 100 mg/dL

## 2015-08-23 LAB — BASIC METABOLIC PANEL WITH GFR
BUN: 9 mg/dL (ref 7–25)
CO2: 23 mmol/L (ref 20–31)
Calcium: 9.1 mg/dL (ref 8.6–10.2)
Chloride: 103 mmol/L (ref 98–110)
Creat: 0.7 mg/dL (ref 0.50–1.10)
GFR, Est Non African American: >89 mL/min (ref >=60)
Glucose, Bld: 83 mg/dL (ref 65–99)
POTASSIUM: 4.2 mmol/L (ref 3.5–5.3)
SODIUM: 135 mmol/L (ref 135–146)

## 2015-08-23 LAB — TSH: TSH: 0.29 mIU/L — ABNORMAL LOW

## 2015-08-23 LAB — LIPID PANEL
CHOL/HDL RATIO: 2.3 ratio (ref <=5.0)
Cholesterol: 142 mg/dL (ref 125–200)
HDL: 61 mg/dL (ref >=46)
LDL CALC: 71 mg/dL (ref <130)
TRIGLYCERIDES: 48 mg/dL (ref <150)
VLDL: 10 mg/dL (ref <30)

## 2015-08-23 LAB — VITAMIN B12: Vitamin B-12: 378 pg/mL (ref 200–1100)

## 2015-08-23 LAB — MAGNESIUM: Magnesium: 2.1 mg/dL (ref 1.5–2.5)

## 2015-08-23 MED ORDER — AMPHETAMINE-DEXTROAMPHETAMINE 30 MG PO TABS: 30.0000 mg | ORAL_TABLET | Freq: Every day | ORAL | Status: DC

## 2015-08-23 NOTE — Progress Notes (Signed)
Annual Screening Comprehensive Examination   This very nice 30 y.o.female presents for complete physical.  Patient has a history of ADD, graves disease, and abnormal pap smear with LEEP procedure.  Patient reports no complaints at this time.   She reports that she is enjoying her work.  She reports that she has trouble with changes in ear pain with her taking off and landing.  She reports that she cannot take any sinus or allergy medications as it is against the regulations for her job.  She is unsure whether she can get a prescription to take them or not.  She is not interested in using the nasal sprays either.   She is currently on Adderall daily for work.  She does work as a Financial controller .  She does take medication daily as "I get fat if I don't take it".  She does not take breaks.  She reports that she will take it daily.    Last pap smear was in 09/2014.    Finally, patient has history of Vitamin D Deficiency and last vitamin D was  Lab Results  Component Value Date   VD25OH 18* 07/01/2014  .  Currently on supplementation     Current Outpatient Prescriptions on File Prior to Visit  Medication Sig Dispense Refill  . amphetamine-dextroamphetamine (ADDERALL) 30 MG tablet Take 1 tablet by mouth daily. 30 tablet 0  . Multiple Vitamins-Minerals (MULTIVITAMIN WITH MINERALS) tablet Take 1 tablet by mouth daily.      . norethindrone (MICRONOR,CAMILA,ERRIN) 0.35 MG tablet Take 1 tablet (0.35 mg total) by mouth daily. 1 Package 11   No current facility-administered medications on file prior to visit.    Allergies  Allergen Reactions  . Codeine Nausea And Vomiting  . Milk-Related Compounds Hives and Nausea And Vomiting  . Sulfonamide Derivatives Nausea And Vomiting  . Penicillins Nausea And Vomiting    PER PATIENT just nausea and vomiting    Past Medical History  Diagnosis Date  . Back pain   . ADD (attention deficit disorder with hyperactivity)   . Pelvic pain in female   .  Graves disease 2014    Dr. Evlyn Kanner   . GERD (gastroesophageal reflux disease)   . Anemia     iron  . Anxiety   . Vitamin B12 deficiency   . Vitamin D deficiency     Immunization History  Administered Date(s) Administered  . HPV Quadrivalent 09/09/2001, 06/14/2005, 10/16/2005  . Hepatitis B 04/03/1999  . Meningococcal Polysaccharide 04/03/1999  . Td 10/26/2004  . Tdap 09/13/2014    Past Surgical History  Procedure Laterality Date  . Wisdom tooth extraction    . Gum grafts    . Laparoscopy  02/19/2011    Procedure: LAPAROSCOPY DIAGNOSTIC;  Surgeon: Lum Keas;  Location: [facility];  Service: Gynecology;  Laterality: N/A;  Diagnostic Laparoscopy    Family History  Problem Relation Age of Onset  . Heart murmur Brother     unsure of extend but will require surgery  . Heart disease Brother     valve  . Stroke Mother 67    mini  . Cancer Father     melanoma  . Cancer Maternal Uncle     melanoma  . Cancer Maternal Grandmother 51    breast  . Heart disease Maternal Grandmother   . Heart disease Maternal Grandfather   . Cancer Paternal Grandmother     lung/ liver/ bone  . Heart disease Paternal Grandmother   .  Cancer Paternal Grandfather     melanoma  . Heart disease Paternal Grandfather   . Hyperlipidemia Paternal Grandfather   . Hypertension Paternal Grandfather     Social History   Social History  . Marital Status: Single    Spouse Name: N/A  . Number of Children: N/A  . Years of Education: N/A   Occupational History  . Not on file.   Social History Main Topics  . Smoking status: Never Smoker   . Smokeless tobacco: Never Used  . Alcohol Use: No  . Drug Use: No  . Sexual Activity: Yes    Birth Control/ Protection: Implant   Other Topics Concern  . Not on file   Social History Narrative   Review of Systems  Constitutional: Negative for fever, chills and malaise/fatigue.  HENT: Positive for ear pain. Negative for congestion and sore throat.    Eyes: Negative.   Respiratory: Negative for cough, shortness of breath and wheezing.   Cardiovascular: Negative for chest pain, palpitations and leg swelling.  Gastrointestinal: Negative for heartburn, abdominal pain, diarrhea, constipation, blood in stool and melena.  Genitourinary: Negative.   Skin: Negative.   Neurological: Negative for dizziness, loss of consciousness and headaches.  Psychiatric/Behavioral: Negative for depression. The patient is not nervous/anxious and does not have insomnia.       Physical Exam  Ht 5\' 5"  (1.651 m)  Wt 149 lb (67.586 kg)  BMI 24.79 kg/m2  LMP 08/12/2015  General Appearance: Well nourished and in no apparent distress. Eyes: PERRLA, EOMs, conjunctiva no swelling or erythema, normal fundi and vessels. Sinuses: No frontal/maxillary tenderness ENT/Mouth: EACs patent / TMs  nl. Nares clear without erythema, swelling, mucoid exudates. Oral hygiene is good. No erythema, swelling, or exudate. Tongue normal, non-obstructing. Tonsils not swollen or erythematous. Hearing normal.  Neck: Supple, thyroid normal. No bruits, nodes or JVD. Respiratory: Respiratory effort normal.  BS equal and clear bilateral without rales, rhonci, wheezing or stridor. Cardio: Heart sounds are normal with regular rate and rhythm and no murmurs, rubs or gallops. Peripheral pulses are normal and equal bilaterally without edema. No aortic or femoral bruits. Chest: symmetric with normal excursions and percussion. Breasts: Symmetric, without lumps, nipple discharge, retractions, or fibrocystic changes.  Abdomen: Flat, soft, with bowl sounds. Nontender, no guarding, rebound, hernias, masses, or organomegaly.  Lymphatics: Non tender without lymphadenopathy.  Musculoskeletal: Full ROM all peripheral extremities, joint stability, 5/5 strength, and normal gait. Skin: Warm and dry without rashes, lesions, cyanosis, clubbing or  ecchymosis.  Neuro: Cranial nerves intact, reflexes equal  bilaterally. Normal muscle tone, no cerebellar symptoms. Sensation intact.  Pysch: Awake and oriented X 3, normal affect, Insight and Judgment appropriate.   Assessment and Plan   1. Routine general medical examination at a health care facility  - CBC with Differential/Platelet - BASIC METABOLIC PANEL WITH GFR - Hepatic function panel - Magnesium  2. Screening for hyperlipidemia  - Lipid panel  3. Screening for diabetes mellitus  - Hemoglobin A1c - Insulin, random  4. Graves disease  - TSH  5. Vitamin B12 deficiency  - Iron and TIBC - Vitamin B12  6. Screening for hematuria or proteinuria  - Urinalysis, Routine w reflex microscopic (not at [facility]) - Microalbumin / creatinine urine ratio  7. Screening for cardiovascular condition  - EKG 12-Lead  8. Vitamin D deficiency  - VITAMIN D 25 Hydroxy (Vit-D Deficiency, Fractures)  9. ADD (attention deficit disorder) -discussed taking a break from medication.   -patient was very rude  and adament about taking daily.  If signs of abuse or improper use will write for 5 days a week - amphetamine-dextroamphetamine (ADDERALL) 30 MG tablet; Take 1 tablet by mouth daily.  Dispense: 30 tablet; Refill: 0       Continue prudent diet as discussed, weight control, regular exercise, and medications. Routine screening labs and tests as requested with regular follow-up as recommended.  Over 40 minutes of exam, counseling, chart review and critical decision making was performed

## 2015-08-24 LAB — URINALYSIS, ROUTINE W REFLEX MICROSCOPIC
Bilirubin Urine: NEGATIVE
Glucose, UA: NEGATIVE
Hgb urine dipstick: NEGATIVE
Ketones, ur: NEGATIVE
LEUKOCYTES UA: NEGATIVE
NITRITE: NEGATIVE
PH: 6 (ref 5.0–8.0)
Protein, ur: NEGATIVE
SPECIFIC GRAVITY, URINE: 1.006 (ref 1.001–1.035)

## 2015-08-24 LAB — MICROALBUMIN / CREATININE URINE RATIO
CREATININE, URINE: 39 mg/dL (ref 20–320)
MICROALB UR: 0.5 mg/dL
MICROALB/CREAT RATIO: 13 ug/mg{creat} (ref <30)

## 2015-08-24 LAB — VITAMIN D 25 HYDROXY (VIT D DEFICIENCY, FRACTURES): Vit D, 25-Hydroxy: 19 ng/mL — ABNORMAL LOW (ref 30–100)

## 2015-08-24 LAB — INSULIN, RANDOM: Insulin: 7.3 u[IU]/mL (ref 2.0–19.6)

## 2015-10-24 ENCOUNTER — Encounter: Payer: Self-pay | Admitting: Internal Medicine

## 2015-10-24 ENCOUNTER — Ambulatory Visit (INDEPENDENT_AMBULATORY_CARE_PROVIDER_SITE_OTHER): Payer: 59 | Admitting: Internal Medicine

## 2015-10-24 VITALS — BP 110/70 | HR 98 | Temp 98.0°F | Resp 18 | Ht 65.0 in

## 2015-10-24 DIAGNOSIS — J069 Acute upper respiratory infection, unspecified: Secondary | ICD-10-CM | POA: Diagnosis not present

## 2015-10-24 MED ORDER — PREDNISONE 20 MG PO TABS: ORAL_TABLET | ORAL | 0 refills | Status: DC

## 2015-10-24 MED ORDER — PSEUDOEPH-BROMPHEN-DM 30-2-10 MG/5ML PO SYRP: 5.0000 mL | ORAL_SOLUTION | Freq: Three times a day (TID) | ORAL | 0 refills | Status: DC | PRN

## 2015-10-24 MED ORDER — AZITHROMYCIN 250 MG PO TABS: ORAL_TABLET | ORAL | 0 refills | Status: DC

## 2015-10-24 NOTE — Progress Notes (Signed)
Patient ID: Sara Brewer, female   DOB: 08-12-1985, 30 y.o.   MRN: [id number]  HPI  Patient presents to the office for evaluation of cough.  It has been going on for 1 weeks.  Patient reports night > day, wet, worse with lying down.  They also endorse postnasal drip and nasal congestion, clear rhinorrhea, nasal congestion, sore dry throat, ear pressure.  .  They have tried tylenol, mucinex, and daily antihistamine.  They report that nothing has worked.  They admits to other sick contacts.  Review of Systems  Constitutional: Positive for malaise/fatigue. Negative for chills and fever.  HENT: Positive for congestion, ear pain, hearing loss and sore throat.   Respiratory: Positive for cough. Negative for sputum production, shortness of breath and wheezing.   Cardiovascular: Negative for chest pain, palpitations and leg swelling.  Neurological: Positive for headaches.    PE:  There were no vitals filed for this visit.  General:  Alert and non-toxic, WDWN, NAD HEENT: NCAT, PERLA, EOM normal, no occular discharge or erythema.  Nasal mucosal edema with sinus tenderness to palpation.  Oropharynx clear with minimal oropharyngeal edema and erythema.  Mucous membranes moist and pink. Neck:  Cervical adenopathy Chest:  RRR no MRGs.  Lungs clear to auscultation A&P with no wheezes rhonchi or rales.   Abdomen: +BS x 4 quadrants, soft, non-tender, no guarding, rigidity, or rebound. Skin: warm and dry no rash Neuro: A&Ox4, CN II-XII grossly intact  Assessment and Plan:   1. Acute URI -cont antihistamine - azithromycin (ZITHROMAX Z-PAK) 250 MG tablet; 2 po day one, then 1 daily x 4 days  Dispense: 6 tablet; Refill: 0 - predniSONE (DELTASONE) 20 MG tablet; 3 tabs po day one, then 2 tabs daily x 4 days  Dispense: 11 tablet; Refill: 0 - brompheniramine-pseudoephedrine-DM 30-2-10 MG/5ML syrup; Take 5 mLs by mouth 3 (three) times daily as needed.  Dispense: 120 mL; Refill: 0

## 2015-12-26 ENCOUNTER — Ambulatory Visit: Payer: Self-pay | Admitting: Physician Assistant

## 2015-12-26 NOTE — Progress Notes (Deleted)
Assessment and Plan:    Vitamin D Def - check level and continue medications.   Continue diet and meds as discussed. Further disposition pending results of labs. Over 30 minutes of exam, counseling, chart review, and critical decision making was performed  Future Appointments Date Time Provider Department Center  12/26/2015 11:30 AM Quentin Mulling, PA-C [facility] None  08/23/2016 3:00 PM Courtney Forcucci, PA-C [facility] None     HPI 30 y.o. female  presents for 3 month follow up on ADD, anemia, vitamin D def  Her blood pressure {HAS HAS NOT:18834} been controlled at home, today their BP is    She {DOES_DOES ZHY:86578} workout. She denies chest pain, shortness of breath, dizziness.  Her blood pressure {HAS HAS NOT:18834} been controlled at home, today their BP is    She {DOES_DOES ION:62952} workout. She denies chest pain, shortness of breath, dizziness. Patient is on an ADD medication, she states that the medication is helping and she denies any adverse reactions.  Patient is on Vitamin D supplement.   Lab Results  Component Value Date   VD25OH 19 (L) 08/23/2015       Current Medications:  Current Outpatient Prescriptions on File Prior to Visit  Medication Sig Dispense Refill  . amphetamine-dextroamphetamine (ADDERALL) 30 MG tablet Take 1 tablet by mouth daily. 30 tablet 0  . azithromycin (ZITHROMAX Z-PAK) 250 MG tablet 2 po day one, then 1 daily x 4 days 6 tablet 0  . brompheniramine-pseudoephedrine-DM 30-2-10 MG/5ML syrup Take 5 mLs by mouth 3 (three) times daily as needed. 120 mL 0  . Multiple Vitamins-Minerals (MULTIVITAMIN WITH MINERALS) tablet Take 1 tablet by mouth daily.      . norethindrone (MICRONOR,CAMILA,ERRIN) 0.35 MG tablet Take 1 tablet (0.35 mg total) by mouth daily. 1 Package 11  . predniSONE (DELTASONE) 20 MG tablet 3 tabs po day one, then 2 tabs daily x 4 days 11 tablet 0   No current facility-administered medications on file prior to visit.    Medical  History:  Past Medical History:  Diagnosis Date  . ADD (attention deficit disorder with hyperactivity)   . Anemia    iron  . Anxiety   . Back pain   . GERD (gastroesophageal reflux disease)   . Graves disease 2014   Dr. Evlyn Kanner   . Pelvic pain in female   . Vitamin B12 deficiency   . Vitamin D deficiency    Allergies:  Allergies  Allergen Reactions  . Codeine Nausea And Vomiting  . Milk-Related Compounds Hives and Nausea And Vomiting  . Sulfonamide Derivatives Nausea And Vomiting  . Penicillins Nausea And Vomiting    PER PATIENT just nausea and vomiting     Review of Systems:  ROS  Family history- Review and unchanged Social history- Review and unchanged Physical Exam: There were no vitals taken for this visit. Wt Readings from Last 3 Encounters:  08/23/15 149 lb (67.6 kg)  04/28/15 143 lb 9.6 oz (65.1 kg)  09/13/14 140 lb (63.5 kg)   General Appearance: Well nourished, in no apparent distress. Eyes: PERRLA, EOMs, conjunctiva no swelling or erythema Sinuses: No Frontal/maxillary tenderness ENT/Mouth: Ext aud canals clear, TMs without erythema, bulging. No erythema, swelling, or exudate on post pharynx.  Tonsils not swollen or erythematous. Hearing normal.  Neck: Supple, thyroid normal.  Respiratory: Respiratory effort normal, BS equal bilaterally without rales, rhonchi, wheezing or stridor.  Cardio: RRR with no MRGs. Brisk peripheral pulses without edema.  Abdomen: Soft, + BS,  Non tender, no  guarding, rebound, hernias, masses. Lymphatics: Non tender without lymphadenopathy.  Musculoskeletal: Full ROM, 5/5 strength, {PSY - GAIT AND STATION:22860} gait Skin: Warm, dry without rashes, lesions, ecchymosis.  Neuro: Cranial nerves intact. Normal muscle tone, no cerebellar symptoms. Psych: Awake and oriented X 3, normal affect, Insight and Judgment appropriate.    Quentin MullingAmanda Collier, PA-C 10:36 AM [facility]

## 2016-01-03 ENCOUNTER — Other Ambulatory Visit: Payer: Self-pay | Admitting: Physician Assistant

## 2016-01-03 DIAGNOSIS — F988 Other specified behavioral and emotional disorders with onset usually occurring in childhood and adolescence: Secondary | ICD-10-CM

## 2016-01-03 MED ORDER — AMPHETAMINE-DEXTROAMPHETAMINE 30 MG PO TABS: 30.0000 mg | ORAL_TABLET | Freq: Every day | ORAL | 0 refills | Status: DC

## 2016-03-30 ENCOUNTER — Telehealth: Payer: Self-pay | Admitting: Physician Assistant

## 2016-03-30 MED ORDER — PREDNISONE 20 MG PO TABS: ORAL_TABLET | ORAL | 0 refills | Status: DC

## 2016-03-30 MED ORDER — FLUTICASONE PROPIONATE 50 MCG/ACT NA SUSP: 2.0000 | Freq: Every day | NASAL | 1 refills | Status: DC

## 2016-03-30 MED ORDER — AZITHROMYCIN 250 MG PO TABS: ORAL_TABLET | ORAL | 1 refills | Status: AC

## 2016-03-30 MED ORDER — PROMETHAZINE-DM 6.25-15 MG/5ML PO SYRP: 5.0000 mL | ORAL_SOLUTION | Freq: Four times a day (QID) | ORAL | 1 refills | Status: DC | PRN

## 2016-03-30 NOTE — Telephone Encounter (Signed)
Patient also complains  of possible sinusitis. Symptoms include sinus drainage, pressure, sneezine, ear pressure/pain x 3 days. She has been using dayquil.  Will send in prednisone, get on flonase, allergy pill, and if not better follow up Tuesday in the office.

## 2016-03-30 NOTE — Telephone Encounter (Signed)
Informed pt of her not needing an ABX & pt states [place] out [place] the [place] & would like an ABX due to not being able to get medical care if she needs it when she leaves. Pt states she will just go to urgent care & get an ABX

## 2016-03-30 NOTE — Telephone Encounter (Signed)
Informed pt of ABX that was sent in & also told pt to wait & take the ABX if she does not get better with the prednisone. Pt agreed & hung up.

## 2016-03-30 NOTE — Telephone Encounter (Signed)
Informed pt of  prednisone, get on flonase, allergy pill, and if not better follow up Tuesday in the office.   Pt stated that she feels like she needs a ABX & would like to get a Rx for that because she does not want to go to the Urgent Care. Please advise?

## 2016-06-18 ENCOUNTER — Ambulatory Visit: Payer: Self-pay | Admitting: Internal Medicine

## 2016-06-18 ENCOUNTER — Other Ambulatory Visit: Payer: Self-pay | Admitting: Internal Medicine

## 2016-06-18 DIAGNOSIS — F988 Other specified behavioral and emotional disorders with onset usually occurring in childhood and adolescence: Secondary | ICD-10-CM

## 2016-06-18 MED ORDER — AMPHETAMINE-DEXTROAMPHETAMINE 30 MG PO TABS: 30.0000 mg | ORAL_TABLET | Freq: Every day | ORAL | 0 refills | Status: DC

## 2016-07-10 ENCOUNTER — Encounter: Payer: Self-pay | Admitting: Internal Medicine

## 2016-07-11 ENCOUNTER — Encounter: Payer: Self-pay | Admitting: Physician Assistant

## 2016-07-11 ENCOUNTER — Ambulatory Visit (INDEPENDENT_AMBULATORY_CARE_PROVIDER_SITE_OTHER): Payer: 59 | Admitting: Physician Assistant

## 2016-07-11 ENCOUNTER — Other Ambulatory Visit (HOSPITAL_COMMUNITY)
Admission: RE | Admit: 2016-07-11 | Discharge: 2016-07-11 | Disposition: A | Discharge status: Home / Self Care | Payer: 59 | Source: Ambulatory Visit | Attending: Physician Assistant | Admitting: Physician Assistant

## 2016-07-11 VITALS — BP 118/76 | HR 62 | Temp 98.2°F | Resp 18 | Ht 65.0 in | Wt 148.0 lb

## 2016-07-11 DIAGNOSIS — F988 Other specified behavioral and emotional disorders with onset usually occurring in childhood and adolescence: Secondary | ICD-10-CM | POA: Diagnosis not present

## 2016-07-11 DIAGNOSIS — E0500 Thyrotoxicosis with diffuse goiter without thyrotoxic crisis or storm: Secondary | ICD-10-CM

## 2016-07-11 DIAGNOSIS — Z30011 Encounter for initial prescription of contraceptive pills: Secondary | ICD-10-CM

## 2016-07-11 DIAGNOSIS — Z113 Encounter for screening for infections with a predominantly sexual mode of transmission: Secondary | ICD-10-CM | POA: Diagnosis present

## 2016-07-11 DIAGNOSIS — Z79899 Other long term (current) drug therapy: Secondary | ICD-10-CM | POA: Insufficient documentation

## 2016-07-11 DIAGNOSIS — Z124 Encounter for screening for malignant neoplasm of cervix: Secondary | ICD-10-CM | POA: Diagnosis present

## 2016-07-11 DIAGNOSIS — E559 Vitamin D deficiency, unspecified: Secondary | ICD-10-CM | POA: Diagnosis not present

## 2016-07-11 LAB — BASIC METABOLIC PANEL WITH GFR
BUN: 14 mg/dL (ref 7–25)
CO2: 24 mmol/L (ref 20–31)
CREATININE: 0.84 mg/dL (ref 0.50–1.10)
Calcium: 9 mg/dL (ref 8.6–10.2)
Chloride: 107 mmol/L (ref 98–110)
GFR, Est African American: >89 mL/min (ref >=60)
GFR, Est Non African American: >89 mL/min (ref >=60)
GLUCOSE: 80 mg/dL (ref 65–99)
Potassium: 4.3 mmol/L (ref 3.5–5.3)
Sodium: 140 mmol/L (ref 135–146)

## 2016-07-11 LAB — CBC WITH DIFFERENTIAL/PLATELET
BASOS ABS: 58 {cells}/uL (ref 0–200)
Basophils Relative: 1 %
EOS ABS: 116 {cells}/uL (ref 15–500)
EOS PCT: 2 %
HEMATOCRIT: 40.5 % (ref 35.0–45.0)
HEMOGLOBIN: 13.3 g/dL (ref 11.7–15.5)
Lymphocytes Relative: 29 %
Lymphs Abs: 1682 cells/uL (ref 850–3900)
MCH: 30.2 pg (ref 27.0–33.0)
MCHC: 32.8 g/dL (ref 32.0–36.0)
MCV: 91.8 fL (ref 80.0–100.0)
MPV: 11 fL (ref 7.5–12.5)
Monocytes Absolute: 406 cells/uL (ref 200–950)
Monocytes Relative: 7 %
NEUTROS PCT: 61 %
Neutro Abs: 3538 cells/uL (ref 1500–7800)
Platelets: 309 10*3/uL (ref 140–400)
RBC: 4.41 MIL/uL (ref 3.80–5.10)
RDW: 13.2 % (ref 11.0–15.0)
WBC: 5.8 10*3/uL (ref 3.8–10.8)

## 2016-07-11 LAB — HEPATIC FUNCTION PANEL
ALBUMIN: 4.4 g/dL (ref 3.6–5.1)
ALT: 10 U/L (ref 6–29)
AST: 13 U/L (ref 10–30)
Alkaline Phosphatase: 70 U/L (ref 33–115)
BILIRUBIN TOTAL: 0.4 mg/dL (ref 0.2–1.2)
Bilirubin, Direct: 0.1 mg/dL (ref <=0.2)
Indirect Bilirubin: 0.3 mg/dL (ref 0.2–1.2)
Total Protein: 7 g/dL (ref 6.1–8.1)

## 2016-07-11 LAB — TSH: TSH: 0.56 mIU/L

## 2016-07-11 MED ORDER — AMPHETAMINE-DEXTROAMPHETAMINE 30 MG PO TABS: 30.0000 mg | ORAL_TABLET | Freq: Every day | ORAL | 0 refills | Status: DC

## 2016-07-11 MED ORDER — ETONOGESTREL-ETHINYL ESTRADIOL 0.12-0.015 MG/24HR VA RING: VAGINAL_RING | VAGINAL | 1 refills | Status: DC

## 2016-07-11 MED ORDER — NORETHINDRONE 0.35 MG PO TABS: 1.0000 | ORAL_TABLET | Freq: Every day | ORAL | 3 refills | Status: DC

## 2016-07-11 NOTE — Progress Notes (Signed)
Assessment and Plan:   Screening for cervical cancer -     Cytology - PAP  Graves disease -     TSH  Attention deficit disorder, unspecified hyperactivity presence -     amphetamine-dextroamphetamine (ADDERALL) 30 MG tablet; Take 1 tablet by mouth daily.  OCP (oral contraceptive pills) initiation -     norethindrone (MICRONOR,CAMILA,ERRIN) 0.35 MG tablet; Take 1 tablet (0.35 mg total) by mouth daily. -     etonogestrel-ethinyl estradiol (NUVARING) 0.12-0.015 MG/24HR vaginal ring; Insert vaginally and leave in place for 3 consecutive weeks, then remove for 1 week.  Vitamin D deficiency -     VITAMIN D 25 Hydroxy (Vit-D Deficiency, Fractures)  Medication management -     CBC with Differential/Platelet -     BASIC METABOLIC PANEL WITH GFR -     Hepatic function panel -     Magnesium  Screen for STD (sexually transmitted disease) -     HIV antibody -     RPR -     GC/Chlamydia Probe Amp -     HSV(herpes simplex vrs) 1+2 ab-IgG  Discussed need to keep appointments, follow up 6 months for CPE No future appointments.   HPI 31 y.o. female  presents for 3 month follow up on hypertension, cholesterol, prediabetes, and vitamin D deficiency.   Her blood pressure has been controlled at home, today their BP is BP: 118/76   She does not workout. She denies chest pain, shortness of breath, dizziness. She was on BCP, but no longer on it due to being over due.  Patient is on an ADD medication, she states that the medication is helping and she denies any adverse reactions.   Immunization History  Administered Date(s) Administered  . HPV Quadrivalent 09/09/2001, 06/14/2005, 10/16/2005  . Hepatitis B 04/03/1999  . Meningococcal Polysaccharide 04/03/1999  . Td 10/26/2004  . Tdap 09/13/2014   Patient is on Vitamin D supplement.   Lab Results  Component Value Date   VD25OH 19 (L) 08/23/2015      Current Medications:  No current outpatient prescriptions on file prior to visit.     No current facility-administered medications on file prior to visit.     Medical History:  Past Medical History:  Diagnosis Date  . ADD (attention deficit disorder with hyperactivity)   . Anemia    iron  . Anxiety   . Back pain   . GERD (gastroesophageal reflux disease)   . Graves disease 2014   Dr. Evlyn Kanner   . Pelvic pain in female   . Vitamin B12 deficiency   . Vitamin D deficiency    Allergies:  Allergies  Allergen Reactions  . Codeine Nausea And Vomiting  . Milk-Related Compounds Hives and Nausea And Vomiting  . Sulfonamide Derivatives Nausea And Vomiting  . Penicillins Nausea And Vomiting    PER PATIENT just nausea and vomiting     Review of Systems:  Review of Systems  Constitutional: Negative.   HENT: Negative.   Eyes: Negative.   Respiratory: Negative.   Cardiovascular: Negative.   Gastrointestinal: Negative.   Genitourinary: Negative.   Musculoskeletal: Negative.   Skin: Negative.   Neurological: Negative.   Endo/Heme/Allergies: Negative.   Psychiatric/Behavioral: Negative.     Family history- Review and unchanged Social history- Review and unchanged Physical Exam: BP 118/76   Pulse 62   Temp 98.2 F (36.8 C) (Temporal)   Resp 18   Ht  (1.651 m)   Wt 148 lb (  67.1 kg)   LMP 06/27/2016   BMI 24.63 kg/m  Wt Readings from Last 3 Encounters:  07/11/16 148 lb (67.1 kg)  08/23/15 149 lb (67.6 kg)  04/28/15 143 lb 9.6 oz (65.1 kg)   General Appearance: Well nourished, in no apparent distress. Eyes: PERRLA, EOMs, conjunctiva no swelling or erythema Sinuses: No Frontal/maxillary tenderness ENT/Mouth: Ext aud canals clear, TMs without erythema, bulging. No erythema, swelling, or exudate on post pharynx.  Tonsils not swollen or erythematous. Hearing normal.  Neck: Supple, thyroid normal.  Respiratory: Respiratory effort normal, BS equal bilaterally without rales, rhonchi, wheezing or stridor.  Cardio: RRR with no MRGs. Brisk peripheral pulses  without edema.  Abdomen: Soft, + BS,  Non tender, no guarding, rebound, hernias, masses. Lymphatics: Non tender without lymphadenopathy.  Musculoskeletal: Full ROM, 5/5 strength, Normal gait Skin: Warm, dry without rashes, lesions, ecchymosis.  Neuro: Cranial nerves intact. Normal muscle tone, no cerebellar symptoms. Psych: Awake and oriented X 3, normal affect, Insight and Judgment appropriate.    Quentin Mulling, PA-C 10:04 AM [facility]

## 2016-07-11 NOTE — Patient Instructions (Signed)
Ethinyl Estradiol; Etonogestrel vaginal ring What is this medicine? ETHINYL ESTRADIOL; ETONOGESTREL (ETH in il es tra DYE ole; et oh noe JES trel) vaginal ring is a flexible, vaginal ring used as a contraceptive (birth control method). This medicine combines two types of female hormones, an estrogen and a progestin. This ring is used to prevent ovulation and pregnancy. Each ring is effective for one month. This medicine may be used for other purposes; ask your health care provider or pharmacist if you have questions. COMMON BRAND NAME(S): NuvaRing What should I tell my health care provider before I take this medicine? They need to know if you have or ever had any of these conditions: -abnormal vaginal bleeding -blood vessel disease or blood clots -breast, cervical, endometrial, ovarian, liver, or uterine cancer -diabetes -gallbladder disease -heart disease or recent heart attack -high blood pressure -high cholesterol -kidney disease -liver disease -migraine headaches -stroke -systemic lupus erythematosus (SLE) -tobacco smoker -an unusual or allergic reaction to estrogens, progestins, other medicines, foods, dyes, or preservatives -pregnant or trying to get pregnant -breast-feeding How should I use this medicine? Insert the ring into your vagina as directed. Follow the directions on the prescription label. The ring will remain place for 3 weeks and is then removed for a 1-week break. A new ring is inserted 1 week after the last ring was removed, on the same day of the week. Check often to make sure the ring is still in place, especially before and after sexual intercourse. If the ring was out of the vagina for an unknown amount of time, you may not be protected from pregnancy. Perform a pregnancy test and call your doctor. Do not use more often than directed. A patient package insert for the product will be given with each prescription and refill. Read this sheet carefully each time. The  sheet may change frequently. Contact your pediatrician regarding the use of this medicine in children. Special care may be needed. This medicine has been used in female children who have started having menstrual periods. Overdosage: If you think you have taken too much of this medicine contact a poison control center or emergency room at once. NOTE: This medicine is only for you. Do not share this medicine with others. What if I miss a dose? You will need to replace your vaginal ring once a month as directed. If the ring should slip out, or if you leave it in longer or shorter than you should, contact your health care professional for advice. What may interact with this medicine? Do not take this medicine with the following medication: -dasabuvir; ombitasvir; paritaprevir; ritonavir -ombitasvir; paritaprevir; ritonavir This medicine may also interact with the following medications: -acetaminophen -antibiotics or medicines for infections, especially rifampin, rifabutin, rifapentine, and griseofulvin, and possibly penicillins or tetracyclines -aprepitant -ascorbic acid (vitamin C) -atorvastatin -barbiturate medicines, such as phenobarbital -bosentan -carbamazepine -caffeine -clofibrate -cyclosporine -dantrolene -doxercalciferol -felbamate -grapefruit juice -hydrocortisone -medicines for anxiety or sleeping problems, such as diazepam or temazepam -medicines for diabetes, including pioglitazone -modafinil -mycophenolate -nefazodone -oxcarbazepine -phenytoin -prednisolone -ritonavir or other medicines for HIV infection or AIDS -rosuvastatin -selegiline -soy isoflavones supplements -St. John's wort -tamoxifen or raloxifene -theophylline -thyroid hormones -topiramate -warfarin This list may not describe all possible interactions. Give your health care provider a list of all the medicines, herbs, non-prescription drugs, or dietary supplements you use. Also tell them if you smoke,  drink alcohol, or use illegal drugs. Some items may interact with your medicine. What should I watch for while using   this medicine? Visit your doctor or health care professional for regular checks on your progress. You will need a regular breast and pelvic exam and Pap smear while on this medicine. Use an additional method of contraception during the first cycle that you use this ring. Do not use a diaphragm or female condom, as the ring can interfere with these birth control methods and their proper placement. If you have any reason to think you are pregnant, stop using this medicine right away and contact your doctor or health care professional. If you are using this medicine for hormone related problems, it may take several cycles of use to see improvement in your condition. Smoking increases the risk of getting a blood clot or having a stroke while you are using hormonal birth control, especially if you are more than 31 years old. You are strongly advised not to smoke. This medicine can make your body retain fluid, making your fingers, hands, or ankles swell. Your blood pressure can go up. Contact your doctor or health care professional if you feel you are retaining fluid. This medicine can make you more sensitive to the sun. Keep out of the sun. If you cannot avoid being in the sun, wear protective clothing and use sunscreen. Do not use sun lamps or tanning beds/booths. If you wear contact lenses and notice visual changes, or if the lenses begin to feel uncomfortable, consult your eye care specialist. In some women, tenderness, swelling, or minor bleeding of the gums may occur. Notify your dentist if this happens. Brushing and flossing your teeth regularly may help limit this. See your dentist regularly and inform your dentist of the medicines you are taking. If you are going to have elective surgery, you may need to stop using this medicine before the surgery. Consult your health care professional  for advice. This medicine does not protect you against HIV infection (AIDS) or any other sexually transmitted diseases. What side effects may I notice from receiving this medicine? Side effects that you should report to your doctor or health care professional as soon as possible: -breast tissue changes or discharge -changes in vaginal bleeding during your period or between your periods -chest pain -coughing up blood -dizziness or fainting spells -headaches or migraines -leg, arm or groin pain -severe or sudden headaches -stomach pain (severe) -sudden shortness of breath -sudden loss of coordination, especially on one side of the body -speech problems -symptoms of vaginal infection like itching, irritation or unusual discharge -tenderness in the upper abdomen -vomiting -weakness or numbness in the arms or legs, especially on one side of the body -yellowing of the eyes or skin Side effects that usually do not require medical attention (report to your doctor or health care professional if they continue or are bothersome): -breakthrough bleeding and spotting that continues beyond the 3 initial cycles of pills -breast tenderness -mood changes, anxiety, depression, frustration, anger, or emotional outbursts -increased sensitivity to sun or ultraviolet light -nausea -skin rash, acne, or brown spots on the skin -weight gain (slight) This list may not describe all possible side effects. Call your doctor for medical advice about side effects. You may report side effects to FDA at 1-800-FDA-1088. Where should I keep my medicine? Keep out of the reach of children. Store at room temperature between 15 and 30 degrees C (59 and 86 degrees F) for up to 4 months. The product will expire after 4 months. Protect from light. Throw away any unused medicine after the expiration date. NOTE: This   sheet is a summary. It may not cover all possible information. If you have questions about this medicine, talk  to your doctor, pharmacist, or health care provider.  2018 Elsevier/Gold Standard (2015-11-25 17:00:31)  

## 2016-07-12 LAB — RPR

## 2016-07-12 LAB — HSV(HERPES SIMPLEX VRS) I + II AB-IGG
HSV 1 Glycoprotein G Ab, IgG: 32.2 Index — ABNORMAL HIGH (ref <0.90)
HSV 2 Glycoprotein G Ab, IgG: <0.9 Index (ref <0.90)

## 2016-07-12 LAB — GC/CHLAMYDIA PROBE AMP
CT PROBE, AMP APTIMA: NOT DETECTED
GC Probe RNA: NOT DETECTED

## 2016-07-12 LAB — VITAMIN D 25 HYDROXY (VIT D DEFICIENCY, FRACTURES): Vit D, 25-Hydroxy: 14 ng/mL — ABNORMAL LOW (ref 30–100)

## 2016-07-12 LAB — HIV ANTIBODY (ROUTINE TESTING W REFLEX): HIV 1&2 Ab, 4th Generation: NONREACTIVE

## 2016-07-12 LAB — MAGNESIUM: MAGNESIUM: 2.1 mg/dL (ref 1.5–2.5)

## 2016-07-12 NOTE — Progress Notes (Signed)
Pt would like a med sent to her pharmacy for cold sore. Pt aware of lab results & voiced understanding of those results.

## 2016-07-16 LAB — CYTOLOGY - PAP
Adequacy: ABSENT
Diagnosis: NEGATIVE
HPV: NOT DETECTED

## 2016-07-18 NOTE — Progress Notes (Signed)
Pt aware of lab results & voiced understanding of those results.

## 2016-08-23 ENCOUNTER — Encounter: Payer: Self-pay | Admitting: Internal Medicine

## 2016-10-04 ENCOUNTER — Other Ambulatory Visit: Payer: Self-pay | Admitting: Physician Assistant

## 2016-10-04 DIAGNOSIS — F988 Other specified behavioral and emotional disorders with onset usually occurring in childhood and adolescence: Secondary | ICD-10-CM

## 2016-10-04 MED ORDER — AMPHETAMINE-DEXTROAMPHETAMINE 30 MG PO TABS: 30.0000 mg | ORAL_TABLET | Freq: Every day | ORAL | 0 refills | Status: DC

## 2016-12-05 ENCOUNTER — Other Ambulatory Visit: Payer: Self-pay | Admitting: Physician Assistant

## 2016-12-05 DIAGNOSIS — F988 Other specified behavioral and emotional disorders with onset usually occurring in childhood and adolescence: Secondary | ICD-10-CM

## 2016-12-05 DIAGNOSIS — Z30011 Encounter for initial prescription of contraceptive pills: Secondary | ICD-10-CM

## 2016-12-05 MED ORDER — AMPHETAMINE-DEXTROAMPHETAMINE 30 MG PO TABS: 30.0000 mg | ORAL_TABLET | Freq: Every day | ORAL | 0 refills | Status: DC

## 2016-12-05 MED ORDER — ETONOGESTREL-ETHINYL ESTRADIOL 0.12-0.015 MG/24HR VA RING: VAGINAL_RING | VAGINAL | 1 refills | Status: DC

## 2016-12-07 ENCOUNTER — Other Ambulatory Visit: Payer: Self-pay | Admitting: Physician Assistant

## 2016-12-07 ENCOUNTER — Other Ambulatory Visit: Payer: Self-pay | Admitting: *Deleted

## 2016-12-07 DIAGNOSIS — F988 Other specified behavioral and emotional disorders with onset usually occurring in childhood and adolescence: Secondary | ICD-10-CM

## 2016-12-07 DIAGNOSIS — Z30011 Encounter for initial prescription of contraceptive pills: Secondary | ICD-10-CM

## 2016-12-07 MED ORDER — AMPHETAMINE-DEXTROAMPHETAMINE 30 MG PO TABS: 30.0000 mg | ORAL_TABLET | Freq: Every day | ORAL | 0 refills | Status: DC

## 2016-12-07 MED ORDER — ETONOGESTREL-ETHINYL ESTRADIOL 0.12-0.015 MG/24HR VA RING: VAGINAL_RING | VAGINAL | 1 refills | Status: DC

## 2016-12-11 ENCOUNTER — Encounter: Payer: Self-pay | Admitting: Physician Assistant

## 2017-01-16 ENCOUNTER — Ambulatory Visit (INDEPENDENT_AMBULATORY_CARE_PROVIDER_SITE_OTHER): Payer: Managed Care, Other (non HMO) | Admitting: Physician Assistant

## 2017-01-16 ENCOUNTER — Encounter: Payer: Self-pay | Admitting: Physician Assistant

## 2017-01-16 VITALS — BP 110/76 | HR 86 | Temp 97.5°F | Resp 18 | Ht 65.0 in | Wt 144.2 lb

## 2017-01-16 DIAGNOSIS — K2100 Gastro-esophageal reflux disease with esophagitis, without bleeding: Secondary | ICD-10-CM

## 2017-01-16 DIAGNOSIS — Z79899 Other long term (current) drug therapy: Secondary | ICD-10-CM

## 2017-01-16 DIAGNOSIS — F988 Other specified behavioral and emotional disorders with onset usually occurring in childhood and adolescence: Secondary | ICD-10-CM

## 2017-01-16 DIAGNOSIS — D649 Anemia, unspecified: Secondary | ICD-10-CM

## 2017-01-16 DIAGNOSIS — E559 Vitamin D deficiency, unspecified: Secondary | ICD-10-CM | POA: Diagnosis not present

## 2017-01-16 DIAGNOSIS — F909 Attention-deficit hyperactivity disorder, unspecified type: Secondary | ICD-10-CM

## 2017-01-16 DIAGNOSIS — R5383 Other fatigue: Secondary | ICD-10-CM

## 2017-01-16 DIAGNOSIS — F411 Generalized anxiety disorder: Secondary | ICD-10-CM

## 2017-01-16 DIAGNOSIS — E0500 Thyrotoxicosis with diffuse goiter without thyrotoxic crisis or storm: Secondary | ICD-10-CM

## 2017-01-16 DIAGNOSIS — Z13220 Encounter for screening for lipoid disorders: Secondary | ICD-10-CM

## 2017-01-16 DIAGNOSIS — R197 Diarrhea, unspecified: Secondary | ICD-10-CM

## 2017-01-16 DIAGNOSIS — Z0000 Encounter for general adult medical examination without abnormal findings: Secondary | ICD-10-CM

## 2017-01-16 DIAGNOSIS — Z1389 Encounter for screening for other disorder: Secondary | ICD-10-CM

## 2017-01-16 DIAGNOSIS — E538 Deficiency of other specified B group vitamins: Secondary | ICD-10-CM

## 2017-01-16 MED ORDER — AMPHETAMINE-DEXTROAMPHETAMINE 30 MG PO TABS: 30.0000 mg | ORAL_TABLET | Freq: Every day | ORAL | 0 refills | Status: DC

## 2017-01-16 NOTE — Patient Instructions (Addendum)
Vitamin D goal is between 60-80  Please make sure that you are taking your Vitamin D as directed.   It is very important as a natural anti-inflammatory   helping hair, skin, and nails, as well as reducing stroke and heart attack risk.   It helps your bones and helps with mood.  We want you on at least 5000 IU daily  It also decreases numerous cancer risks so please take it as directed.   Low Vit D is associated with a 200-300% higher risk for CANCER   and 200-300% higher risk for HEART   ATTACK  &  STROKE.    ......................................  It is also associated with higher death rate at younger ages,   autoimmune diseases like Rheumatoid arthritis, Lupus, Multiple Sclerosis.     Also many other serious conditions, like depression, Alzheimer's  Dementia, infertility, muscle aches, fatigue, fibromyalgia - just to name a few.  +++++++++++++++++++  Can get liquid vitamin D from [employer]  OR here in [place] at  [facility] [address], [place], [place] [address] Or you can try [employer]    Fatigue Fatigue is feeling tired all of the time, a lack of energy, or a lack of motivation. Occasional or mild fatigue is often a normal response to activity or life in general. However, long-lasting (chronic) or extreme fatigue may indicate an underlying medical condition. Follow these instructions at home: Watch your fatigue for any changes. The following actions may help to lessen any discomfort you are feeling:  Talk to your health care provider about how much sleep you need each night. Try to get the required amount every night.  Take medicines only as directed by your health care provider.  Eat a healthy and nutritious diet. Ask your health care provider if you need help changing your diet.  Drink enough fluid to keep your urine clear or pale yellow.  Practice ways of relaxing, such as yoga, meditation, massage therapy, or acupuncture.  Exercise  regularly.  Change situations that cause you stress. Try to keep your work and personal routine reasonable.  Do not abuse illegal drugs.  Limit alcohol intake to no more than 1 drink per day for nonpregnant women and 2 drinks per day for men. One drink equals 12 ounces of beer, 5 ounces of wine, or 1 ounces of hard liquor.  Take a multivitamin, if directed by your health care provider.  Contact a health care provider if:  Your fatigue does not get better.  You have a fever.  You have unintentional weight loss or gain.  You have headaches.  You have difficulty: ? Falling asleep. ? Sleeping throughout the night.  You feel angry, guilty, anxious, or sad.  You are unable to have a bowel movement (constipation).  You skin is dry.  Your legs or another part of your body is swollen. Get help right away if:  You feel confused.  Your vision is blurry.  You feel faint or pass out.  You have a severe headache.  You have severe abdominal, pelvic, or back pain.  You have chest pain, shortness of breath, or an irregular or fast heartbeat.  You are unable to urinate or you urinate less than normal.  You develop abnormal bleeding, such as bleeding from the rectum, vagina, nose, lungs, or nipples.  You vomit blood.  You have thoughts about harming yourself or committing suicide.  You are worried that you might harm someone else. This information is not intended to replace   advice given to you by your health care provider. Make sure you discuss any questions you have with your health care provider. Document Released: 01/14/2007 Document Revised: 08/25/2015 Document Reviewed: 07/21/2013 Elsevier Interactive Patient Education  2018 [employer]  

## 2017-01-16 NOTE — Progress Notes (Signed)
Complete Physical  Assessment and Plan: Gastroesophageal reflux disease with esophagitis Continue PPI/H2 blocker, diet discussed  Graves disease Monitoring for now -     TSH -     TSH; Future  Vitamin B12 deficiency -     Vitamin B12  Vitamin D deficiency -     VITAMIN D 25 Hydroxy (Vit-D Deficiency, Fractures)  Attention deficit hyperactivity disorder (ADHD), unspecified ADHD type Refill today -  Continue ADD medication, helps with focus, no AE's. The patient was counseled on the addictive nature of the medication and was encouraged to take drug holidays when not needed.   Anemia, unspecified type -     CBC with Differential/Platelet -     Iron,Total/Total Iron Binding Cap  Anxiety state continue medications, stress management techniques discussed, increase water, good sleep hygiene discussed, increase exercise, and increase veggies.   Fatigue, unspecified type - Discussed lifestyle modification as means of resolving problem, Training modifications discussed, See orders for lab evaluation, Discussed how depression can be a cause of fatigue if all labs are negative will discuss depression treatment.  -     Iron,Total/Total Iron Binding Cap -     Vitamin B12 -     Celiac Disease Comprehensive Panel with Reflexes  Diarrhea, unspecified type -     Celiac Disease Comprehensive Panel with Reflexes  Medication management -     CBC with Differential/Platelet -     BASIC METABOLIC PANEL WITH GFR -     Hepatic function panel -     Magnesium  Screening cholesterol level -     Lipid panel  Screening for hematuria or proteinuria -     Microalbumin / creatinine urine ratio   Discussed med's effects and SE's. Screening labs and tests as requested with regular follow-up as recommended. Over 40 minutes of exam, counseling, chart review, and complex, high level critical decision making was performed this visit.   HPI  31 y.o. female  presents for a complete physical and follow  up for has Anxiety state; Graves disease; GERD (gastroesophageal reflux disease); ADHD (attention deficit hyperactivity disorder); Anemia; Vitamin B12 deficiency; and Vitamin D deficiency on her problem list..  Her blood pressure has been controlled at home, today their BP is BP: 110/76 She does workout 3 days a week. She denies chest pain, shortness of breath, dizziness.  She states she has been more fatigued recently, will have fatigue with taking a shower. Has heavy menses x 2 days and then normal.  She is not on cholesterol medication and denies myalgias. Her cholesterol is at goal. The cholesterol last visit was:   Lab Results  Component Value Date   CHOL 142 08/23/2015   HDL 61 08/23/2015   LDLCALC 71 08/23/2015   TRIG 48 08/23/2015   CHOLHDL 2.3 08/23/2015   Mom broke her neck, at home 24 hours a day, has lost several family members.   Last A1C in the office was:  Lab Results  Component Value Date   HGBA1C 5.1 08/23/2015   Patient is on Vitamin D supplement, she is on vit D 1000 IU and she is on Bcomplex.   Lab Results  Component Value Date   VD25OH 14 (L) 07/11/2016     BMI is Body mass index is 24 kg/m., she is working on diet and exercise. 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  Diagnosed with graves disease several years ago but just observing, has been more fatigued. Has seen Dr. Evlyn Kanner in the past.  Lab Results  Component Value Date   TSH 0.56 07/11/2016    Current Medications:  Current Outpatient Prescriptions on File Prior to Visit  Medication Sig Dispense Refill  . amphetamine-dextroamphetamine (ADDERALL) 30 MG tablet Take 1 tablet by mouth daily. 30 tablet 0  .  etonogestrel-ethinyl estradiol (NUVARING) 0.12-0.015 MG/24HR vaginal ring Insert vaginally and leave in place for 3 consecutive weeks, then remove for 1 week. 3 each 1  . Multiple Vitamin (MULTIVITAMIN) tablet Take 1 tablet by mouth daily.    . norethindrone (MICRONOR,CAMILA,ERRIN) 0.35 MG tablet Take 1 tablet (0.35 mg total) by mouth daily. 3 Package 3   No current facility-administered medications on file prior to visit.    Allergies:  Allergies  Allergen Reactions  . Codeine Nausea And Vomiting  . Milk-Related Compounds Hives and Nausea And Vomiting  . Sulfonamide Derivatives Nausea And Vomiting  . Penicillins Nausea And Vomiting    PER PATIENT just nausea and vomiting   Medical History:  She has Anxiety state; Graves disease; GERD (gastroesophageal reflux disease); ADHD (attention deficit hyperactivity disorder); Anemia; Vitamin B12 deficiency; and Vitamin D deficiency on her problem list.  Health Maintenance:   Immunization History  Administered Date(s) Administered  . HPV Quadrivalent 09/09/2001, 06/14/2005, 10/16/2005  . Hepatitis B 04/03/1999  . Meningococcal Polysaccharide 04/03/1999  . Td 10/26/2004  . Tdap 09/13/2014   Tetanus: 2016 Pneumovax: Prevnar 13:  Flu vaccine: Zostavax:  LMP: Pap: 07/2016 history of LEEP neg HPV normal MGM:  DEXA: Colonoscopy: EGD: CT AB 2014  Last Dental Exam: Last Eye Exam: Patient Care Team: Lucky Cowboy, MD as PCP - General (Internal Medicine) Iva Boop, MD as Consulting Physician (Gastroenterology) Jerene Bears, MD as Consulting Physician (Gynecology) Gae Dry, MD (Dermatology)  Surgical History:  She has a past surgical history that includes Wisdom tooth extraction; gum grafts; and laparoscopy (02/19/2011). Family History:  Herfamily history includes Cancer in her father, maternal uncle, paternal grandfather, and paternal grandmother; Cancer (age of onset: 55) in her maternal grandmother; Heart disease in  her brother, maternal grandfather, maternal grandmother, paternal grandfather, and paternal grandmother; Heart murmur in her brother; Hyperlipidemia in her paternal grandfather; Hypertension in her paternal grandfather; Stroke (age of onset: 36) in her mother. Social History:  She reports that she has never smoked. She has never used smokeless tobacco. She reports that she does not drink alcohol or use drugs.  Review of Systems: Review of Systems  Constitutional: Positive for malaise/fatigue.  HENT: Negative.   Eyes: Negative.   Respiratory: Negative.   Cardiovascular: Negative.   Gastrointestinal: Negative.   Genitourinary: Negative.   Musculoskeletal: Negative.   Skin: Negative.   Neurological: Negative.   Endo/Heme/Allergies: Negative.   Psychiatric/Behavioral: Negative.     Physical Exam: Estimated body mass index is 24 kg/m as calculated from the following:   Height as of this encounter: 5\' 5"  (1.651 m).   Weight as of this encounter: 144 lb 3.2 oz (65.4 kg). BP 110/76   Pulse 86   Temp (!) 97.5 F (36.4 C)   Resp 18   Ht 5\' 5"  (1.651 m)   Wt 144 lb 3.2  oz (65.4 kg)   LMP 01/06/2017   SpO2 98%   BMI 24.00 kg/m  General Appearance: Well nourished, in no apparent distress.  Eyes: PERRLA, EOMs, conjunctiva no swelling or erythema, normal fundi and vessels.  Sinuses: No Frontal/maxillary tenderness  ENT/Mouth: Ext aud canals clear, normal light reflex with TMs without erythema, bulging. Good dentition. No erythema, swelling, or exudate on post pharynx. Tonsils not swollen or erythematous. Hearing normal.  Neck: Supple, thyroid normal. No bruits  Respiratory: Respiratory effort normal, BS equal bilaterally without rales, rhonchi, wheezing or stridor.  Cardio: RRR without murmurs, rubs or gallops. Brisk peripheral pulses without edema.  Chest: symmetric, with normal excursions and percussion.  Breasts: Symmetric, without lumps, nipple discharge, retractions.  Abdomen:  Soft, nontender, no guarding, rebound, hernias, masses, or organomegaly.  Lymphatics: Non tender without lymphadenopathy.  Genitourinary:  Musculoskeletal: Full ROM all peripheral extremities,5/5 strength, and normal gait.  Skin: Warm, dry without rashes, lesions, ecchymosis. Neuro: Cranial nerves intact, reflexes equal bilaterally. Normal muscle tone, no cerebellar symptoms. Sensation intact.  Psych: Awake and oriented X 3, normal affect, Insight and Judgment appropriate.   EKG: WNL no ST changes. AORTA SCAN: WNL   Quentin Mulling 10:08 AM [facility]

## 2017-01-17 LAB — HEPATIC FUNCTION PANEL
AG RATIO: 1.9 (calc) (ref 1.0–2.5)
ALBUMIN MSPROF: 4.7 g/dL (ref 3.6–5.1)
ALKALINE PHOSPHATASE (APISO): 71 U/L (ref 33–115)
ALT: 10 U/L (ref 6–29)
AST: 14 U/L (ref 10–30)
BILIRUBIN TOTAL: 0.4 mg/dL (ref 0.2–1.2)
Bilirubin, Direct: 0.1 mg/dL (ref 0.0–0.2)
Globulin: 2.5 g/dL (calc) (ref 1.9–3.7)
Indirect Bilirubin: 0.3 mg/dL (calc) (ref 0.2–1.2)
TOTAL PROTEIN: 7.2 g/dL (ref 6.1–8.1)

## 2017-01-17 LAB — CBC WITH DIFFERENTIAL/PLATELET
BASOS ABS: 57 {cells}/uL (ref 0–200)
BASOS PCT: 0.8 %
EOS PCT: 2 %
Eosinophils Absolute: 142 cells/uL (ref 15–500)
HEMATOCRIT: 37.4 % (ref 35.0–45.0)
HEMOGLOBIN: 12.5 g/dL (ref 11.7–15.5)
LYMPHS ABS: 2826 {cells}/uL (ref 850–3900)
MCH: 29.5 pg (ref 27.0–33.0)
MCHC: 33.4 g/dL (ref 32.0–36.0)
MCV: 88.2 fL (ref 80.0–100.0)
MONOS PCT: 7.6 %
MPV: 11.7 fL (ref 7.5–12.5)
NEUTROS ABS: 3536 {cells}/uL (ref 1500–7800)
Neutrophils Relative %: 49.8 %
Platelets: 315 10*3/uL (ref 140–400)
RBC: 4.24 10*6/uL (ref 3.80–5.10)
RDW: 12.6 % (ref 11.0–15.0)
Total Lymphocyte: 39.8 %
WBC mixed population: 540 cells/uL (ref 200–950)
WBC: 7.1 10*3/uL (ref 3.8–10.8)

## 2017-01-17 LAB — IRON, TOTAL/TOTAL IRON BINDING CAP
%SAT: 25 % (ref 11–50)
Iron: 83 ug/dL (ref 40–190)
TIBC: 330 ug/dL (ref 250–450)

## 2017-01-17 LAB — MICROALBUMIN / CREATININE URINE RATIO
CREATININE, URINE: 210 mg/dL (ref 20–275)
MICROALB UR: 0.6 mg/dL
MICROALB/CREAT RATIO: 3 ug/mg{creat} (ref <30)

## 2017-01-17 LAB — LIPID PANEL
CHOL/HDL RATIO: 2.1 (calc) (ref <5.0)
CHOLESTEROL: 174 mg/dL (ref <200)
HDL: 81 mg/dL (ref >50)
LDL Cholesterol (Calc): 77 mg/dL (calc)
NON-HDL CHOLESTEROL (CALC): 93 mg/dL (ref <130)
TRIGLYCERIDES: 75 mg/dL (ref <150)

## 2017-01-17 LAB — BASIC METABOLIC PANEL WITH GFR
BUN: 13 mg/dL (ref 7–25)
CALCIUM: 9.5 mg/dL (ref 8.6–10.2)
CO2: 26 mmol/L (ref 20–32)
CREATININE: 0.8 mg/dL (ref 0.50–1.10)
Chloride: 102 mmol/L (ref 98–110)
GFR, Est African American: 114 mL/min/{1.73_m2} (ref >=60)
GFR, Est Non African American: 98 mL/min/{1.73_m2} (ref >=60)
GLUCOSE: 82 mg/dL (ref 65–99)
Potassium: 4.1 mmol/L (ref 3.5–5.3)
SODIUM: 138 mmol/L (ref 135–146)

## 2017-01-17 LAB — CELIAC DISEASE COMPREHENSIVE PANEL WITH REFLEXES
(TTG) AB, IGA: 1 U/mL
Immunoglobulin A: 366 mg/dL (ref 81–463)

## 2017-01-17 LAB — VITAMIN B12: VITAMIN B 12: 316 pg/mL (ref 200–1100)

## 2017-01-17 LAB — TSH: TSH: 0.97 mIU/L

## 2017-01-17 LAB — MAGNESIUM: Magnesium: 1.9 mg/dL (ref 1.5–2.5)

## 2017-01-17 LAB — VITAMIN D 25 HYDROXY (VIT D DEFICIENCY, FRACTURES): Vit D, 25-Hydroxy: 18 ng/mL — ABNORMAL LOW (ref 30–100)

## 2017-01-18 NOTE — Progress Notes (Signed)
Pt aware of lab results & voiced understanding of those results.

## 2017-03-18 ENCOUNTER — Other Ambulatory Visit: Payer: Self-pay

## 2017-03-18 DIAGNOSIS — Z30011 Encounter for initial prescription of contraceptive pills: Secondary | ICD-10-CM

## 2017-03-18 MED ORDER — ETONOGESTREL-ETHINYL ESTRADIOL 0.12-0.015 MG/24HR VA RING: VAGINAL_RING | VAGINAL | 1 refills | Status: DC

## 2017-03-19 ENCOUNTER — Other Ambulatory Visit: Payer: Self-pay

## 2017-03-19 DIAGNOSIS — F988 Other specified behavioral and emotional disorders with onset usually occurring in childhood and adolescence: Secondary | ICD-10-CM

## 2017-03-19 MED ORDER — AMPHETAMINE-DEXTROAMPHETAMINE 30 MG PO TABS: 30.0000 mg | ORAL_TABLET | Freq: Every day | ORAL | 0 refills | Status: DC

## 2017-05-16 ENCOUNTER — Encounter: Payer: Self-pay | Admitting: Adult Health

## 2017-05-16 ENCOUNTER — Ambulatory Visit: Payer: Managed Care, Other (non HMO) | Admitting: Adult Health

## 2017-05-16 VITALS — BP 132/82 | HR 94 | Temp 98.1°F | Ht 65.0 in | Wt 141.0 lb

## 2017-05-16 DIAGNOSIS — Z30011 Encounter for initial prescription of contraceptive pills: Secondary | ICD-10-CM | POA: Diagnosis not present

## 2017-05-16 DIAGNOSIS — E0500 Thyrotoxicosis with diffuse goiter without thyrotoxic crisis or storm: Secondary | ICD-10-CM

## 2017-05-16 DIAGNOSIS — R0789 Other chest pain: Secondary | ICD-10-CM

## 2017-05-16 DIAGNOSIS — F988 Other specified behavioral and emotional disorders with onset usually occurring in childhood and adolescence: Secondary | ICD-10-CM | POA: Diagnosis not present

## 2017-05-16 DIAGNOSIS — R05 Cough: Secondary | ICD-10-CM | POA: Diagnosis not present

## 2017-05-16 DIAGNOSIS — R0609 Other forms of dyspnea: Secondary | ICD-10-CM

## 2017-05-16 DIAGNOSIS — R059 Cough, unspecified: Secondary | ICD-10-CM

## 2017-05-16 MED ORDER — AMPHETAMINE-DEXTROAMPHETAMINE 30 MG PO TABS: 30.0000 mg | ORAL_TABLET | Freq: Every day | ORAL | 0 refills | Status: DC

## 2017-05-16 MED ORDER — ETONOGESTREL-ETHINYL ESTRADIOL 0.12-0.015 MG/24HR VA RING: VAGINAL_RING | VAGINAL | 1 refills | Status: DC

## 2017-05-16 NOTE — Patient Instructions (Signed)
We will check several labs today - checking for clot in your lungs, arrhythmia/acute changes in your heart, basic labs to see if there's an infection or other obvious change.   If you continue to have symptoms, let me know and we might order a holter monitor (24- or 48- hour heart monitor tracing) to see if there are any arrhythmias. If all this is negative an you are still having problems in a few weeks, we can talk about going to cardiology.    Common causes of cough OR hoarseness OR sore throat:   Allergies, Viral Infections, Acid Reflux and Bacterial Infections.   Allergies and viral infections cause a cough OR sore throat by post nasal drip and are often worse at night, can also have sneezing, lower grade fevers, clear/yellow mucus. This is best treated with allergy medications or nasal sprays.  Please get on allegra for 1-2 weeks The strongest is allegra or fexafinadine  Cheapest at [employer], [employer], [employer]   Bacterial infections are more severe than allergies or viral infections with fever, teeth pain, fatigue. This can be treated with prednisone and the same over the counter medication and after 7 days can be treated with an antibiotic.   Silent reflux/GERD can cause a cough OR sore throat OR hoarseness WITHOUT heart burn because the esophagus that goes to the stomach and trachea that goes to the lungs are very close and when you lay down the acid can irritate your throat and lungs. This can cause hoarseness, cough, and wheezing. Please stop any alcohol or anti-inflammatories like aleve/advil/ibuprofen and start an over the counter Prilosec or omeprazole 1-2 times daily before food for 2 weeks, then switch to over the counter zantac/ratinidine or pepcid/famotadine once at night for 2 weeks.    sometimes irritation causes more irritation. Try voice rest, use sugar free cough drops to prevent coughing, and try to stop clearing your throat.   If you ever have a cough that does not go  away after trying these things please make a follow up visit for further evaluation or we can refer you to a specialist. Or if you ever have shortness of breath or chest pain go to the ER.

## 2017-05-16 NOTE — Progress Notes (Signed)
Assessment and Plan:  Diagnoses and all orders for this visit:  Sensation of chest pressure/dyspnea on exertion/cough -     CBC with Differential/Platelet -     BASIC METABOLIC PANEL WITH GFR -     TSH -     EKG 12-Lead -     D-dimer, quantitative (not at [facility]) -     DG Chest 2 View; Future  Discussed could consider holter for ?arrhythmia on exam, normal EKG-  Attention deficit disorder, unspecified hyperactivity presence Continue medications:  -     amphetamine-dextroamphetamine (ADDERALL) 30 MG tablet; Take 1 tablet by mouth daily. Helps with focus, no AE's. The patient was counseled on the addictive nature of the medication and was encouraged to take drug holidays when not needed.   OCP (oral contraceptive pills) initiation -     etonogestrel-ethinyl estradiol (NUVARING) 0.12-0.015 MG/24HR vaginal ring; Insert vaginally and leave in place for 3 consecutive weeks, then remove for 1 week.  Further disposition pending results of labs. Discussed med's effects and SE's.   Over 30 minutes of exam, counseling, chart review, and critical decision making was performed.   Future Appointments  Date Time Provider Department Center  07/17/2017 10:00 AM [facility] None  01/28/2018 10:00 AM Quentin Mullingollier, Amanda, PA-C [facility] None    ------------------------------------------------------------------------------------------------------------------   HPI BP 132/82   Pulse 94   Temp 98.1 F (36.7 C)   Ht 5\' 5"  (1.651 m)   Wt 141 lb (64 kg)   SpO2 97%   BMI 23.46 kg/m   31 y.o.female presents for c/o chest pressure, dyspnea on exertion ongoing for 2-3 weeks.   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 She reports she has cough when she attempts to breathe deeply. She reports unusual dyspnea on exertion. Has been "feeling  off." - hasn't felt up to her usual yoga, etc. She also endorses "full" sensation in her stomach today as well. She does endorse sense of intermittent palpitations, but reports this is ongoing for several years, thought to be related to thyroid hx.   She denies hx of reflux, seasonal allergies  She reports her brother has cardiac hx - "valve doesn't close completely." + hx MI in [place] in his early 5160s with quadruple bypass.   +hx of graves, ongoing monitoring of TSH has been normal -   Lab Results  Component Value Date   TSH 0.97 01/16/2017    Patient is on an ADD medication, she states that the medication is helping and she denies any adverse reactions. Requesting refill today.   She does have hx of anxiety, currently untreated.    Past Medical History:  Diagnosis Date  . ADD (attention deficit disorder with hyperactivity)   . Anemia    iron  . Anxiety   . Back pain   . GERD (gastroesophageal reflux disease)   . [place] disease 2014   [facility] [place] [place]   . Pelvic pain in female   . Vitamin B12 deficiency   . Vitamin D deficiency      Allergies  Allergen Reactions  . Codeine Nausea And Vomiting  . Milk-Related Compounds Hives and Nausea And Vomiting  . Sulfonamide Derivatives Nausea And Vomiting  . Penicillins Nausea And Vomiting    PER PATIENT just nausea and vomiting    Current Outpatient Medications on File Prior to  Visit  Medication Sig  . Multiple Vitamin (MULTIVITAMIN) tablet Take 1 tablet by mouth daily.  . norethindrone (MICRONOR,CAMILA,ERRIN) 0.35 MG tablet Take 1 tablet (0.35 mg total) by mouth daily. (Patient not taking: Reported on 05/16/2017)   No current facility-administered medications on file prior to visit.     ROS: Review of Systems  Constitutional: Positive for malaise/fatigue (Ongoing for several years). Negative for chills, fever and weight loss.  HENT: Negative for congestion, hearing loss, sinus pain, sore throat and tinnitus.   Eyes: Negative for  blurred vision and double vision.  Respiratory: Positive for cough (Reflexive with deep breaths) and shortness of breath (With exertion, atypical from normal). Negative for sputum production and wheezing.   Cardiovascular: Positive for chest pain (generalized pressure, "something sitting on my chest") and palpitations (Brief, intermittent, ongoing). Negative for orthopnea, claudication and leg swelling.  Gastrointestinal: Negative for abdominal pain, blood in stool, constipation, diarrhea, heartburn, melena, nausea and vomiting.  Genitourinary: Negative.   Musculoskeletal: Negative for joint pain and myalgias.  Skin: Negative for rash.  Neurological: Negative for dizziness, tingling, sensory change, weakness and headaches.  Endo/Heme/Allergies: Negative for polydipsia.  Psychiatric/Behavioral: The patient is nervous/anxious.   All other systems reviewed and are negative.     Physical Exam:  BP 132/82   Pulse 94   Temp 98.1 F (36.7 C)   Ht 5\' 5"  (1.651 m)   Wt 141 lb (64 kg)   SpO2 97%   BMI 23.46 kg/m   General Appearance: Well nourished, in no apparent distress. Eyes: PERRLA, EOMs, conjunctiva no swelling or erythema Sinuses: No Frontal/maxillary tenderness ENT/Mouth: Ext aud canals clear, TMs without erythema, bulging. No erythema, swelling, or exudate on post pharynx.  Tonsils not swollen or erythematous. Hearing normal.  Neck: Supple, thyroid normal.  Respiratory: Respiratory effort normal, BS equal bilaterally without rales, rhonchi, wheezing or stridor.  Cardio: ?irregular rhythm, S1/S2 present without audible murmurs. Brisk peripheral pulses without edema.  Abdomen: Soft, + BS.  Non tender, no guarding, rebound, hernias, masses. Lymphatics: Non tender without lymphadenopathy.  Musculoskeletal:  5/5 strength, normal gait.  Skin: Warm, dry without rashes, lesions, ecchymosis.  Neuro: Cranial nerves intact. Normal muscle tone, no cerebellar symptoms. Sensation intact.   Psych: Awake and oriented X 3, normal affect, Insight and Judgment appropriate.    Dan Maker, NP 5:04 PM [facility]

## 2017-05-17 ENCOUNTER — Ambulatory Visit (INDEPENDENT_AMBULATORY_CARE_PROVIDER_SITE_OTHER): Payer: Managed Care, Other (non HMO)

## 2017-05-17 ENCOUNTER — Other Ambulatory Visit: Payer: Self-pay

## 2017-05-17 ENCOUNTER — Ambulatory Visit (HOSPITAL_COMMUNITY)
Admission: EM | Admit: 2017-05-17 | Discharge: 2017-05-17 | Disposition: A | Discharge status: Home / Self Care | Payer: Managed Care, Other (non HMO) | Attending: Family Medicine | Admitting: Family Medicine

## 2017-05-17 ENCOUNTER — Encounter (HOSPITAL_COMMUNITY): Payer: Self-pay | Admitting: Family Medicine

## 2017-05-17 DIAGNOSIS — R0789 Other chest pain: Secondary | ICD-10-CM

## 2017-05-17 DIAGNOSIS — R079 Chest pain, unspecified: Secondary | ICD-10-CM | POA: Diagnosis not present

## 2017-05-17 LAB — BASIC METABOLIC PANEL WITH GFR
BUN: 10 mg/dL (ref 7–25)
CALCIUM: 9.3 mg/dL (ref 8.6–10.2)
CO2: 24 mmol/L (ref 20–32)
Chloride: 108 mmol/L (ref 98–110)
Creat: 0.8 mg/dL (ref 0.50–1.10)
GFR, EST AFRICAN AMERICAN: 114 mL/min/{1.73_m2} (ref >=60)
GFR, Est Non African American: 98 mL/min/{1.73_m2} (ref >=60)
Glucose, Bld: 94 mg/dL (ref 65–99)
POTASSIUM: 4.1 mmol/L (ref 3.5–5.3)
SODIUM: 142 mmol/L (ref 135–146)

## 2017-05-17 LAB — TSH: TSH: 0.5 mIU/L

## 2017-05-17 LAB — CBC WITH DIFFERENTIAL/PLATELET
BASOS ABS: 76 {cells}/uL (ref 0–200)
Basophils Relative: 1 %
Eosinophils Absolute: 53 cells/uL (ref 15–500)
Eosinophils Relative: 0.7 %
HCT: 37.9 % (ref 35.0–45.0)
Hemoglobin: 12.7 g/dL (ref 11.7–15.5)
Lymphs Abs: 2151 cells/uL (ref 850–3900)
MCH: 29.3 pg (ref 27.0–33.0)
MCHC: 33.5 g/dL (ref 32.0–36.0)
MCV: 87.5 fL (ref 80.0–100.0)
MPV: 12.2 fL (ref 7.5–12.5)
Monocytes Relative: 5.5 %
NEUTROS PCT: 64.5 %
Neutro Abs: 4902 cells/uL (ref 1500–7800)
PLATELETS: 291 10*3/uL (ref 140–400)
RBC: 4.33 10*6/uL (ref 3.80–5.10)
RDW: 11.8 % (ref 11.0–15.0)
TOTAL LYMPHOCYTE: 28.3 %
WBC mixed population: 418 cells/uL (ref 200–950)
WBC: 7.6 10*3/uL (ref 3.8–10.8)

## 2017-05-17 LAB — D-DIMER, QUANTITATIVE (NOT AT ARMC): D DIMER QUANT: 0.21 ug{FEU}/mL (ref <0.50)

## 2017-05-17 MED ORDER — PREDNISONE 20 MG PO TABS: ORAL_TABLET | ORAL | 0 refills | Status: DC

## 2017-05-17 NOTE — Discharge Instructions (Signed)
You have now been tested for blood clot in the lung, pneumonia, systemic infection, and heart disease. All of these tests have come back negative. Most likely problem you have as a kind of bronchitis which is not showing up on the x-ray but which may very well have to do with your extensive travel.  I have ordered a prescription for bronchial inflammation which should relieve your symptoms the next 48 hours. If you're still having problems on Sunday, return and we will take the next step which would probably involve a CAT scan.    CLINICAL DATA:  Chest heaviness.  Fullness.   EXAM: CHEST  2 VIEW   COMPARISON:  None.   FINDINGS: The heart size and mediastinal contours are within normal limits. Both lungs are clear. The visualized skeletal structures are unremarkable.   IMPRESSION: No active cardiopulmonary disease.     Electronically Signed   By: Gerome Samavid  Williams III M.D   On: 05/17/2017 14:52  Results for orders placed or performed in visit on 05/16/17  CBC with Differential/Platelet  Result Value Ref Range   WBC 7.6 3.8 - 10.8 Thousand/uL   RBC 4.33 3.80 - 5.10 Million/uL   Hemoglobin 12.7 11.7 - 15.5 g/dL   HCT 16.137.9 09.635.0 - 04.545.0 %   MCV 87.5 80.0 - 100.0 fL   MCH 29.3 27.0 - 33.0 pg   MCHC 33.5 32.0 - 36.0 g/dL   RDW 40.911.8 81.111.0 - 91.415.0 %   Platelets 291 140 - 400 Thousand/uL   MPV 12.2 7.5 - 12.5 fL   Neutro Abs 4,902 1,500 - 7,800 cells/uL   Lymphs Abs 2,151 850 - 3,900 cells/uL   WBC mixed population 418 200 - 950 cells/uL   Eosinophils Absolute 53 15 - 500 cells/uL   Basophils Absolute 76 0 - 200 cells/uL   Neutrophils Relative % 64.5 %   Total Lymphocyte 28.3 %   Monocytes Relative 5.5 %   Eosinophils Relative 0.7 %   Basophils Relative 1.0 %  BASIC METABOLIC PANEL WITH GFR  Result Value Ref Range   Glucose, Bld 94 65 - 99 mg/dL   BUN 10 7 - 25 mg/dL   Creat 7.820.80 9.560.50 - 2.131.10 mg/dL   GFR, Est Non African American 98 > OR = 60 mL/min/1.1573m2   GFR, Est African  American 114 > OR = 60 mL/min/1.1373m2   BUN/Creatinine Ratio NOT APPLICABLE 6 - 22 (calc)   Sodium 142 135 - 146 mmol/L   Potassium 4.1 3.5 - 5.3 mmol/L   Chloride 108 98 - 110 mmol/L   CO2 24 20 - 32 mmol/L   Calcium 9.3 8.6 - 10.2 mg/dL  TSH  Result Value Ref Range   TSH 0.50 mIU/L  D-dimer, quantitative (not at [facility])  Result Value Ref Range   D-Dimer, Quant 0.21 <0.50 mcg/mL FEU

## 2017-05-17 NOTE — ED Triage Notes (Addendum)
Pt here for chest pressure that has been going on for a few week and worsening. She was seen at her doctor yesterday and did a slew of tests and all negative. They did not do a chest xray. 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 No hx of DVT or PE. No recent illness. She has been extremely SOB when walking up stairs and her heart has been racing. Pt appears pale.

## 2017-05-17 NOTE — ED Provider Notes (Signed)
[facility]   [id number] 05/17/17 Arrival Time: 1244   SUBJECTIVE:  Sara Brewer is a 32 y.o. female who presents to the urgent care with complaint of chest pressure that has been going on for a few week and worsening. She was seen at her doctor yesterday and did a slew of tests and all negative. They did not do a chest xray. 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 No hx of DVT or PE. No recent illness. She has been extremely SOB when walking up stairs and her heart has been racing. Pt appears pale.    Past Medical History:  Diagnosis Date  . ADD (attention deficit disorder with hyperactivity)   . Anemia    iron  . Anxiety   . Back pain   . GERD (gastroesophageal reflux disease)   . Graves disease 2014   [facility]. [facility]   . Pelvic pain in female   . Vitamin B12 deficiency   . Vitamin D deficiency    Family History  Problem Relation Age of Onset  . Heart murmur Brother        unsure of extend but will require surgery  . Heart disease Brother        valve  . Stroke Mother [id number]       mini  . Cancer Father        melanoma  . Cancer Maternal Uncle        melanoma  . Cancer Maternal Grandmother 51       breast  . Heart disease Maternal Grandmother   . Heart disease Maternal Grandfather   . Cancer Paternal Grandmother        lung/ liver/ bone  . Heart disease Paternal Grandmother   . Cancer Paternal Grandfather        melanoma  . Heart disease Paternal Grandfather   . Hyperlipidemia Paternal Grandfather   . Hypertension Paternal Grandfather    Social History   Socioeconomic History  . Marital status: Single    Spouse name: Not on file  . Number of children: Not on file  . Years of education: Not on file  . Highest education level: Not on file  Social Needs  . Financial resource strain: Not on file  . Food insecurity - worry: Not on file  . Food insecurity - inability: Not on file  . Transportation needs - medical: Not on file  . Transportation needs -  non-medical: Not on file  Occupational History  . Not on file  Tobacco Use  . Smoking status: Never Smoker  . Smokeless tobacco: Never Used  Substance and Sexual Activity  . Alcohol use: No  . Drug use: No  . Sexual activity: Yes    Birth control/protection: Implant  Other Topics Concern  . Not on file  Social History Narrative  . Not on file   No outpatient medications have been marked as taking for the 05/17/17 encounter [facility](Hospital Encounter).   Allergies  Allergen Reactions  . Codeine Nausea And Vomiting  . Milk-Related Compounds Hives and Nausea And Vomiting  . Sulfonamide Derivatives Nausea And Vomiting  . Penicillins Nausea And Vomiting    PER PATIENT just nausea and vomiting      ROS: As per HPI, remainder of ROS negative.   OBJECTIVE:   Vitals:   05/17/17 1355  BP: (!) 145/92  Pulse: (!) 109  Resp: 18  Temp: 98.3 F (36.8 C)  SpO2: 100%     General appearance: alert;  no distress Eyes: PERRL; EOMI; conjunctiva normal HENT: normocephalic; atraumatic;  oral mucosa normal Neck: supple Lungs: clear to auscultation bilaterally Heart: regular rate and rhythm Back: no CVA tenderness Extremities: no cyanosis or edema; symmetrical with no gross deformities Skin: warm and dry Neurologic: normal gait; grossly normal Psychological: alert and cooperative; normal mood and affect      Labs:  Results for orders placed or performed in visit on 05/16/17  CBC with Differential/Platelet  Result Value Ref Range   WBC 7.6 3.8 - 10.8 Thousand/uL   RBC 4.33 3.80 - 5.10 Million/uL   Hemoglobin 12.7 11.7 - 15.5 g/dL   HCT 96.0 45.4 - 09.8 %   MCV 87.5 80.0 - 100.0 fL   MCH 29.3 27.0 - 33.0 pg   MCHC 33.5 32.0 - 36.0 g/dL   RDW 11.9 14.7 - 82.9 %   Platelets 291 140 - 400 Thousand/uL   MPV 12.2 7.5 - 12.5 fL   Neutro Abs 4,902 1,500 - 7,800 cells/uL   Lymphs Abs 2,151 850 - 3,900 cells/uL   WBC mixed population 418 200 - 950 cells/uL   Eosinophils Absolute 53  15 - 500 cells/uL   Basophils Absolute 76 0 - 200 cells/uL   Neutrophils Relative % 64.5 %   Total Lymphocyte 28.3 %   Monocytes Relative 5.5 %   Eosinophils Relative 0.7 %   Basophils Relative 1.0 %  BASIC METABOLIC PANEL WITH GFR  Result Value Ref Range   Glucose, Bld 94 65 - 99 mg/dL   BUN 10 7 - 25 mg/dL   Creat 5.62 1.30 - 8.65 mg/dL   GFR, Est Non African American 98 > OR = 60 mL/min/1.69m2   GFR, Est African American 114 > OR = 60 mL/min/1.35m2   BUN/Creatinine Ratio NOT APPLICABLE 6 - 22 (calc)   Sodium 142 135 - 146 mmol/L   Potassium 4.1 3.5 - 5.3 mmol/L   Chloride 108 98 - 110 mmol/L   CO2 24 20 - 32 mmol/L   Calcium 9.3 8.6 - 10.2 mg/dL  TSH  Result Value Ref Range   TSH 0.50 mIU/L  D-dimer, quantitative (not at [facility])  Result Value Ref Range   D-Dimer, Quant 0.21 <0.50 mcg/mL FEU    EKG:  NSR    ASSESSMENT & PLAN:  1. Atypical chest pain   2. Chest pain     Meds ordered this encounter  Medications  . predniSONE (DELTASONE) 20 MG tablet    Sig: Two daily with food    Dispense:  10 tablet    Refill:  0    Reviewed expectations re: course of current medical issues. Questions answered. Outlined signs and symptoms indicating need for more acute intervention. Patient verbalized understanding. After Visit Summary given.    Procedures:      Elvina Sidle, MD 05/17/17 1458

## 2017-07-17 ENCOUNTER — Other Ambulatory Visit: Payer: Managed Care, Other (non HMO)

## 2017-07-17 DIAGNOSIS — E0500 Thyrotoxicosis with diffuse goiter without thyrotoxic crisis or storm: Secondary | ICD-10-CM

## 2017-07-17 LAB — TSH: TSH: 0.76 mIU/L

## 2017-08-27 ENCOUNTER — Other Ambulatory Visit: Payer: Self-pay

## 2017-08-27 DIAGNOSIS — F988 Other specified behavioral and emotional disorders with onset usually occurring in childhood and adolescence: Secondary | ICD-10-CM

## 2017-08-27 DIAGNOSIS — Z30011 Encounter for initial prescription of contraceptive pills: Secondary | ICD-10-CM

## 2017-08-27 MED ORDER — AMPHETAMINE-DEXTROAMPHETAMINE 30 MG PO TABS: 30.0000 mg | ORAL_TABLET | Freq: Every day | ORAL | 0 refills | Status: DC

## 2017-08-27 MED ORDER — ETONOGESTREL-ETHINYL ESTRADIOL 0.12-0.015 MG/24HR VA RING
VAGINAL_RING | VAGINAL | 1 refills | Status: DC
Start: 2017-08-27 — End: 2018-04-29

## 2017-08-27 NOTE — Telephone Encounter (Signed)
Refill request for Adderall and Nuvaring. Last office visit on 05/16/17. Next office visit on 01/28/18. PMP checked, last filled Adderall on 06/12/17, #30 for 30 days.

## 2017-10-15 ENCOUNTER — Telehealth: Payer: Self-pay | Admitting: *Deleted

## 2017-10-15 MED ORDER — AZITHROMYCIN 250 MG PO TABS: ORAL_TABLET | ORAL | 0 refills | Status: AC

## 2017-10-15 NOTE — Telephone Encounter (Signed)
Patient called and states she has sinus pressure, sneezing and stuffy nose.  She reports a productive cough. She is unable to take off from work for an OV. Per Dr Oneta RackMcKeown, an RX for a Z-pak has been sent to her pharmacy.  The patient is aware.

## 2017-10-16 ENCOUNTER — Ambulatory Visit: Payer: Self-pay | Admitting: Adult Health

## 2017-12-20 DIAGNOSIS — Z23 Encounter for immunization: Secondary | ICD-10-CM | POA: Diagnosis not present

## 2017-12-20 DIAGNOSIS — K439 Ventral hernia without obstruction or gangrene: Secondary | ICD-10-CM | POA: Diagnosis not present

## 2017-12-20 DIAGNOSIS — K921 Melena: Secondary | ICD-10-CM | POA: Diagnosis not present

## 2017-12-20 DIAGNOSIS — M545 Low back pain: Secondary | ICD-10-CM | POA: Diagnosis not present

## 2017-12-26 DIAGNOSIS — M5441 Lumbago with sciatica, right side: Secondary | ICD-10-CM | POA: Diagnosis not present

## 2018-01-01 DIAGNOSIS — M5441 Lumbago with sciatica, right side: Secondary | ICD-10-CM | POA: Diagnosis not present

## 2018-01-01 DIAGNOSIS — R1013 Epigastric pain: Secondary | ICD-10-CM | POA: Diagnosis not present

## 2018-01-01 DIAGNOSIS — K921 Melena: Secondary | ICD-10-CM | POA: Diagnosis not present

## 2018-01-23 DIAGNOSIS — M545 Low back pain: Secondary | ICD-10-CM | POA: Diagnosis not present

## 2018-01-27 NOTE — Progress Notes (Signed)
CPE   Graves disease Continue to monitor  Attention deficit hyperactivity disorder (ADHD), unspecified ADHD type -     TSH - continue adderall just on days she works  Vitamin B12 deficiency -     Vitamin B12  Anemia, unspecified type -     CBC with Differential/Platelet -     Iron,Total/Total Iron Binding Cap  Vitamin D deficiency -     VITAMIN D 25 Hydroxy (Vit-D Deficiency, Fractures)  Anxiety state Monitor for now, ? Benefit from meds Patient very disgruntled and states we have not followed up with her on anything though there is documentation otherwise, long discussion about her also being able to contact our office if any issues.   Gastroesophageal reflux disease with esophagitis No symptoms at this time  Screening for hematuria or proteinuria -     Urinalysis, Routine w reflex microscopic -     Microalbumin / creatinine urine ratio  Medication management -     COMPLETE METABOLIC PANEL WITH GFR -     Magnesium  Screening cholesterol level -     Lipid panel  Encounter for general adult medical examination with abnormal findings 1 year  Dyspnea on exertion -     Declines EKG, had recent EKG- likely needs holter monitor -     Ambulatory referral to Cardiology  Chronic pain of both feet With rash will rule out psoriatic arthritis With graves disease will rule out other autoimmune issues being a factor of her mutlituide of complaints Currently patient has NUVA RING in, can not test hormones and have lower priority, will get these labs first. -     Sedimentation rate -     ANA -     Anti-DNA antibody, double-stranded -     Rheumatoid factor -     Cyclic citrul peptide antibody, IgG -     HLA-B27 antigen -     RNP Antibody -     Centromere Antibodies; Future -     Sjogrens syndrome-B extractable nuclear antibody -     Sjogrens syndrome-A extractable nuclear antibody   LONG DISCUSS WITH THE PATIENT MAY STILL FIND ANOTHER OFFICE/PRACITCE IF SHE IS NOT HAPPY  WITH OUR CARE HERE.  Continue prudent diet as discussed, weight control, regular exercise, and medications. Routine screening labs and tests as requested with regular follow-up as recommended.  Over 40 minutes of exam, counseling, chart review and critical decision making was performed   Annual Screening Comprehensive Examination   This very nice 32 y.o.female presents for complete physical.  Patient has a history of ADD, graves disease, and abnormal pap smear with LEEP procedure. Patient presents with a multitude of issues that she feels has been ignored for years, since she was 49, and is demanding her hormones be checked.   She has been on Nuvaring, states her GYN can't check her hormones because it is not covered under insurance but we can, she has history of irregular menses and very heavy menses.   She has SOB with exertion x 2 years, will have heaviness in her chest occ, has had palpitations occ with the SOB and sometime without, had negative Ddimer, and has been to ER x 2. She has had a negative CXR. She is on adderall but will happen days she does not take adderall.  She is currently on Adderall daily for work.  She does work as a Catering manager .    She has seen ortho, has been on meloxicam, bilateral feet pain,  told RA, worse left foot than right foot. She has migrating joint complaints, she has had a rash on nap of her neck x years, will get better but never goes away. She has history of Graves, on other family history of autoimmune.   She has B12 def in the past but states she was "never told to get on it."   Last pap smear was in 09/2014.    Finally, patient has history of Vitamin D Deficiency, 2000 and last vitamin D was  Lab Results  Component Value Date   VD25OH 18 (L) 01/16/2017    Current Outpatient Medications (Endocrine & Metabolic):  .  etonogestrel-ethinyl estradiol (NUVARING) 0.12-0.015 MG/24HR vaginal ring, Insert vaginally and leave in place for 3 consecutive  weeks, then remove for 1 week.    Current Outpatient Medications (Analgesics):  Marland Kitchen  MELOXICAM PO, Take by mouth.   Current Outpatient Medications (Other):  .  amphetamine-dextroamphetamine (ADDERALL) 30 MG tablet, Take 1 tablet by mouth daily. .  Multiple Vitamin (MULTIVITAMIN) tablet, Take 1 tablet by mouth daily.  Allergies Allergies  Allergen Reactions  . Codeine Nausea And Vomiting  . Milk-Related Compounds Hives and Nausea And Vomiting  . Sulfonamide Derivatives Nausea And Vomiting  . Penicillins Nausea And Vomiting    PER PATIENT just nausea and vomiting    SURGICAL HISTORY She  has a past surgical history that includes Wisdom tooth extraction; gum grafts; and laparoscopy (02/19/2011). FAMILY HISTORY Her family history includes Cancer in her father, maternal uncle, paternal grandfather, and paternal grandmother; Cancer (age of onset: 81) in her maternal grandmother; Heart disease in her brother, maternal grandfather, maternal grandmother, paternal grandfather, and paternal grandmother; Heart murmur in her brother; Hyperlipidemia in her paternal grandfather; Hypertension in her paternal grandfather; Stroke (age of onset: 51) in her mother. SOCIAL HISTORY She  reports that she has never smoked. She has never used smokeless tobacco. She reports that she does not drink alcohol or use drugs.  Review of Systems  Constitutional: Positive for malaise/fatigue. Negative for chills and fever.  HENT: Positive for ear pain. Negative for congestion and sore throat.   Eyes: Negative.   Respiratory: Positive for shortness of breath. Negative for cough, hemoptysis, sputum production and wheezing.   Cardiovascular: Positive for chest pain. Negative for palpitations and leg swelling.  Gastrointestinal: Negative for abdominal pain, blood in stool, constipation, diarrhea, heartburn, melena, nausea and vomiting.  Genitourinary: Negative.   Musculoskeletal: Positive for joint pain and myalgias.   Skin: Positive for itching and rash.  Neurological: Negative for dizziness, loss of consciousness and headaches.  Psychiatric/Behavioral: Negative for depression. The patient is nervous/anxious. The patient does not have insomnia.       Physical Exam  BP 110/80   Pulse 80   Temp 98.4 F (36.9 C)   Resp 16   Ht '5\' 5"'$  (1.651 m)   Wt 156 lb 12.8 oz (71.1 kg)   SpO2 99%   BMI 26.09 kg/m   General Appearance: Well nourished and in no apparent distress. Eyes: PERRLA, EOMs, conjunctiva no swelling or erythema, normal fundi and vessels. Sinuses: No frontal/maxillary tenderness ENT/Mouth: EACs patent / TMs  nl. Nares clear without erythema, swelling, mucoid exudates. Oral hygiene is good. No erythema, swelling, or exudate. Tongue normal, non-obstructing. Tonsils not swollen or erythematous. Hearing normal.  Neck: Supple, thyroid normal. No bruits, nodes or JVD. Respiratory: Respiratory effort normal.  BS equal and clear bilateral without rales, rhonci, wheezing or stridor. Cardio: Heart sounds are normal with  regular rate and rhythm and no murmurs, rubs or gallops. Peripheral pulses are normal and equal bilaterally without edema. No aortic or femoral bruits. Chest: symmetric with normal excursions and percussion. Breasts: defer GYN Abdomen: Flat, soft, with bowl sounds. Nontender, no guarding, rebound, hernias, masses, or organomegaly.  Lymphatics: Non tender without lymphadenopathy.  Musculoskeletal: Full ROM all peripheral extremities, joint stability, 5/5 strength, and normal gait. Skin: Erythematous scaly rash on nape of neck. Warm and dry without rashes, lesions, cyanosis, clubbing or  ecchymosis.  Neuro: Cranial nerves intact, reflexes equal bilaterally. Normal muscle tone, no cerebellar symptoms. Sensation intact.  Pysch: Awake and oriented X 3, normal affect, Insight and Judgment appropriate.

## 2018-01-28 ENCOUNTER — Ambulatory Visit: Payer: Managed Care, Other (non HMO) | Admitting: Physician Assistant

## 2018-01-28 ENCOUNTER — Encounter: Payer: Self-pay | Admitting: Physician Assistant

## 2018-01-28 VITALS — BP 110/80 | HR 80 | Temp 98.4°F | Resp 16 | Ht 65.0 in | Wt 156.8 lb

## 2018-01-28 DIAGNOSIS — Z79899 Other long term (current) drug therapy: Secondary | ICD-10-CM

## 2018-01-28 DIAGNOSIS — E559 Vitamin D deficiency, unspecified: Secondary | ICD-10-CM

## 2018-01-28 DIAGNOSIS — G8929 Other chronic pain: Secondary | ICD-10-CM

## 2018-01-28 DIAGNOSIS — Z0000 Encounter for general adult medical examination without abnormal findings: Secondary | ICD-10-CM

## 2018-01-28 DIAGNOSIS — E538 Deficiency of other specified B group vitamins: Secondary | ICD-10-CM

## 2018-01-28 DIAGNOSIS — R0609 Other forms of dyspnea: Secondary | ICD-10-CM

## 2018-01-28 DIAGNOSIS — Z1389 Encounter for screening for other disorder: Secondary | ICD-10-CM

## 2018-01-28 DIAGNOSIS — K2100 Gastro-esophageal reflux disease with esophagitis, without bleeding: Secondary | ICD-10-CM

## 2018-01-28 DIAGNOSIS — M79671 Pain in right foot: Secondary | ICD-10-CM

## 2018-01-28 DIAGNOSIS — F411 Generalized anxiety disorder: Secondary | ICD-10-CM

## 2018-01-28 DIAGNOSIS — F909 Attention-deficit hyperactivity disorder, unspecified type: Secondary | ICD-10-CM

## 2018-01-28 DIAGNOSIS — Z130 Encounter for screening for diseases of the blood and blood-forming organs and certain disorders involving the immune mechanism: Secondary | ICD-10-CM

## 2018-01-28 DIAGNOSIS — Z13220 Encounter for screening for lipoid disorders: Secondary | ICD-10-CM

## 2018-01-28 DIAGNOSIS — Z1329 Encounter for screening for other suspected endocrine disorder: Secondary | ICD-10-CM

## 2018-01-28 DIAGNOSIS — D649 Anemia, unspecified: Secondary | ICD-10-CM

## 2018-01-28 DIAGNOSIS — E0500 Thyrotoxicosis with diffuse goiter without thyrotoxic crisis or storm: Secondary | ICD-10-CM

## 2018-01-28 DIAGNOSIS — Z0001 Encounter for general adult medical examination with abnormal findings: Secondary | ICD-10-CM

## 2018-01-28 DIAGNOSIS — Z131 Encounter for screening for diabetes mellitus: Secondary | ICD-10-CM

## 2018-01-28 DIAGNOSIS — M79672 Pain in left foot: Secondary | ICD-10-CM

## 2018-01-28 NOTE — Patient Instructions (Signed)
B12 is low end of normal, add sublingual B12 1000-2500 mcg. The sublingual is better than the pills because likely you are not absorbing in your intestines well so the sublingual gets absorbed through your mouth and ensures you get the amount you need. Will help with energy, memory/concentration, decrease nerve pain, and help with weight loss. B12 is water soluble vitamin so you can not over dose on it, and anything you do not use will be sent out in your urine.   Add on 5000 IU vitamin D 1-2 a day   Vitamin B12 Deficiency Vitamin B12 deficiency occurs when the body does not have enough vitamin B12. Vitamin B12 is an important vitamin. The body needs vitamin B12:  To make red blood cells.  To make DNA. This is the genetic material inside cells.  To help the nerves work properly so they can carry messages from the brain to the body.  Vitamin B12 deficiency can cause various health problems, such as a low red blood cell count (anemia) or nerve damage. What are the causes? This condition may be caused by:  Not eating enough foods that contain vitamin B12.  Not having enough stomach acid and digestive fluids to properly absorb vitamin B12 from the food that you eat.  Certain digestive system diseases that make it hard to absorb vitamin B12. These diseases include Crohn disease, chronic pancreatitis, and cystic fibrosis.  Pernicious anemia. This is a condition in which the body does not make enough of a protein (intrinsic factor), resulting in too few red blood cells.  Having a surgery in which part of the stomach or small intestine is removed.  Taking certain medicines that make it hard for the body to absorb vitamin B12. These medicines include: ? Heartburn medicine (antacids and proton pump inhibitors). ? An antibiotic medicine called neomycin. ? Some medicines that are used to treat diabetes, tuberculosis, gout, or high cholesterol.  What increases the risk? The following factors may  make you more likely to develop a B12 deficiency:  Being older than age 57.  Eating a vegetarian or vegan diet, especially while you are pregnant.  Eating a poor diet while you are pregnant.  Taking certain drugs.  Having alcoholism.  What are the signs or symptoms? In some cases, there are no symptoms of this condition. If the condition leads to anemia or nerve damage, various symptoms can occur, such as:  Weakness.  Fatigue.  Loss of appetite.  Weight loss.  Numbness or tingling in your hands and feet.  Redness and burning of the tongue.  Confusion or memory problems.  Depression.  Sensory problems, such as color blindness, ringing in the ears, or loss of taste.  Diarrhea or constipation.  Trouble walking.  If anemia is severe, symptoms can include:  Shortness of breath.  Dizziness.  Rapid heart rate (tachycardia).  How is this diagnosed? This condition may be diagnosed with a blood test to measure the level of vitamin B12 in your blood. You may have other tests to help find the cause of your vitamin B12 deficiency. These tests may include:  A complete blood count (CBC). This is a group of tests that measure certain characteristics of blood cells.  A blood test to measure intrinsic factor.  An endoscopy. In this procedure, a thin tube with a camera on the end is used to look into your stomach or intestines.  How is this treated? Treatment for this condition depends on the cause. Common treatment options include:  Changing your eating and drinking habits, such as: ? Eating more foods that contain vitamin B12. ? Drinking less alcohol or no alcohol.  Taking vitamin B12 supplements. Your health care provider will tell you which dosage is best for you.  Getting vitamin B12 injections.  Follow these instructions at home:  Take supplements only as told by your health care provider. Follow the directions carefully.  Get any injections that are prescribed  by your health care provider.  Do not miss your appointments.  Eat lots of healthy foods that contain vitamin B12. Ask your health care provider if you should work with a dietitian. Foods that contain vitamin B12 include: ? Meat. ? Meat from birds (poultry). ? Fish. ? Eggs. ? Cereal and dairy products that are fortified. This means that vitamin B12 has been added to the food. Check the label on the package to see if the food is fortified.  Do not abuse alcohol.  Keep all follow-up visits as told by your health care provider. This is important. Contact a health care provider if:  Your symptoms come back. Get help right away if:  You develop shortness of breath.  You have chest pain.  You become dizzy or you lose consciousness. This information is not intended to replace advice given to you by your health care provider. Make sure you discuss any questions you have with your health care provider. Document Released: 06/11/2011 Document Revised: 08/31/2015 Document Reviewed: 08/04/2014 Elsevier Interactive Patient Education  2018 [employer].

## 2018-01-29 DIAGNOSIS — K2950 Unspecified chronic gastritis without bleeding: Secondary | ICD-10-CM | POA: Diagnosis not present

## 2018-01-29 DIAGNOSIS — R1013 Epigastric pain: Secondary | ICD-10-CM | POA: Diagnosis not present

## 2018-01-29 DIAGNOSIS — K921 Melena: Secondary | ICD-10-CM | POA: Diagnosis not present

## 2018-01-29 DIAGNOSIS — K641 Second degree hemorrhoids: Secondary | ICD-10-CM | POA: Diagnosis not present

## 2018-01-31 LAB — COMPLETE METABOLIC PANEL WITH GFR
AG RATIO: 1.7 (calc) (ref 1.0–2.5)
ALBUMIN MSPROF: 4.6 g/dL (ref 3.6–5.1)
ALKALINE PHOSPHATASE (APISO): 72 U/L (ref 33–115)
ALT: 10 U/L (ref 6–29)
AST: 14 U/L (ref 10–30)
BUN: 15 mg/dL (ref 7–25)
CO2: 22 mmol/L (ref 20–32)
CREATININE: 0.94 mg/dL (ref 0.50–1.10)
Calcium: 9.4 mg/dL (ref 8.6–10.2)
Chloride: 107 mmol/L (ref 98–110)
GFR, Est African American: 93 mL/min/{1.73_m2} (ref >=60)
GFR, Est Non African American: 80 mL/min/{1.73_m2} (ref >=60)
GLOBULIN: 2.7 g/dL (ref 1.9–3.7)
Glucose, Bld: 84 mg/dL (ref 65–99)
POTASSIUM: 4.4 mmol/L (ref 3.5–5.3)
SODIUM: 140 mmol/L (ref 135–146)
Total Bilirubin: 0.3 mg/dL (ref 0.2–1.2)
Total Protein: 7.3 g/dL (ref 6.1–8.1)

## 2018-01-31 LAB — IRON, TOTAL/TOTAL IRON BINDING CAP
%SAT: 19 % (calc) (ref 16–45)
Iron: 77 ug/dL (ref 40–190)
TIBC: 403 mcg/dL (calc) (ref 250–450)

## 2018-01-31 LAB — LIPID PANEL
CHOL/HDL RATIO: 2.4 (calc) (ref <5.0)
CHOLESTEROL: 163 mg/dL (ref <200)
HDL: 67 mg/dL (ref >50)
LDL Cholesterol (Calc): 75 mg/dL (calc)
Non-HDL Cholesterol (Calc): 96 mg/dL (calc) (ref <130)
Triglycerides: 129 mg/dL (ref <150)

## 2018-01-31 LAB — INSULIN, RANDOM: Insulin: 9.8 u[IU]/mL (ref 2.0–19.6)

## 2018-01-31 LAB — URINALYSIS, ROUTINE W REFLEX MICROSCOPIC
Bilirubin Urine: NEGATIVE
GLUCOSE, UA: NEGATIVE
Hgb urine dipstick: NEGATIVE
Ketones, ur: NEGATIVE
Nitrite: NEGATIVE
Protein, ur: NEGATIVE
RBC / HPF: NONE SEEN /HPF (ref 0–2)
Specific Gravity, Urine: 1.028 (ref 1.001–1.03)

## 2018-01-31 LAB — CBC WITH DIFFERENTIAL/PLATELET
BASOS PCT: 0.9 %
Basophils Absolute: 62 cells/uL (ref 0–200)
Eosinophils Absolute: 207 cells/uL (ref 15–500)
Eosinophils Relative: 3 %
HCT: 41.5 % (ref 35.0–45.0)
HEMOGLOBIN: 13.6 g/dL (ref 11.7–15.5)
Lymphs Abs: 1932 cells/uL (ref 850–3900)
MCH: 29.4 pg (ref 27.0–33.0)
MCHC: 32.8 g/dL (ref 32.0–36.0)
MCV: 89.8 fL (ref 80.0–100.0)
MPV: 12.4 fL (ref 7.5–12.5)
Monocytes Relative: 6.2 %
NEUTROS ABS: 4271 {cells}/uL (ref 1500–7800)
Neutrophils Relative %: 61.9 %
PLATELETS: 322 10*3/uL (ref 140–400)
RBC: 4.62 10*6/uL (ref 3.80–5.10)
RDW: 11.8 % (ref 11.0–15.0)
Total Lymphocyte: 28 %
WBC: 6.9 10*3/uL (ref 3.8–10.8)
WBCMIX: 428 {cells}/uL (ref 200–950)

## 2018-01-31 LAB — MAGNESIUM: Magnesium: 2.2 mg/dL (ref 1.5–2.5)

## 2018-01-31 LAB — SEDIMENTATION RATE: Sed Rate: 2 mm/h (ref 0–20)

## 2018-01-31 LAB — TESTOSTERONE, TOTAL, LC/MS/MS: TESTOSTERONE, TOTAL, LC-MS-MS: 18 ng/dL (ref 2–45)

## 2018-01-31 LAB — RNP ANTIBODY: Ribonucleic Protein(ENA) Antibody, IgG: <1 AI

## 2018-01-31 LAB — RHEUMATOID FACTOR: Rhuematoid fact SerPl-aCnc: <14 IU/mL (ref <14)

## 2018-01-31 LAB — MICROALBUMIN / CREATININE URINE RATIO
Creatinine, Urine: 284 mg/dL — ABNORMAL HIGH (ref 20–275)
MICROALB/CREAT RATIO: 4 ug/mg{creat} (ref <30)
Microalb, Ur: 1.2 mg/dL

## 2018-01-31 LAB — ANA: Anti Nuclear Antibody(ANA): NEGATIVE

## 2018-01-31 LAB — ANTI-DNA ANTIBODY, DOUBLE-STRANDED: ds DNA Ab: <1 IU/mL

## 2018-01-31 LAB — VITAMIN B12: VITAMIN B 12: 285 pg/mL (ref 200–1100)

## 2018-01-31 LAB — SJOGRENS SYNDROME-A EXTRACTABLE NUCLEAR ANTIBODY: SSA (Ro) (ENA) Antibody, IgG: <1 AI

## 2018-01-31 LAB — HLA-B27 ANTIGEN: HLA-B27 Antigen: NEGATIVE

## 2018-01-31 LAB — TSH: TSH: 0.5 mIU/L

## 2018-01-31 LAB — CYCLIC CITRUL PEPTIDE ANTIBODY, IGG: Cyclic Citrullin Peptide Ab: <16 UNITS

## 2018-01-31 LAB — VITAMIN D 25 HYDROXY (VIT D DEFICIENCY, FRACTURES): VIT D 25 HYDROXY: 15 ng/mL — AB (ref 30–100)

## 2018-01-31 LAB — SJOGRENS SYNDROME-B EXTRACTABLE NUCLEAR ANTIBODY: SSB (LA) (ENA) ANTIBODY, IGG: NEGATIVE AI

## 2018-04-29 ENCOUNTER — Other Ambulatory Visit: Payer: Self-pay

## 2018-04-29 ENCOUNTER — Telehealth: Payer: Self-pay | Admitting: Physician Assistant

## 2018-04-29 DIAGNOSIS — Z30011 Encounter for initial prescription of contraceptive pills: Secondary | ICD-10-CM

## 2018-04-29 DIAGNOSIS — F988 Other specified behavioral and emotional disorders with onset usually occurring in childhood and adolescence: Secondary | ICD-10-CM

## 2018-04-29 MED ORDER — AMPHETAMINE-DEXTROAMPHETAMINE 30 MG PO TABS: 30.0000 mg | ORAL_TABLET | Freq: Every day | ORAL | 0 refills | Status: DC

## 2018-04-29 MED ORDER — ETONOGESTREL-ETHINYL ESTRADIOL 0.12-0.015 MG/24HR VA RING: VAGINAL_RING | VAGINAL | 0 refills | Status: DC

## 2018-04-29 NOTE — Telephone Encounter (Signed)
Refill adderall

## 2018-05-26 ENCOUNTER — Ambulatory Visit: Payer: Self-pay | Admitting: Internal Medicine

## 2018-05-26 ENCOUNTER — Encounter: Payer: Self-pay | Admitting: Internal Medicine

## 2018-05-26 ENCOUNTER — Ambulatory Visit: Payer: Managed Care, Other (non HMO) | Admitting: Internal Medicine

## 2018-05-26 VITALS — BP 102/78 | HR 76 | Temp 97.3°F | Resp 16 | Ht 65.0 in | Wt 156.0 lb

## 2018-05-26 DIAGNOSIS — H65191 Other acute nonsuppurative otitis media, right ear: Secondary | ICD-10-CM | POA: Diagnosis not present

## 2018-05-26 DIAGNOSIS — J0140 Acute pansinusitis, unspecified: Secondary | ICD-10-CM | POA: Diagnosis not present

## 2018-05-26 DIAGNOSIS — J040 Acute laryngitis: Secondary | ICD-10-CM | POA: Diagnosis not present

## 2018-05-26 DIAGNOSIS — J00 Acute nasopharyngitis [common cold]: Secondary | ICD-10-CM

## 2018-05-26 MED ORDER — PREDNISONE 20 MG PO TABS: ORAL_TABLET | ORAL | 0 refills | Status: DC

## 2018-05-26 MED ORDER — AZITHROMYCIN 250 MG PO TABS: ORAL_TABLET | ORAL | 2 refills | Status: DC

## 2018-05-26 NOTE — Patient Instructions (Signed)
Otitis Media, Adult  Otitis media means that the middle ear is red and swollen (inflamed) and full of fluid. The condition usually goes away on its own. Follow these instructions at home:  Take over-the-counter and prescription medicines only as told by your doctor.  If you were prescribed an antibiotic medicine, take it as told by your doctor. Do not stop taking the antibiotic even if you start to feel better.  Keep all follow-up visits as told by your doctor. This is important. Contact a doctor if:  You have bleeding from your nose.  There is a lump on your neck.  You are not getting better in 5 days.  You feel worse instead of better. Get help right away if:  You have pain that is not helped with medicine.  You have swelling, redness, or pain around your ear.  You get a stiff neck.  You cannot move part of your face (paralyzed).  You notice that the bone behind your ear hurts when you touch it.  You get a very bad headache. Summary  Otitis media means that the middle ear is red, swollen, and full of fluid.  This condition usually goes away on its own. In some cases, treatment may be needed.  If you were prescribed an antibiotic medicine, take it as told by your doctor. +++++++++++++++++++++++++++++++++++++++++++++++  Sinusitis, Adult Sinusitis is soreness and swelling (inflammation) of your sinuses. Sinuses are hollow spaces in the bones around your face. They are located:  Around your eyes.  In the middle of your forehead.  Behind your nose.  In your cheekbones. Your sinuses and nasal passages are lined with a fluid called mucus. Mucus drains out of your sinuses. Swelling can trap mucus in your sinuses. This lets germs (bacteria, virus, or fungus) grow, which leads to infection. Most of the time, this condition is caused by a virus. What are the causes? This condition is caused by:  Allergies.  Asthma.  Germs.  Things that block your nose or  sinuses.  Growths in the nose (nasal polyps).  Chemicals or irritants in the air.  Fungus (rare). What increases the risk? You are more likely to develop this condition if:  You have a weak body defense system (immune system).  You do a lot of swimming or diving.  You use nasal sprays too much.  You smoke. What are the signs or symptoms? The main symptoms of this condition are pain and a feeling of pressure around the sinuses. Other symptoms include:  Stuffy nose (congestion).  Runny nose (drainage).  Swelling and warmth in the sinuses.  Headache.  Toothache.  A cough that may get worse at night.  Mucus that collects in the throat or the back of the nose (postnasal drip).  Being unable to smell and taste.  Being very tired (fatigue).  A fever.  Sore throat.  Bad breath. How is this diagnosed? This condition is diagnosed based on:  Your symptoms.  Your medical history.  A physical exam.  Tests to find out if your condition is short-term (acute) or long-term (chronic). Your doctor may: ? Check your nose for growths (polyps). ? Check your sinuses using a tool that has a light (endoscope). ? Check for allergies or germs. ? Do imaging tests, such as an MRI or CT scan. How is this treated? Treatment for this condition depends on the cause and whether it is short-term or long-term.  If caused by a virus, your symptoms should go away on their  own within 10 days. You may be given medicines to relieve symptoms. They include: ? Medicines that shrink swollen tissue in the nose. ? Medicines that treat allergies (antihistamines). ? A spray that treats swelling of the nostrils. ? Rinses that help get rid of thick mucus in your nose (nasal saline washes).  If caused by bacteria, your doctor may wait to see if you will get better without treatment. You may be given antibiotic medicine if you have: ? A very bad infection. ? A weak body defense system.  If caused  by growths in the nose, you may need to have surgery. Follow these instructions at home: Medicines  Take, use, or apply over-the-counter and prescription medicines only as told by your doctor. These may include nasal sprays.  If you were prescribed an antibiotic medicine, take it as told by your doctor. Do not stop taking the antibiotic even if you start to feel better. Hydrate and humidify   Drink enough water to keep your pee (urine) pale yellow.  Use a cool mist humidifier to keep the humidity level in your home above 50%.  Breathe in steam for 10-15 minutes, 3-4 times a day, or as told by your doctor. You can do this in the bathroom while a hot shower is running.  Try not to spend time in cool or dry air. Rest  Rest as much as you can.  Sleep with your head raised (elevated).  Make sure you get enough sleep each night. General instructions   Put a warm, moist washcloth on your face 3-4 times a day, or as often as told by your doctor. This will help with discomfort.  Wash your hands often with soap and water. If there is no soap and water, use hand sanitizer.  Do not smoke. Avoid being around people who are smoking (secondhand smoke).  Keep all follow-up visits as told by your doctor. This is important. Contact a doctor if:  You have a fever.  Your symptoms get worse.  Your symptoms do not get better within 10 days. Get help right away if:  You have a very bad headache.  You cannot stop throwing up (vomiting).  You have very bad pain or swelling around your face or eyes.  You have trouble seeing.  You feel confused.  Your neck is stiff.  You have trouble breathing Summary  Sinusitis is swelling of your sinuses. Sinuses are hollow spaces in the bones around your face.  This condition is caused by tissues in your nose that become inflamed or swollen. This traps germs. These can lead to infection.  If you were prescribed an antibiotic medicine, take it as  told by your doctor. Do not stop taking it even if you start to feel better.  Keep all follow-up visits as told by your doctor. This is important. +++++++++++++++++++++++++++++++++++++++++++++++++++++  Recommend Adult Low Dose Aspirin or  coated  Aspirin 81 mg daily  To reduce risk of Colon Cancer 20 %,  Skin Cancer 26 % ,  Melanoma 46%  and  Pancreatic cancer 60% +++++++++++++++++++++++++ Vitamin D goal  is between 70-100.  Please make sure that you are taking your Vitamin D as directed.  It is very important as a natural anti-inflammatory  helping hair, skin, and nails, as well as reducing stroke and heart attack risk.  It helps your bones and helps with mood. It also decreases numerous cancer risks so please take it as directed.  Low Vit D is associated with a  200-300% higher risk for CANCER  and 200-300% higher risk for HEART   ATTACK  &  STROKE.   .....................................Marland Kitchen It is also associated with higher death rate at younger ages,  autoimmune diseases like Rheumatoid arthritis, Lupus, Multiple Sclerosis.    Also many other serious conditions, like depression, Alzheimer's Dementia, infertility, muscle aches, fatigue, fibromyalgia - just to name a few. ++++++++++++++++++++ Recommend the book "The END of DIETING" by Dr Monico Hoar  & the book "The END of DIABETES " by Dr Monico Hoar At [email or web address] - get book & Audio CD's    Being diabetic has a  300% increased risk for heart attack, stroke, cancer, and alzheimer- type vascular dementia. It is very important that you work harder with diet by avoiding all foods that are white. Avoid white rice (brown & wild rice is OK), white potatoes (sweetpotatoes in moderation is OK), White bread or wheat bread or anything made out of white flour like bagels, donuts, rolls, buns, biscuits, cakes, pastries, cookies, pizza crust, and pasta (made from white flour & egg whites) - vegetarian pasta or spinach or wheat pasta is OK.  Multigrain breads like Arnold's or Pepper[employer], or multigrain sandwich thins or flatbreads.  Diet, exercise and weight loss can reverse and cure diabetes in the early stages.  Diet, exercise and weight loss is very important in the control and prevention of complications of diabetes which affects every system in your body, ie. Brain - dementia/stroke, eyes - glaucoma/blindness, heart - heart attack/heart failure, kidneys - dialysis, stomach - gastric paralysis, intestines - malabsorption, nerves - severe painful neuritis, circulation - gangrene & loss of a leg(s), and finally cancer and Alzheimers.    I recommend avoid fried & greasy foods,  sweets/candy, white rice (brown or wild rice or Quinoa is OK), white potatoes (sweet potatoes are OK) - anything made from white flour - bagels, doughnuts, rolls, buns, biscuits,white and wheat breads, pizza crust and traditional pasta made of white flour & egg white(vegetarian pasta or spinach or wheat pasta is OK).  Multi-grain bread is OK - like multi-grain flat bread or sandwich thins. Avoid alcohol in excess. Exercise is also important.    Eat all the vegetables you want - avoid meat, especially red meat and dairy - especially cheese.  Cheese is the most concentrated form of trans-fats which is the worst thing to clog up our arteries. Veggie cheese is OK which can be found in the fresh produce section at [employer] or [employer] or [employer]  +++++++++++++++++++++ DASH Eating Plan  DASH stands for "Dietary Approaches to Stop Hypertension."   The DASH eating plan is a healthy eating plan that has been shown to reduce high blood pressure (hypertension). Additional health benefits may include reducing the risk of type 2 diabetes mellitus, heart disease, and stroke. The DASH eating plan may also help with weight loss. WHAT DO I NEED TO KNOW ABOUT THE DASH EATING PLAN? For the DASH eating plan, you will follow these general guidelines:  Choose foods with a  percent daily value for sodium of less than 5% (as listed on the food label).  Use salt-free seasonings or herbs instead of table salt or sea salt.  Check with your health care provider or pharmacist before using salt substitutes.  Eat lower-sodium products, often labeled as "lower sodium" or "no salt added."  Eat fresh foods.  Eat more vegetables, fruits, and low-fat dairy products.  Choose whole grains. Look for the word "whole" as the first  word in the ingredient list.  Choose fish   Limit sweets, desserts, sugars, and sugary drinks.  Choose heart-healthy fats.  Eat veggie cheese   Eat more home-cooked food and less restaurant, buffet, and fast food.  Limit fried foods.  Cook foods using methods other than frying.  Limit canned vegetables. If you do use them, rinse them well to decrease the sodium.  When eating at a restaurant, ask that your food be prepared with less salt, or no salt if possible.                      WHAT FOODS CAN I EAT? Read Dr Francis Dowse Fuhrman's books on The End of Dieting & The End of Diabetes  Grains Whole grain or whole wheat bread. Brown rice. Whole grain or whole wheat pasta. Quinoa, bulgur, and whole grain cereals. Low-sodium cereals. Corn or whole wheat flour tortillas. Whole grain cornbread. Whole grain crackers. Low-sodium crackers.  Vegetables Fresh or frozen vegetables (raw, steamed, roasted, or grilled). Low-sodium or reduced-sodium tomato and vegetable juices. Low-sodium or reduced-sodium tomato sauce and paste. Low-sodium or reduced-sodium canned vegetables.   Fruits All fresh, canned (in natural juice), or frozen fruits.  Protein Products  All fish and seafood.  Dried beans, peas, or lentils. Unsalted nuts and seeds. Unsalted canned beans.  Dairy Low-fat dairy products, such as skim or 1% milk, 2% or reduced-fat cheeses, low-fat ricotta or cottage cheese, or plain low-fat yogurt. Low-sodium or reduced-sodium cheeses.  Fats and  Oils Tub margarines without trans fats. Light or reduced-fat mayonnaise and salad dressings (reduced sodium). Avocado. Safflower, olive, or canola oils. Natural peanut or almond butter.  Other Unsalted popcorn and pretzels. The items listed above may not be a complete list of recommended foods or beverages. Contact your dietitian for more options.  +++++++++++++++  WHAT FOODS ARE NOT RECOMMENDED? Grains/ White flour or wheat flour White bread. White pasta. White rice. Refined cornbread. Bagels and croissants. Crackers that contain trans fat.  Vegetables  Creamed or fried vegetables. Vegetables in a . Regular canned vegetables. Regular canned tomato sauce and paste. Regular tomato and vegetable juices.  Fruits Dried fruits. Canned fruit in light or heavy syrup. Fruit juice.  Meat and Other Protein Products Meat in general - RED meat & White meat.  Fatty cuts of meat. Ribs, chicken wings, all processed meats as bacon, sausage, bologna, salami, fatback, hot dogs, bratwurst and packaged luncheon meats.  Dairy Whole or 2% milk, cream, half-and-half, and cream cheese. Whole-fat or sweetened yogurt. Full-fat cheeses or blue cheese. Non-dairy creamers and whipped toppings. Processed cheese, cheese spreads, or cheese curds.  Condiments Onion and garlic salt, seasoned salt, table salt, and sea salt. Canned and packaged gravies. Worcestershire sauce. Tartar sauce. Barbecue sauce. Teriyaki sauce. Soy sauce, including reduced sodium. Steak sauce. Fish sauce. Oyster sauce. Cocktail sauce. Horseradish. Ketchup and mustard. Meat flavorings and tenderizers. Bouillon cubes. Hot sauce. Tabasco sauce. Marinades. Taco seasonings. Relishes.  Fats and Oils Butter, stick margarine, lard, shortening and bacon fat. Coconut, palm kernel, or palm oils. Regular salad dressings.  Pickles and olives. Salted popcorn and pretzels.  The items listed above may not be a complete list of foods and beverages to  avoid.

## 2018-05-26 NOTE — Progress Notes (Signed)
  Subjective:    Patient ID: Sara Brewer, female    DOB: [birth year], 33 y.o.   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 No fever, chill, rigor, sweats, rash or dyspnea.   Medication Sig  . ADDERALL 30 MG tablet Take 1 tablet by mouth daily.  Marland Kitchen NUVARING Insert vaginally 3 consecutive weeks, then remove 1 week.  . MELOXICAM  Take 1 x/dailyu as needed   . Multiple Vitamin  tablet Take 1 tablet daily.   Past Medical History:  Diagnosis Date  . ADD (attention deficit disorder with hyperactivity)   . Anemia    iron  . Anxiety   . Back pain   . GERD (gastroesophageal reflux disease)   . Graves disease 2014   Dr. Evlyn Kanner   . Pelvic pain in female   . Vitamin B12 deficiency   . Vitamin D deficiency     Review of Systems  10 point systems review negative except as above.    Objective:   Physical Exam BP 102/78   Pulse 76   Temp (!) 97.3 F (36.3 C)   Resp 16   Ht 5\' 5"  (1.651 m)   Wt 156 lb (70.8 kg)   BMI 25.96 kg/m   In No Distress. No stridor. Speech nasal &  hoarse. Cough dry.   HEENT - Eac's patent. Lt TM's Nl color / dull, sl retracted. Rt TM dull / pink. EOM's full. PERRLA.  NasoOroPharynx clear. Fronto-maxillary sinuses - sl tender Neck - supple. Nl Thyroid. Chest - Clear equal BS w/o rales, rhonchi, wheezes. Cor - Nl HS. RRR w/o sig m.  MS- FROM w/o deformities. Muscle power, tone and bulk Nl. Gait Nl. Neuro - No obvious Cr N abnormalities. Nl w/o focal abnormalities. Skin - exposed clear w/o rash cyanosis or icterus.    Assessment & Plan:   1.  acute otitis media of right ear  - predniSONE (DELTASONE) 20 MG tablet; 1 tab 3 x day for 3 days, then 1 tab 2 x day for 3 days, then 1 tab 1 x day for 5 days  Dispense: 20 tablet  - azithromycin (ZITHROMAX) 250 MG  tablet; Take 2 tablets (500 mg) on  Day 1,  followed by 1 tablet (250 mg) once daily on Days 2 through 5.  Dispense: 6 each; Refill: 2  2. Subacute pansinusitis  - azithromycin (ZITHROMAX) 250 MG tablet; Take 2 tablets (500 mg) on  Day 1,  followed by 1 tablet (250 mg) once daily on Days 2 through 5.  Dispense: 6 each; Refill: 2  3. Acute rhinitis  - predniSONE (DELTASONE) 20 MG tablet; 1 tab 3 x day for 3 days, then 1 tab 2 x day for 3 days, then 1 tab 1 x day for 5 days  Dispense: 20 tablet  4. Laryngitis  - predniSONE (DELTASONE) 20 MG tablet; 1 tab 3 x day for 3 days, then 1 tab 2 x day for 3 days, then 1 tab 1 x day for 5 days  Dispense: 20 tablet  - discussed meds / SE's, prudent diet and activity concerns and return or ER precautions

## 2018-05-27 ENCOUNTER — Other Ambulatory Visit: Payer: Self-pay | Admitting: Internal Medicine

## 2018-05-27 MED ORDER — FLUCONAZOLE 150 MG PO TABS: ORAL_TABLET | ORAL | 1 refills | Status: DC

## 2018-06-19 ENCOUNTER — Telehealth: Payer: Self-pay | Admitting: Physician Assistant

## 2018-06-19 NOTE — Telephone Encounter (Signed)
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 She got a call from her boss last night around 7:00 p.m. stating that she had been exposed to the Coronavirus. That one of the players on her flight had tested positive and that she should contact her Dr.'s office for instructions. She denies having any symptoms at this time. I told her to stay at home if at all possible and that she would receive a call from either you or the nurse with your recommendations.  Irving Burton.

## 2018-06-19 NOTE — Telephone Encounter (Signed)
Patient has history of becoming in contact with some that was positive. 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  She has a dry cough but states she has had for several weeks, no fever but she does not own a thermometer.   She is on 2 week quarantine from work, I have suggest the same thing, she does not wish for testing at this time but if she develops worsening cough, fever, or SOB we will put in an order for testing. She will call Monday.   Email is [email or web address].

## 2018-06-23 ENCOUNTER — Telehealth: Payer: Managed Care, Other (non HMO) | Admitting: Physician Assistant

## 2018-06-23 DIAGNOSIS — R6889 Other general symptoms and signs: Secondary | ICD-10-CM

## 2018-06-23 DIAGNOSIS — Z20822 Contact with and (suspected) exposure to covid-19: Secondary | ICD-10-CM

## 2018-06-23 MED ORDER — BENZONATATE 200 MG PO CAPS: 200.0000 mg | ORAL_CAPSULE | Freq: Three times a day (TID) | ORAL | 0 refills | Status: DC | PRN

## 2018-06-23 MED ORDER — PROMETHAZINE-DM 6.25-15 MG/5ML PO SYRP: 5.0000 mL | ORAL_SOLUTION | Freq: Four times a day (QID) | ORAL | 1 refills | Status: DC | PRN

## 2018-06-23 MED ORDER — ACETAMINOPHEN 500 MG PO TABS: 1000.0000 mg | ORAL_TABLET | Freq: Four times a day (QID) | ORAL | 2 refills | Status: AC | PRN

## 2018-06-23 MED ORDER — ALBUTEROL SULFATE HFA 108 (90 BASE) MCG/ACT IN AERS: 2.0000 | INHALATION_SPRAY | RESPIRATORY_TRACT | 0 refills | Status: DC | PRN

## 2018-06-23 NOTE — Telephone Encounter (Signed)
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 She states she was told by her company that 8 people thus far that she has flown with have tested positive for COVID19.   She returned from work March 14th, that Sunday she had a severe headache that she has not had previously. She has been taking tylenol since that time. She has had a mild cough due to allergies/sinuses since 05/26/2018 office visit however she states on 06/18/2018 her cough got worse, persistent, waking her up at night, and she is unable to take a deep breath with shortness of breath.  She has yet to have a fever but has been on tylenol consistently due to a headache.   We will send in tylenol, cough medication and an albuterol inhaler, she will continue self quarantine at this time.   She has been on 2 weeks quarantine from her company but states she will have to go back to work April 1st.   25 minutes or more of counseling, chart review, and critical decision making was performed Patient has my number, we will be in contact repeatedly

## 2018-06-24 ENCOUNTER — Other Ambulatory Visit: Payer: Self-pay | Admitting: Physician Assistant

## 2018-06-24 MED ORDER — PROMETHAZINE HCL 25 MG PO TABS: 25.0000 mg | ORAL_TABLET | Freq: Four times a day (QID) | ORAL | 0 refills | Status: DC | PRN

## 2018-06-24 MED ORDER — PROMETHAZINE-CODEINE 6.25-10 MG/5ML PO SYRP: 5.0000 mL | ORAL_SOLUTION | Freq: Four times a day (QID) | ORAL | 0 refills | Status: DC | PRN

## 2018-06-30 ENCOUNTER — Encounter: Payer: Self-pay | Admitting: Physician Assistant

## 2018-07-07 ENCOUNTER — Telehealth: Payer: Managed Care, Other (non HMO) | Admitting: Physician Assistant

## 2018-07-07 DIAGNOSIS — R05 Cough: Secondary | ICD-10-CM | POA: Diagnosis not present

## 2018-07-07 DIAGNOSIS — R059 Cough, unspecified: Secondary | ICD-10-CM

## 2018-07-07 MED ORDER — TRAMADOL HCL 50 MG PO TABS: 50.0000 mg | ORAL_TABLET | Freq: Four times a day (QID) | ORAL | 0 refills | Status: DC | PRN

## 2018-07-07 MED ORDER — DOXYCYCLINE HYCLATE 100 MG PO CAPS
ORAL_CAPSULE | ORAL | 0 refills | Status: DC
Start: 2018-07-07 — End: 2018-10-02

## 2018-07-07 MED ORDER — TRAMADOL HCL 50 MG PO TABS: ORAL_TABLET | ORAL | 0 refills | Status: DC

## 2018-07-07 NOTE — Telephone Encounter (Signed)
THIS ENCOUNTER IS A VIRTUAL VISIT DUE TO COVID-19 - PATIENT WAS NOT SEEN IN THE OFFICE.  PATIENT HAS CONSENTED TO VIRTUAL VISIT / TELEMEDICINE VISIT   Virtual Visit via telephone Note  I connected with Sara Brewer on 07/07/18  by telephone.  I verified that I am speaking with the correct person using two identifiers.    I discussed the limitations of evaluation and management by telemedicine and the availability of in person appointments. The patient expressed understanding and agreed to proceed.  History of Present Illness: She states she has continuing cough, feels she has chest pain WITH cough. She is on codeine cough syrup at night but still coughing. She had a fever Saturday night, 100.9. She is still on tyelnol 1-2 x a day.   Medications  Current Outpatient Medications (Endocrine & Metabolic):  .  etonogestrel-ethinyl estradiol (NUVARING) 0.12-0.015 MG/24HR vaginal ring, Insert vaginally and leave in place for 3 consecutive weeks, then remove for 1 week. .  predniSONE (DELTASONE) 20 MG tablet, 1 tab 3 x day for 3 days, then 1 tab 2 x day for 3 days, then 1 tab 1 x day for 5 days   Current Outpatient Medications (Respiratory):  .  albuterol (VENTOLIN HFA) 108 (90 Base) MCG/ACT inhaler, Inhale 2 puffs into the lungs every 4 (four) hours as needed for wheezing or shortness of breath. .  benzonatate (TESSALON) 200 MG capsule, Take 1 capsule (200 mg total) by mouth 3 (three) times daily as needed for cough (Max: 600mg  per day). .  promethazine (PHENERGAN) 25 MG tablet, Take 1 tablet (25 mg total) by mouth every 6 (six) hours as needed for nausea or vomiting (can cause fatigue). Max: 4 tablets per day .  promethazine-codeine (PHENERGAN WITH CODEINE) 6.25-10 MG/5ML syrup, Take 5 mLs by mouth every 6 (six) hours as needed for cough. Max: 46mL per day .  promethazine-dextromethorphan (PROMETHAZINE-DM) 6.25-15 MG/5ML syrup, Take 5 mLs by mouth 4 (four) times daily as needed for  cough.  Current Outpatient Medications (Analgesics):  .  acetaminophen (TYLENOL) 500 MG tablet, Take 2 tablets (1,000 mg total) by mouth every 6 (six) hours as needed. .  MELOXICAM PO, Take by mouth. .  traMADol (ULTRAM) 50 MG tablet, 1/2-1 tablet for cough as needed every 6 hours   Current Outpatient Medications (Other):  .  amphetamine-dextroamphetamine (ADDERALL) 30 MG tablet, Take 1 tablet by mouth daily. Marland Kitchen  azithromycin (ZITHROMAX) 250 MG tablet, Take 2 tablets (500 mg) on  Day 1,  followed by 1 tablet (250 mg) once daily on Days 2 through 5. .  doxycycline (VIBRAMYCIN) 100 MG capsule, Take 1 capsule twice daily with food .  fluconazole (DIFLUCAN) 150 MG tablet, Take 1 tablet 2 x /week if needed for yeast infection .  Multiple Vitamin (MULTIVITAMIN) tablet, Take 1 tablet by mouth daily.  Problem list She has Anxiety state; Graves disease; GERD (gastroesophageal reflux disease); ADHD (attention deficit hyperactivity disorder); Anemia; Vitamin B12 deficiency; and Vitamin D deficiency on their problem list.   Observations/Objective: General Appearance:Well sounding, in no apparent distress.  ENT/Mouth: No hoarseness, dry sounding cough multiple times for duration of visit.  Respiratory: completing full sentences without distress, without audible wheeze Neuro: Awake and oriented X 3,  Psych:  Insight and Judgment appropriate.   Assessment and Plan:  Cough Patient well over 2 weeks since likely covid 19 infection Still with cough/chest pressure, does not sound short of breath Will do ABX and tramadol for cough Continue to follow  up in the office.  -     doxycycline (VIBRAMYCIN) 100 MG capsule; Take 1 capsule twice daily with food -     Discontinue: traMADol (ULTRAM) 50 MG tablet; Take 1 tablet (50 mg total) by mouth every 6 (six) hours as needed for up to 5 days (1/-2 pill for cough). 1/2-1 tablet for cough as needed every -     traMADol (ULTRAM) 50 MG tablet; 1/2-1 tablet for cough  as needed every 6 hours     Follow Up Instructions:  I discussed the assessment and treatment plan with the patient. The patient was provided an opportunity to ask questions and all were answered. The patient agreed with the plan and demonstrated an understanding of the instructions.   The patient was advised to call back or seek an in-person evaluation if the symptoms worsen or if the condition fails to improve as anticipated.  I provided 15 minutes of non-face-to-face time during this encounter.   Quentin Mulling, PA-C

## 2018-07-10 DIAGNOSIS — Z6826 Body mass index (BMI) 26.0-26.9, adult: Secondary | ICD-10-CM | POA: Diagnosis not present

## 2018-07-10 DIAGNOSIS — Z01419 Encounter for gynecological examination (general) (routine) without abnormal findings: Secondary | ICD-10-CM | POA: Diagnosis not present

## 2018-07-14 ENCOUNTER — Encounter: Payer: Self-pay | Admitting: Physician Assistant

## 2018-07-22 ENCOUNTER — Other Ambulatory Visit: Payer: Self-pay

## 2018-07-22 DIAGNOSIS — Z30011 Encounter for initial prescription of contraceptive pills: Secondary | ICD-10-CM

## 2018-07-22 DIAGNOSIS — R87619 Unspecified abnormal cytological findings in specimens from cervix uteri: Secondary | ICD-10-CM | POA: Diagnosis not present

## 2018-07-22 MED ORDER — ETONOGESTREL-ETHINYL ESTRADIOL 0.12-0.015 MG/24HR VA RING
VAGINAL_RING | VAGINAL | 1 refills | Status: DC
Start: 2018-07-22 — End: 2019-02-25

## 2018-09-22 ENCOUNTER — Other Ambulatory Visit: Payer: Self-pay | Admitting: Internal Medicine

## 2018-09-23 ENCOUNTER — Other Ambulatory Visit: Payer: Self-pay | Admitting: Physician Assistant

## 2018-09-23 DIAGNOSIS — F988 Other specified behavioral and emotional disorders with onset usually occurring in childhood and adolescence: Secondary | ICD-10-CM

## 2018-09-23 MED ORDER — AMPHETAMINE-DEXTROAMPHETAMINE 30 MG PO TABS: 30.0000 mg | ORAL_TABLET | Freq: Every day | ORAL | 0 refills | Status: DC

## 2018-09-23 NOTE — Progress Notes (Signed)
Future Appointments  Date Time Provider [place]  02/09/2019 10:00 AM Vicie Mutters, PA-C [id number] None

## 2018-10-01 NOTE — Progress Notes (Signed)
Assessment and Plan:  Syliva was seen today for dysuria.  Diagnoses and all orders for this visit:  Dysuria/ Urgency of urination Medications: keflex 500 mg BID. Maintain adequate hydration. Follow up if symptoms not improving, and as needed. -     Urinalysis w microscopic + reflex cultur -     cephALEXin (KEFLEX) 500 MG capsule; Take 1 capsule 2 x/day after meals for 7 days.  Graves disease -     TSH  Anxiety state Start new medication as prescribed; after discussion proceed with buspar due to hx of poor effect with SSRI and needing benefit by next week  Stress management techniques discussed, increase water, good sleep hygiene discussed, increase exercise, and increase veggies.  Follow up 1-2 month, call the office if any new AE's from medications and we will switch them -     ALPRAZolam (XANAX) 0.5 MG tablet; Take 1/2-1 tab once or twice a day if needed only for panic attacks. Limit to <5 days per week to avoid tolerance. -     busPIRone (BUSPAR) 10 MG tablet; Start by taking 1/2 tab twice a day; increase dose gradually every 3-4 days up to full tab three times daily for anxiety.   Further disposition pending results of labs. Discussed med's effects and SE's.   Over 30 minutes of exam, counseling, chart review, and critical decision making was performed.   Future Appointments  Date Time Provider [place]  11/13/2018 11:15 AM [name], NP [facility] None  02/09/2019 10:00 AM Vicie Mutters, PA-C [facility] None    ------------------------------------------------------------------------------------------------------------------   HPI BP 132/86   Pulse 84   Temp 97.7 F (36.5 C)   Ht 5\' 5"  (1.651 m)   Wt 161 lb (73 kg)   SpO2 98%   BMI 26.79 kg/m   33 y.o.female presents for evaluation of anxiety and UTI symptoms; she reports began with dysuria and frequency that began 3 days ago, urine has odor, denies cloudiness or hematuria.   She is sexually active,  no new partners. No hx of STIs, no new vaginal discharge or perineal burning, lesions or rash. Denies fever/chills, pelvic pain, nausea, abdominal pain.   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 She reports she tried zoloft and lexapro with last flare 5 years ago, didn't feel that this helped. She has taken xanax previously but can't take during the day due to her [employer] job. She will start flying again next week and feels very anxious regarding this. Denies anxiety r/t other aspects of her life.   She has ADD and has been well controlled on adderall 30 mg daily PRN; she is aware this can exacerbate anxiety, has been minimizing use and takes 1/2 tab only when needed.   She is monitored for hx of Graves disease:  Lab Results  Component Value Date   TSH 0.50 01/28/2018  .    Past Medical History:  Diagnosis Date  . ADD (attention deficit disorder with hyperactivity)   . Anemia    iron  . Anxiety   . Back pain   . GERD (gastroesophageal reflux disease)   . Graves disease 2014   Dr. Forde Dandy   . Pelvic pain in female   . Vitamin B12 deficiency   . Vitamin D deficiency      Allergies  Allergen Reactions  . Codeine  Nausea And Vomiting  . Milk-Related Compounds Hives and Nausea And Vomiting  . Sulfonamide Derivatives Nausea And Vomiting  . Penicillins Nausea And Vomiting    PER PATIENT just nausea and vomiting    Current Outpatient Medications on File Prior to Visit  Medication Sig  . acetaminophen (TYLENOL) 500 MG tablet Take 2 tablets (1,000 mg total) by mouth every 6 (six) hours as needed.  Marland Kitchen. albuterol (VENTOLIN HFA) 108 (90 Base) MCG/ACT inhaler Inhale 2 puffs into the lungs every 4 (four) hours as needed for wheezing or shortness of breath.  . amphetamine-dextroamphetamine (ADDERALL) 30 MG tablet Take 1 tablet by mouth daily.  Marland Kitchen.  etonogestrel-ethinyl estradiol (NUVARING) 0.12-0.015 MG/24HR vaginal ring Insert vaginally and leave in place for 3 consecutive weeks, then remove for 1 week.  . MELOXICAM PO Take by mouth as needed.   . Multiple Vitamin (MULTIVITAMIN) tablet Take 1 tablet by mouth daily.   No current facility-administered medications on file prior to visit.     ROS: all negative except above.   Physical Exam:  BP 132/86   Pulse 84   Temp 97.7 F (36.5 C)   Ht 5\' 5"  (1.651 m)   Wt 161 lb (73 kg)   SpO2 98%   BMI 26.79 kg/m   General Appearance: Well nourished, in no apparent distress. Eyes: PERRLA, EOMs, conjunctiva no swelling or erythema ENT/Mouth: No erythema, swelling, or exudate on post pharynx.  Tonsils not swollen or erythematous. Hearing normal.  Neck: Supple, thyroid normal.  Respiratory: Respiratory effort normal, BS equal bilaterally without rales, rhonchi, wheezing or stridor.  Cardio: RRR with no MRGs. Brisk peripheral pulses without edema.  Abdomen: Soft, + BS.  Non tender, no guarding, rebound, hernias, masses. Lymphatics: Non tender without lymphadenopathy.  Musculoskeletal: Full ROM, symmetrical strength, normal gait.  Skin: Warm, dry without rashes, lesions, ecchymosis.  Neuro: Cranial nerves intact. Normal muscle tone, no cerebellar symptoms. Sensation intact.  Psych: Awake and oriented X 3, anxious affect, Insight and Judgment appropriate.     Dan MakerAshley C Keyston Ardolino, NP 11:51 AM [facility]

## 2018-10-02 ENCOUNTER — Encounter: Payer: Self-pay | Admitting: Adult Health

## 2018-10-02 ENCOUNTER — Ambulatory Visit: Payer: Managed Care, Other (non HMO) | Admitting: Adult Health

## 2018-10-02 ENCOUNTER — Other Ambulatory Visit: Payer: Self-pay

## 2018-10-02 VITALS — BP 132/86 | HR 84 | Temp 97.7°F | Ht 65.0 in | Wt 161.0 lb

## 2018-10-02 DIAGNOSIS — F411 Generalized anxiety disorder: Secondary | ICD-10-CM | POA: Diagnosis not present

## 2018-10-02 DIAGNOSIS — E0500 Thyrotoxicosis with diffuse goiter without thyrotoxic crisis or storm: Secondary | ICD-10-CM | POA: Diagnosis not present

## 2018-10-02 DIAGNOSIS — R3915 Urgency of urination: Secondary | ICD-10-CM | POA: Diagnosis not present

## 2018-10-02 DIAGNOSIS — R300 Dysuria: Secondary | ICD-10-CM

## 2018-10-02 MED ORDER — ALPRAZOLAM 0.5 MG PO TABS: ORAL_TABLET | ORAL | 0 refills | Status: DC

## 2018-10-02 MED ORDER — CEPHALEXIN 500 MG PO CAPS: ORAL_CAPSULE | ORAL | 0 refills | Status: DC

## 2018-10-02 MED ORDER — BUSPIRONE HCL 10 MG PO TABS: ORAL_TABLET | ORAL | 2 refills | Status: DC

## 2018-10-02 NOTE — Patient Instructions (Signed)
Use xanax sparingly, ONLY when needed for panic attacks, try to limit to <5 days / week due to addictive potential. Don't take with alcohol or other sedating medications. Try to limit use for sleep -   Start buspar for anxiety - start with low dose 1/2 tab twice daily then slowly increase dose every 3 days or so until anxiety is improving      NEW GUIDELINES FOR BENOZOS  New guidelines suggest the benzodiazepines are best short term, with prolonged use they lead to physical and psychological dependence. In addition, evidence suggest that for insomnia the effectiveness wanes in 4 weeks and the risks out weight their benefits. Use of these agents have been associated with dementia, falls, motor vehicle accidents and physical addiction. Decreasing these medication have been proven to show improvements in cognition, alertness, decrease of falls and daytime sedation.     Buspirone tablets What is this medicine? BUSPIRONE (byoo SPYE rone) is used to treat anxiety disorders. This medicine may be used for other purposes; ask your health care provider or pharmacist if you have questions. COMMON BRAND NAME(S): BuSpar What should I tell my health care provider before I take this medicine? They need to know if you have any of these conditions:  kidney or liver disease  an unusual or allergic reaction to buspirone, other medicines, foods, dyes, or preservatives  pregnant or trying to get pregnant  breast-feeding How should I use this medicine? Take this medicine by mouth with a glass of water. Follow the directions on the prescription label. You may take this medicine with or without food. To ensure that this medicine always works the same way for you, you should take it either always with or always without food. Take your doses at regular intervals. Do not take your medicine more often than directed. Do not stop taking except on the advice of your doctor or health care professional. Talk to your  pediatrician regarding the use of this medicine in children. Special care may be needed. Overdosage: If you think you have taken too much of this medicine contact a poison control center or emergency room at once. NOTE: This medicine is only for you. Do not share this medicine with others. What if I miss a dose? If you miss a dose, take it as soon as you can. If it is almost time for your next dose, take only that dose. Do not take double or extra doses. What may interact with this medicine? Do not take this medicine with any of the following medications:  linezolid  MAOIs like Carbex, Eldepryl, Marplan, Nardil, and Parnate  methylene blue  procarbazine This medicine may also interact with the following medications:  diazepam  digoxin  diltiazem  erythromycin  grapefruit juice  haloperidol  medicines for mental depression or mood problems  medicines for seizures like carbamazepine, phenobarbital and phenytoin  nefazodone  other medications for anxiety  rifampin  ritonavir  some antifungal medicines like itraconazole, ketoconazole, and voriconazole  verapamil  warfarin This list may not describe all possible interactions. Give your health care provider a list of all the medicines, herbs, non-prescription drugs, or dietary supplements you use. Also tell them if you smoke, drink alcohol, or use illegal drugs. Some items may interact with your medicine. What should I watch for while using this medicine? Visit your doctor or health care professional for regular checks on your progress. It may take 1 to 2 weeks before your anxiety gets better. You may get drowsy or  dizzy. Do not drive, use machinery, or do anything that needs mental alertness until you know how this drug affects you. Do not stand or sit up quickly, especially if you are an older patient. This reduces the risk of dizzy or fainting spells. Alcohol can make you more drowsy and dizzy. Avoid alcoholic  drinks. What side effects may I notice from receiving this medicine? Side effects that you should report to your doctor or health care professional as soon as possible:  blurred vision or other vision changes  chest pain  confusion  difficulty breathing  feelings of hostility or anger  muscle aches and pains  numbness or tingling in hands or feet  ringing in the ears  skin rash and itching  vomiting  weakness Side effects that usually do not require medical attention (report to your doctor or health care professional if they continue or are bothersome):  disturbed dreams, nightmares  headache  nausea  restlessness or nervousness  sore throat and nasal congestion  stomach upset This list may not describe all possible side effects. Call your doctor for medical advice about side effects. You may report side effects to FDA at 1-800-FDA-1088. Where should I keep my medicine? Keep out of the reach of children. Store at room temperature below 30 degrees C (86 degrees F). Protect from light. Keep container tightly closed. Throw away any unused medicine after the expiration date. NOTE: This sheet is a summary. It may not cover all possible information. If you have questions about this medicine, talk to your doctor, pharmacist, or health care provider.  2020 Elsevier/Gold Standard (2009-10-27 18:06:11)      Alprazolam tablets What is this medicine? ALPRAZOLAM (al PRAY zoe lam) is a benzodiazepine. It is used to treat anxiety and panic attacks. This medicine may be used for other purposes; ask your health care provider or pharmacist if you have questions. COMMON BRAND NAME(S): Xanax What should I tell my health care provider before I take this medicine? They need to know if you have any of these conditions:  an alcohol or drug abuse problem  bipolar disorder, depression, psychosis or other mental health conditions  glaucoma  kidney or liver disease  lung or  breathing disease  myasthenia gravis  Parkinson's disease  porphyria  seizures or a history of seizures  suicidal thoughts  an unusual or allergic reaction to alprazolam, other benzodiazepines, foods, dyes, or preservatives  pregnant or trying to get pregnant  breast-feeding How should I use this medicine? Take this medicine by mouth with a glass of water. Follow the directions on the prescription label. Take your medicine at regular intervals. Do not take it more often than directed. Do not stop taking except on your doctor's advice. A special MedGuide will be given to you by the pharmacist with each prescription and refill. Be sure to read this information carefully each time. Talk to your pediatrician regarding the use of this medicine in children. Special care may be needed. Overdosage: If you think you have taken too much of this medicine contact a poison control center or emergency room at once. NOTE: This medicine is only for you. Do not share this medicine with others. What if I miss a dose? If you miss a dose, take it as soon as you can. If it is almost time for your next dose, take only that dose. Do not take double or extra doses. What may interact with this medicine? Do not take this medicine with any of the  following medications:  certain antiviral medicines for HIV or AIDS like delavirdine, indinavir  certain medicines for fungal infections like ketoconazole and itraconazole  narcotic medicines for cough  sodium oxybate This medicine may also interact with the following medications:  alcohol  antihistamines for allergy, cough and cold  certain antibiotics like clarithromycin, erythromycin, isoniazid, rifampin, rifapentine, rifabutin, and troleandomycin  certain medicines for blood pressure, heart disease, irregular heart beat  certain medicines for depression, like amitriptyline, fluoxetine, sertraline  certain medicines for seizures like carbamazepine,  oxcarbazepine, phenobarbital, phenytoin, primidone  cimetidine  cyclosporine  female hormones, like estrogens or progestins and birth control pills, patches, rings, or injections  general anesthetics like halothane, isoflurane, methoxyflurane, propofol  grapefruit juice  local anesthetics like lidocaine, pramoxine, tetracaine  medicines that relax muscles for surgery  narcotic medicines for pain  other antiviral medicines for HIV or AIDS  phenothiazines like chlorpromazine, mesoridazine, prochlorperazine, thioridazine This list may not describe all possible interactions. Give your health care provider a list of all the medicines, herbs, non-prescription drugs, or dietary supplements you use. Also tell them if you smoke, drink alcohol, or use illegal drugs. Some items may interact with your medicine. What should I watch for while using this medicine? Tell your doctor or health care professional if your symptoms do not start to get better or if they get worse. Do not stop taking except on your doctor's advice. You may develop a severe reaction. Your doctor will tell you how much medicine to take. You may get drowsy or dizzy. Do not drive, use machinery, or do anything that needs mental alertness until you know how this medicine affects you. To reduce the risk of dizzy and fainting spells, do not stand or sit up quickly, especially if you are an older patient. Alcohol may increase dizziness and drowsiness. Avoid alcoholic drinks. If you are taking another medicine that also causes drowsiness, you may have more side effects. Give your health care provider a list of all medicines you use. Your doctor will tell you how much medicine to take. Do not take more medicine than directed. Call emergency for help if you have problems breathing or unusual sleepiness. What side effects may I notice from receiving this medicine? Side effects that you should report to your doctor or health care  professional as soon as possible:  allergic reactions like skin rash, itching or hives, swelling of the face, lips, or tongue  breathing problems  confusion  loss of balance or coordination  signs and symptoms of low blood pressure like dizziness; feeling faint or lightheaded, falls; unusually weak or tired  suicidal thoughts or other mood changes Side effects that usually do not require medical attention (report to your doctor or health care professional if they continue or are bothersome):  dizziness  dry mouth  nausea, vomiting  tiredness This list may not describe all possible side effects. Call your doctor for medical advice about side effects. You may report side effects to FDA at 1-800-FDA-1088. Where should I keep my medicine? Keep out of the reach of children. This medicine can be abused. Keep your medicine in a safe place to protect it from theft. Do not share this medicine with anyone. Selling or giving away this medicine is dangerous and against the law. Store at room temperature between 20 and 25 degrees C (68 and 77 degrees F). This medicine may cause accidental overdose and death if taken by other adults, children, or pets. Mix any unused medicine with a  substance like cat litter or coffee grounds. Then throw the medicine away in a sealed container like a sealed bag or a coffee can with a lid. Do not use the medicine after the expiration date. NOTE: This sheet is a summary. It may not cover all possible information. If you have questions about this medicine, talk to your doctor, pharmacist, or health care provider.  2020 Elsevier/Gold Standard (2014-12-16 13:47:25)     Cephalexin tablets or capsules What is this medicine? CEPHALEXIN (sef a LEX in) is a cephalosporin antibiotic. It is used to treat certain kinds of bacterial infections It will not work for colds, flu, or other viral infections. This medicine may be used for other purposes; ask your health care  provider or pharmacist if you have questions. COMMON BRAND NAME(S): Biocef, Daxbia, Keflex, Keftab What should I tell my health care provider before I take this medicine? They need to know if you have any of these conditions:  kidney disease  stomach or intestine problems, especially colitis  an unusual or allergic reaction to cephalexin, other cephalosporins, penicillins, other antibiotics, medicines, foods, dyes or preservatives  pregnant or trying to get pregnant  breast-feeding How should I use this medicine? Take this medicine by mouth with a full glass of water. Follow the directions on the prescription label. This medicine can be taken with or without food. Take your medicine at regular intervals. Do not take your medicine more often than directed. Take all of your medicine as directed even if you think you are better. Do not skip doses or stop your medicine early. Talk to your pediatrician regarding the use of this medicine in children. While this drug may be prescribed for selected conditions, precautions do apply. Overdosage: If you think you have taken too much of this medicine contact a poison control center or emergency room at once. NOTE: This medicine is only for you. Do not share this medicine with others. What if I miss a dose? If you miss a dose, take it as soon as you can. If it is almost time for your next dose, take only that dose. Do not take double or extra doses. There should be at least 4 to 6 hours between doses. What may interact with this medicine?  probenecid  some other antibiotics This list may not describe all possible interactions. Give your health care provider a list of all the medicines, herbs, non-prescription drugs, or dietary supplements you use. Also tell them if you smoke, drink alcohol, or use illegal drugs. Some items may interact with your medicine. What should I watch for while using this medicine? Tell your doctor or health care provider if  your symptoms do not begin to improve in a few days. This medicine may cause serious skin reactions. They can happen weeks to months after starting the medicine. Contact your health care provider right away if you notice fevers or flu-like symptoms with a rash. The rash may be red or purple and then turn into blisters or peeling of the skin. Or, you might notice a red rash with swelling of the face, lips or lymph nodes in your neck or under your arms. Do not treat diarrhea with over the counter products. Contact your doctor if you have diarrhea that lasts more than 2 days or if it is severe and watery. If you have diabetes, you may get a false-positive result for sugar in your urine. Check with your doctor or health care provider. What side effects may I notice  from receiving this medicine? Side effects that you should report to your doctor or health care professional as soon as possible:  allergic reactions like skin rash, itching or hives, swelling of the face, lips, or tongue  breathing problems  pain or trouble passing urine  redness, blistering, peeling or loosening of the skin, including inside the mouth  severe or watery diarrhea  unusually weak or tired  yellowing of the eyes, skin Side effects that usually do not require medical attention (report to your doctor or health care professional if they continue or are bothersome):  gas or heartburn  genital or anal irritation  headache  joint or muscle pain  nausea, vomiting This list may not describe all possible side effects. Call your doctor for medical advice about side effects. You may report side effects to FDA at 1-800-FDA-1088. Where should I keep my medicine? Keep out of the reach of children. Store at room temperature between 59 and 86 degrees F (15 and 30 degrees C). Throw away any unused medicine after the expiration date. NOTE: This sheet is a summary. It may not cover all possible information. If you have questions  about this medicine, talk to your doctor, pharmacist, or health care provider.  2020 Elsevier/Gold Standard (2018-06-27 07:00:28)

## 2018-10-03 LAB — URINALYSIS W MICROSCOPIC + REFLEX CULTURE
Bacteria, UA: NONE SEEN /HPF
Bilirubin Urine: NEGATIVE
Glucose, UA: NEGATIVE
Hgb urine dipstick: NEGATIVE
Hyaline Cast: NONE SEEN /LPF
Leukocyte Esterase: NEGATIVE
Nitrites, Initial: NEGATIVE
Protein, ur: NEGATIVE
RBC / HPF: NONE SEEN /HPF (ref 0–2)
Specific Gravity, Urine: 1.024 (ref 1.001–1.03)
pH: <=5 (ref 5.0–8.0)

## 2018-10-03 LAB — NO CULTURE INDICATED

## 2018-10-03 LAB — TSH: TSH: 0.34 mIU/L — ABNORMAL LOW

## 2018-10-06 NOTE — Progress Notes (Signed)
10/06/2018--PATIENT IS AWARE OF LAB RESULTS. Sara Brewer [facility]

## 2018-11-03 ENCOUNTER — Other Ambulatory Visit: Payer: Self-pay

## 2018-11-03 DIAGNOSIS — F988 Other specified behavioral and emotional disorders with onset usually occurring in childhood and adolescence: Secondary | ICD-10-CM

## 2018-11-03 NOTE — Telephone Encounter (Signed)
Adderall refill request. In que for review. 

## 2018-11-04 MED ORDER — AMPHETAMINE-DEXTROAMPHETAMINE 30 MG PO TABS: 30.0000 mg | ORAL_TABLET | Freq: Every day | ORAL | 0 refills | Status: DC

## 2018-11-13 ENCOUNTER — Ambulatory Visit: Payer: Managed Care, Other (non HMO) | Admitting: Adult Health

## 2018-12-03 ENCOUNTER — Telehealth: Payer: Self-pay | Admitting: Physician Assistant

## 2018-12-03 DIAGNOSIS — F988 Other specified behavioral and emotional disorders with onset usually occurring in childhood and adolescence: Secondary | ICD-10-CM

## 2018-12-03 MED ORDER — AMPHETAMINE-DEXTROAMPHETAMINE 30 MG PO TABS: 30.0000 mg | ORAL_TABLET | Freq: Every day | ORAL | 0 refills | Status: DC

## 2018-12-03 NOTE — Telephone Encounter (Signed)
-----   Message from Elenor Quinones, [place] sent at 12/03/2018 12:18 PM EDT ----- Regarding: med refill Contact: [phone] PER PT/YELLOW NOTE:  Refill on ADDERALL Please & thank you!  Pharmacy:  [facility]

## 2018-12-29 ENCOUNTER — Other Ambulatory Visit: Payer: Self-pay

## 2018-12-29 ENCOUNTER — Encounter: Payer: Self-pay | Admitting: Adult Health

## 2018-12-29 ENCOUNTER — Ambulatory Visit: Payer: Managed Care, Other (non HMO) | Admitting: Adult Health

## 2018-12-29 VITALS — BP 120/80 | HR 102 | Temp 97.9°F | Ht 65.0 in | Wt 158.0 lb

## 2018-12-29 DIAGNOSIS — F988 Other specified behavioral and emotional disorders with onset usually occurring in childhood and adolescence: Secondary | ICD-10-CM | POA: Diagnosis not present

## 2018-12-29 DIAGNOSIS — F411 Generalized anxiety disorder: Secondary | ICD-10-CM | POA: Diagnosis not present

## 2018-12-29 MED ORDER — VENLAFAXINE HCL ER 37.5 MG PO CP24: ORAL_CAPSULE | ORAL | 1 refills | Status: DC

## 2018-12-29 MED ORDER — AMPHETAMINE-DEXTROAMPHETAMINE 30 MG PO TABS: 30.0000 mg | ORAL_TABLET | Freq: Every day | ORAL | 0 refills | Status: DC

## 2018-12-29 NOTE — Patient Instructions (Addendum)
Start with 1 tab daily x 2 weeks, then increase to 2 tabs daily  Call me in 4 weeks - 10/19 or so to let me know how it's going    Venlafaxine extended-release capsules What is this medicine? VENLAFAXINE(VEN la fax een) is used to treat depression, anxiety and panic disorder. This medicine may be used for other purposes; ask your health care provider or pharmacist if you have questions. COMMON BRAND NAME(S): Effexor XR What should I tell my health care provider before I take this medicine? They need to know if you have any of these conditions:  bleeding disorders  glaucoma  heart disease  high blood pressure  high cholesterol  kidney disease  liver disease  low levels of sodium in the blood  mania or bipolar disorder  seizures  suicidal thoughts, plans, or attempt; a previous suicide attempt by you or a family  take medicines that treat or prevent blood clots  thyroid disease  an unusual or allergic reaction to venlafaxine, desvenlafaxine, other medicines, foods, dyes, or preservatives  pregnant or trying to get pregnant  breast-feeding How should I use this medicine? Take this medicine by mouth with a full glass of water. Follow the directions on the prescription label. Do not cut, crush, or chew this medicine. Take it with food. If needed, the capsule may be carefully opened and the entire contents sprinkled on a spoonful of cool applesauce. Swallow the applesauce/pellet mixture right away without chewing and follow with a glass of water to ensure complete swallowing of the pellets. Try to take your medicine at about the same time each day. Do not take your medicine more often than directed. Do not stop taking this medicine suddenly except upon the advice of your doctor. Stopping this medicine too quickly may cause serious side effects or your condition may worsen. A special MedGuide will be given to you by the pharmacist with each prescription and refill. Be sure to  read this information carefully each time. Talk to your pediatrician regarding the use of this medicine in children. Special care may be needed. Overdosage: If you think you have taken too much of this medicine contact a poison control center or emergency room at once. NOTE: This medicine is only for you. Do not share this medicine with others. What if I miss a dose? If you miss a dose, take it as soon as you can. If it is almost time for your next dose, take only that dose. Do not take double or extra doses. What may interact with this medicine? Do not take this medicine with any of the following medications:  certain medicines for fungal infections like fluconazole, itraconazole, ketoconazole, posaconazole, voriconazole  cisapride  desvenlafaxine  dronedarone  duloxetine  levomilnacipran  linezolid  MAOIs like Carbex, Eldepryl, Marplan, Nardil, and Parnate  methylene blue (injected into a vein)  milnacipran  pimozide  thioridazine This medicine may also interact with the following medications:  amphetamines  aspirin and aspirin-like medicines  certain medicines for depression, anxiety, or psychotic disturbances  certain medicines for migraine headaches like almotriptan, eletriptan, frovatriptan, naratriptan, rizatriptan, sumatriptan, zolmitriptan  certain medicines for sleep  certain medicines that treat or prevent blood clots like dalteparin, enoxaparin, warfarin  cimetidine  clozapine  diuretics  fentanyl  furazolidone  indinavir  isoniazid  lithium  metoprolol  NSAIDS, medicines for pain and inflammation, like ibuprofen or naproxen  other medicines that prolong the QT interval (cause an abnormal heart rhythm) like dofetilide, ziprasidone  procarbazine  rasagiline  supplements like St. John's wort, kava kava, valerian  tramadol  tryptophan This list may not describe all possible interactions. Give your health care provider a list of all  the medicines, herbs, non-prescription drugs, or dietary supplements you use. Also tell them if you smoke, drink alcohol, or use illegal drugs. Some items may interact with your medicine. What should I watch for while using this medicine? Tell your doctor if your symptoms do not get better or if they get worse. Visit your doctor or health care professional for regular checks on your progress. Because it may take several weeks to see the full effects of this medicine, it is important to continue your treatment as prescribed by your doctor. Patients and their families should watch out for new or worsening thoughts of suicide or depression. Also watch out for sudden changes in feelings such as feeling anxious, agitated, panicky, irritable, hostile, aggressive, impulsive, severely restless, overly excited and hyperactive, or not being able to sleep. If this happens, especially at the beginning of treatment or after a change in dose, call your health care professional. This medicine can cause an increase in blood pressure. Check with your doctor for instructions on monitoring your blood pressure while taking this medicine. You may get drowsy or dizzy. Do not drive, use machinery, or do anything that needs mental alertness until you know how this medicine affects you. Do not stand or sit up quickly, especially if you are an older patient. This reduces the risk of dizzy or fainting spells. Alcohol may interfere with the effect of this medicine. Avoid alcoholic drinks. Your mouth may get dry. Chewing sugarless gum, sucking hard candy and drinking plenty of water will help. Contact your doctor if the problem does not go away or is severe. What side effects may I notice from receiving this medicine? Side effects that you should report to your doctor or health care professional as soon as possible:  allergic reactions like skin rash, itching or hives, swelling of the face, lips, or tongue  anxious  breathing  problems  confusion  changes in vision  chest pain  confusion  elevated mood, decreased need for sleep, racing thoughts, impulsive behavior  eye pain  fast, irregular heartbeat  feeling faint or lightheaded, falls  feeling agitated, angry, or irritable  hallucination, loss of contact with reality  high blood pressure  loss of balance or coordination  palpitations  redness, blistering, peeling or loosening of the skin, including inside the mouth  restlessness, pacing, inability to keep still  seizures  stiff muscles  suicidal thoughts or other mood changes  trouble passing urine or change in the amount of urine  trouble sleeping  unusual bleeding or bruising  unusually weak or tired  vomiting Side effects that usually do not require medical attention (report to your doctor or health care professional if they continue or are bothersome):  change in sex drive or performance  change in appetite or weight  constipation  dizziness  dry mouth  headache  increased sweating  nausea  tired This list may not describe all possible side effects. Call your doctor for medical advice about side effects. You may report side effects to FDA at 1-800-FDA-1088. Where should I keep my medicine? Keep out of the reach of children. Store at a controlled temperature between 20 and 25 degrees C (68 degrees and 77 degrees F), in a dry place. Throw away any unused medicine after the expiration date. NOTE: This sheet is a  summary. It may not cover all possible information. If you have questions about this medicine, talk to your doctor, pharmacist, or health care provider.  2020 Elsevier/Gold Standard (2018-03-11 12:06:43)

## 2018-12-29 NOTE — Progress Notes (Signed)
Assessment and Plan:  Sara Brewer was seen today for medication management.  Diagnoses and all orders for this visit:  Anxiety state Start new medication as prescribed Stress management techniques discussed, increase water, good sleep hygiene discussed, increase exercise, and increase veggies.  Follow up 1 month, call the office if any new AE's from medications and we will switch them -     venlafaxine XR (EFFEXOR XR) 37.5 MG 24 hr capsule; Start by taking 1 tab daily in the morning x 2 weeks, the increase to 2 tabs daily for anxiety.  Attention deficit disorder, unspecified hyperactivity presence -     amphetamine-dextroamphetamine (ADDERALL) 30 MG tablet; Take 1 tablet by mouth daily.  Further disposition pending results of labs. Discussed med's effects and SE's.   Over 30 minutes of exam, counseling, chart review, and critical decision making was performed.   Future Appointments  Date Time Provider Department Center  02/09/2019  2:00 PM Judd Gaudier, NP [facility] None    ------------------------------------------------------------------------------------------------------------------   HPI BP 120/80   Pulse (!) 102   Temp 97.9 F (36.6 C)   Ht 5\' 5"  (1.651 m)   Wt 158 lb (71.7 kg)   SpO2 99%   BMI 26.29 kg/m   33 y.o.female presents for follow up due to anxiety. 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   She reports some poor relationships with family, stress at work, father has been ill; has been anxious daily, very agitated, yelling and lashing out which is atypical for her. Triggered by minor things that typically don't bother her. She does have steady relationship with boyfriend though worries if this continue may harm that relationship. She has good support from friends.   We had initiated buspar at last visit but to history of poor benefit with SSRIs (lexapro and zoloft, perceived  minmal benefit at last flare 5 years ago), but she reports since initiating medication felt more angry, screaming at people, acting like a different person and self tapered off.   Has tried numerous counselors in the past, has seen "like 10" and never perceived benefit, in some cases felt frustrated and worse. She has working with friend who is a , also following a book on CBT per our previous discussion and feels this is more beneficial.   She takes xanax PRN later in day or evening if needed, limiting to 2-3 days a week since avoiding people and meditating more. She is on adderall PRN for hx of ADD, takes on days that she works only, feels significant improvement with focus. She has not noted significant difference in anxiety on days that she uses vs not. We have discussed at length that adderall may exacerbate anxiety symptoms.   Sleep is unchanged from baseline; manages well with melatonin.   PHQ-9 of 2 today, GAD-7 score is 16 today    Past Medical History:  Diagnosis Date  . ADD (attention deficit disorder with hyperactivity)   . Anemia    iron  . Anxiety   . Back pain   . GERD (gastroesophageal reflux disease)   . Graves disease 2014   Dr. 2015   . Pelvic pain in female   . Vitamin B12 deficiency   . Vitamin D deficiency      Allergies  Allergen Reactions  . Codeine Nausea And Vomiting  . Milk-Related Compounds Hives and Nausea And Vomiting  . Sulfonamide Derivatives Nausea And  Vomiting  . Penicillins Nausea And Vomiting    PER PATIENT just nausea and vomiting    Current Outpatient Medications on File Prior to Visit  Medication Sig  . acetaminophen (TYLENOL) 500 MG tablet Take 2 tablets (1,000 mg total) by mouth every 6 (six) hours as needed.  . ALPRAZolam (XANAX) 0.5 MG tablet Take 1/2-1 tab once or twice a day if needed only for panic attacks. Limit to <5 days per week to avoid tolerance.  . etonogestrel-ethinyl estradiol (NUVARING) 0.12-0.015 MG/24HR  vaginal ring Insert vaginally and leave in place for 3 consecutive weeks, then remove for 1 week.  . MELOXICAM PO Take by mouth as needed.   . Multiple Vitamin (MULTIVITAMIN) tablet Take 1 tablet by mouth daily.   No current facility-administered medications on file prior to visit.     ROS: all negative except above.   Physical Exam:  BP 120/80   Pulse (!) 102   Temp 97.9 F (36.6 C)   Ht 5\' 5"  (1.651 m)   Wt 158 lb (71.7 kg)   SpO2 99%   BMI 26.29 kg/m   General Appearance: Well nourished, in no apparent distress. Eyes: PERRLA, conjunctiva no swelling or erythema ENT/Mouth: Hearing normal.  Neck: Supple, thyroid normal.  Respiratory: Respiratory effort normal, BS equal bilaterally without rales, rhonchi, wheezing or stridor.  Cardio: RRR with no MRGs. Brisk peripheral pulses without edema.  Abdomen: Soft, + BS.  Non tender, no guarding, rebound, hernias, masses. Lymphatics: Non tender without lymphadenopathy.  Musculoskeletal: No obvious deformity, symmetrical strength, normal gait.  Skin: Warm, dry without rashes, lesions, ecchymosis.  Neuro: Cranial nerves intact. Normal muscle tone, Sensation intact.  Psych: Awake and oriented X 3, normal affect, normal non-pressured speech, Insight and Judgment appropriate.     Izora Ribas, NP 5:18 PM [facility]

## 2019-02-05 NOTE — Progress Notes (Deleted)
Complete Physical  Assessment and Plan:  Diagnoses and all orders for this visit:  Encounter for routine adult health examination without abnormal findings  Gastroesophageal reflux disease with esophagitis, unspecified whether hemorrhage  Graves disease  Attention deficit hyperactivity disorder (ADHD), unspecified ADHD type  Anxiety  Vitamin D deficiency  Anemia, unspecified type  Vitamin B12 deficiency     Discussed med's effects and SE's. Screening labs and tests as requested with regular follow-up as recommended. Over 40 minutes of exam, counseling, chart review, and complex, high level critical decision making was performed this visit.   Future Appointments  Date Time Provider [place]  02/09/2019  2:00 PM [name], NP [facility] None  02/09/2020  2:00 PM [name], NP [facility] None     HPI  33 y.o. female  presents for a complete physical and follow up for has Graves disease; GERD (gastroesophageal reflux disease); ADHD (attention deficit hyperactivity disorder); Anemia; Anxiety; Vitamin B12 deficiency; and Vitamin D deficiency on their problem list.    She works as a Catering manager.  She does have steady relationship with boyfriend  She has hx of abnormal pap smear with LEEP procedure. *** ? Not in hx of surgeries   Has been on Nuvaring. Hx of irregular and very heavy menses. She had normal PAP in our office 07/2016 and recommended 3 year follow up. She apparently requested "hormones" be checked last year; total testosterone was drawn which was normal at 18.   She had reported SOB with exertion for several years, occasional heaviness in chest, palpitations, numerous neg Ddimers and has been to ED x 2. Has had neg CXR. On adderall but happens when not taking as well.   She has seen ortho, has been on meloxicam, bilateral feet pain, told RA, worse left foot than right foot. She has migrating joint complaints, she has had a rash on nap of her  neck x years, will get better but never goes away. She has history of Graves, with other family history of autoimmune. She had autoimmune workup last year including ANA, sed rate, RF, anti DNA, HLA B27 antibody, RNP antibody, SSB ([place]) antibody, and SSA ([place]) antibody were unremarkable.   Vitamin D and B12 levels were found to be low and she was advised to initiate supplementation.    Recently this year she presented reporting anxiety, agitation, yelling and lashing out atypical for her with minimal trigger. Works as a International aid/development worker, stress at work r/t covid 90, father ill. She reported good support from boyfriend and friends.   Hx of poor benefit with SSRIs (lexapro, zoloft, minimal benefit), tried buspar which seemed to cause more anger, screaming at people, acting like a different person and self tapered off. We initiated effexor to try at last visit *** vraylar ***  Has tried numerous counselors in the past, has seen "like 10" and never perceived benefit, in some cases felt frustrated and worse. She has working with friend who is a Economist, also following a book on CBT per our previous discussion and feels this is more beneficial.   She takes xanax PRN later in day or evening if needed, limiting to 2-3 days a week since avoiding people and meditating more.   She is on adderall PRN for hx of ADD, takes on days that she works only, feels significant improvement with focus. She has not noted significant difference in anxiety on days that she uses vs not. We have discussed at length that adderall may exacerbate anxiety symptoms.  Sleep is unchanged from baseline; manages well with melatonin.   PHQ-9 of 2 today, GAD-7 score is 16 today     BMI is There is no height or weight on file to calculate BMI., she {HAS HAS ZOX:09604} been working on diet and exercise. Wt Readings from Last 3 Encounters:  12/29/18 158 lb (71.7 kg)  10/02/18 161 lb (73 kg)  05/26/18 156 lb (70.8 kg)   Today their BP  is   She {DOES_DOES VWU:98119} workout. She denies chest pain, shortness of breath, dizziness.   She {ACTION; IS/IS JYN:[id number]} on cholesterol medication and denies myalgias. Her cholesterol {ACTION; IS/IS NOT:21021397} at goal. The cholesterol last visit was:   Lab Results  Component Value Date   CHOL 163 01/28/2018   HDL 67 01/28/2018   LDLCALC 75 01/28/2018   TRIG 129 01/28/2018   CHOLHDL 2.4 01/28/2018    She {Has/has not:18111} been working on diet and exercise for glucose management and denies {Symptoms; diabetes w/o none:19199}. Last A1C in the office was:  Lab Results  Component Value Date   HGBA1C 5.1 08/23/2015   Last GFR: Lab Results  Component Value Date   GFRNONAA 80 01/28/2018   Patient is on Vitamin D supplement.   Lab Results  Component Value Date   VD25OH 15 (L) 01/28/2018     Lab Results  Component Value Date   WBC 6.9 01/28/2018   HGB 13.6 01/28/2018   HCT 41.5 01/28/2018   MCV 89.8 01/28/2018   PLT 322 01/28/2018   Lab Results  Component Value Date   IRON 77 01/28/2018   TIBC 403 01/28/2018   Lab Results  Component Value Date   VITAMINB12 285 01/28/2018     Current Medications:  Current Outpatient Medications on File Prior to Visit  Medication Sig Dispense Refill  . acetaminophen (TYLENOL) 500 MG tablet Take 2 tablets (1,000 mg total) by mouth every 6 (six) hours as needed. 100 tablet 2  . ALPRAZolam (XANAX) 0.5 MG tablet Take 1/2-1 tab once or twice a day if needed only for panic attacks. Limit to <5 days per week to avoid tolerance. 60 tablet 0  . amphetamine-dextroamphetamine (ADDERALL) 30 MG tablet Take 1 tablet by mouth daily. 30 tablet 0  . etonogestrel-ethinyl estradiol (NUVARING) 0.12-0.015 MG/24HR vaginal ring Insert vaginally and leave in place for 3 consecutive weeks, then remove for 1 week. 3 each 1  . MELOXICAM PO Take by mouth as needed.     . Multiple Vitamin (MULTIVITAMIN) tablet Take 1 tablet by mouth daily.    Marland Kitchen  venlafaxine XR (EFFEXOR XR) 37.5 MG 24 hr capsule Start by taking 1 tab daily in the morning x 2 weeks, the increase to 2 tabs daily for anxiety. 60 capsule 1   No current facility-administered medications on file prior to visit.    Allergies:  Allergies  Allergen Reactions  . Codeine Nausea And Vomiting  . Milk-Related Compounds Hives and Nausea And Vomiting  . Sulfonamide Derivatives Nausea And Vomiting  . Penicillins Nausea And Vomiting    PER PATIENT just nausea and vomiting   Medical History:  She has Graves disease; GERD (gastroesophageal reflux disease); ADHD (attention deficit hyperactivity disorder); Anemia; Anxiety; Vitamin B12 deficiency; and Vitamin D deficiency on their problem list. Health Maintenance:   Immunization History  Administered Date(s) Administered  . HPV Quadrivalent 09/09/2001, 06/14/2005, 10/16/2005  . Hepatitis B 04/03/1999  . Meningococcal Polysaccharide 04/03/1999  . Td 10/26/2004  . Tdap 09/13/2014    Tetanus: Pneumovax:  Prevnar 13:  Flu vaccine: Zostavax: LMP: Pap: MGM:  DEXA: Colonoscopy: EGD:  Last Dental Exam: Last Eye Exam:  Patient Care Team: Unk Pinto, MD as PCP - General (Internal Medicine) Gatha Mayer, MD as Consulting Physician (Gastroenterology) Megan Salon, MD as Consulting Physician (Gynecology) Despina Hick, MD (Dermatology)  Surgical History:  She has a past surgical history that includes Wisdom tooth extraction; gum grafts; and laparoscopy (02/19/2011). Family History:  Herfamily history includes Cancer in her father, maternal uncle, paternal grandfather, and paternal grandmother; Cancer (age of onset: 55) in her maternal grandmother; Heart disease in her brother, maternal grandfather, maternal grandmother, paternal grandfather, and paternal grandmother; Heart murmur in her brother; Hyperlipidemia in her paternal grandfather; Hypertension in her paternal grandfather; Stroke (age of onset: 75) in her  mother. Social History:  She reports that she has never smoked. She has never used smokeless tobacco. She reports that she does not drink alcohol or use drugs.  Review of Systems: *** Review of Systems  Constitutional: Negative for malaise/fatigue and weight loss.  HENT: Negative for hearing loss and tinnitus.   Eyes: Negative for blurred vision and double vision.  Respiratory: Negative for cough, sputum production, shortness of breath and wheezing.   Cardiovascular: Negative for chest pain, palpitations, orthopnea, claudication, leg swelling and PND.  Gastrointestinal: Negative for abdominal pain, blood in stool, constipation, diarrhea, heartburn, melena, nausea and vomiting.  Genitourinary: Negative.   Musculoskeletal: Negative for falls, joint pain and myalgias.  Skin: Negative for rash.  Neurological: Negative for dizziness, tingling, sensory change, weakness and headaches.  Endo/Heme/Allergies: Negative for polydipsia.  Psychiatric/Behavioral: Negative.  Negative for depression, memory loss, substance abuse and suicidal ideas. The patient is not nervous/anxious and does not have insomnia.   All other systems reviewed and are negative.   Physical Exam: Estimated body mass index is 26.29 kg/m as calculated from the following:   Height as of 12/29/18: _0  (1.651 m).   Weight as of 12/29/18: 158 lb (71.7 kg). There were no vitals taken for this visit. General Appearance: Well nourished, in no apparent distress.  Eyes: PERRLA, EOMs, conjunctiva no swelling or erythema, normal fundi and vessels.  Sinuses: No Frontal/maxillary tenderness  ENT/Mouth: Ext aud canals clear, normal light reflex with TMs without erythema, bulging. Good dentition. No erythema, swelling, or exudate on post pharynx. Tonsils not swollen or erythematous. Hearing normal.  Neck: Supple, thyroid normal. No bruits  Respiratory: Respiratory effort normal, BS equal bilaterally without rales, rhonchi, wheezing or  stridor.  Cardio: RRR without murmurs, rubs or gallops. Brisk peripheral pulses without edema.  Chest: symmetric, with normal excursions and percussion.  Breasts: Symmetric, without lumps, nipple discharge, retractions. *** Abdomen: Soft, nontender, no guarding, rebound, hernias, masses, or organomegaly.  Lymphatics: Non tender without lymphadenopathy.  Genitourinary: *** Musculoskeletal: Full ROM all peripheral extremities,5/5 strength, and normal gait.  Skin: Warm, dry without rashes, lesions, ecchymosis. Neuro: Cranial nerves intact, reflexes equal bilaterally. Normal muscle tone, no cerebellar symptoms. Sensation intact.  Psych: Awake and oriented X 3, normal affect, Insight and Judgment appropriate.   EKG: WNL no ST changes. ***  Izora Ribas 3:38 PM [facility]

## 2019-02-09 ENCOUNTER — Encounter: Payer: Self-pay | Admitting: Adult Health

## 2019-02-09 ENCOUNTER — Encounter: Payer: Self-pay | Admitting: Physician Assistant

## 2019-02-24 NOTE — Progress Notes (Signed)
Complete Physical  Assessment and Plan:  Chamika was seen today for annual exam.  Diagnoses and all orders for this visit:  Encounter for routine adult health examination with abnormal findings -     CBC with Diff -     COMPLETE METABOLIC PANEL WITH GFR -     Magnesium -     Urinalysis, Routine w reflex microscopic  Gastroesophageal reflux disease with esophagitis, unspecified whether hemorrhage Well managed on current medications Discussed diet, avoiding triggers and other lifestyle changes  Graves disease Est with Dr. Forde Dandy, monitor TSH, refer back if trending down -     TSH  Vitamin B12 deficiency Continue supplement  -     Vitamin B12  Vitamin D deficiency .Continue supplement  -     Vitamin D (25 hydroxy)  Anxiety state Improved with low dose effexor; continue Stress management techniques discussed, increase water, good sleep hygiene discussed, increase exercise, and increase veggies.  -     venlafaxine XR (EFFEXOR XR) 37.5 MG 24 hr capsule; Take 1-2 tabs by mouth daily for anxiety and mood. -     ALPRAZolam (XANAX) 0.5 MG tablet; Take 1/2-1 tab once or twice a day if needed only for panic attacks. Limit to <5 days per week to avoid tolerance.  OCP (oral contraceptive pills) initiation -     etonogestrel-ethinyl estradiol (NUVARING) 0.12-0.015 MG/24HR vaginal ring; Insert vaginally and leave in place for 3 consecutive weeks, then remove for 1 week.  Attention deficit disorder, unspecified hyperactivity presence Med helps with focus, no AE's. The patient was counseled on the addictive nature of the medication and was encouraged to take drug holidays when not needed. Monitor via PDMP.  -     amphetamine-dextroamphetamine (ADDERALL) 30 MG tablet; Take 1 tablet by mouth daily.  Screening for diabetes mellitus/ family history of diabetes mellitus -     Insulin, random -     HbA1C  Overweight (BMI 25.0-29.9) Long discussion about weight loss, diet, and  exercise Recommended diet heavy in fruits and veggies and low in animal meats, cheeses, and dairy products, appropriate calorie intake Discussed appropriate weight for height Follow up at next visit -     Insulin, random  Irregular bowel habits If not on benefiber then add it, decrease stress,  if any worsening symptoms, blood in stool, AB pain, etc call office Fodmap information provided and suggested elimination diet  Other orders -     ondansetron (ZOFRAN) 4 MG tablet; Take 1 tablet (4 mg total) by mouth daily as needed for nausea or vomiting.   Discussed med's effects and SE's. Screening labs and tests as requested with regular follow-up as recommended. Over 40 minutes of exam, counseling, chart review, and complex, high level critical decision making was performed this visit.   Future Appointments  Date Time Provider [place]  03/01/2020  9:00 AM [name], NP [facility] None     HPI  33 y.o. female  presents for a complete physical and follow up for has Graves disease; GERD (gastroesophageal reflux disease); ADHD (attention deficit hyperactivity disorder); Anxiety; Vitamin B12 deficiency; and Vitamin D deficiency on their problem list.   She works as a Catering manager.  She does have steady relationship with boyfriend  She has hx of abnormal pap smear with LEEP procedure. Not in hx of surgeries   Has been on Nuvaring. Hx of irregular and very heavy menses. She had normal PAP in our office 07/2016 and recommended 3 year follow up. GYN  wasn't fully covered by insurance.   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 Has been recommended surgical intervention but postponing. She has history of Graves, with other family history of autoimmune. She had autoimmune workup last year including ANA, sed rate, RF, anti DNA, HLA B27 antibody, RNP antibody, SSB ([place]) antibody, and SSA ([place]) antibody  were unremarkable.  Vitamin D and B12 levels were found to be low and she was advised to initiate supplementation.   Recently this year she presented reporting anxiety, agitation, yelling and lashing out atypical for her with minimal trigger. Works as a International aid/development worker, stress at work r/t covid 19, father ill. She reported good support from boyfriend and friends. Hx of poor benefit with SSRIs (lexapro, zoloft, minimal benefit), tried buspar which seemed to cause more anger, screaming at people, acting like a different person and self tapered off. We initiated effexor to try at last visit, reports she is doing fairly well taking 37.5 mg on days at home and 75 mg on days that she works. Gets sleepy if takes 2 tabs on days she is off. She is also prescribed PRN xanax taking 2-3 tabs/week. Has seen numerous counselors in the past without benefit.   She is on adderall PRN for hx of ADD, takes on days that she works only, feels significant improvement with focus. She has not noted significant difference in anxiety on days that she uses vs not. We have discussed at length that adderall may exacerbate anxiety symptoms.   Sleep is unchanged from baseline; manages well with melatonin.   BMI is Body mass index is 25.95 kg/m., she has been working on diet and exercise. Wt Readings from Last 3 Encounters:  02/25/19 160 lb 12.8 oz (72.9 kg)  12/29/18 158 lb (71.7 kg)  10/02/18 161 lb (73 kg)   Today their BP is BP: 136/82 She does workout. She denies chest pain, shortness of breath, dizziness.   The cholesterol last visit was:   Lab Results  Component Value Date   CHOL 163 01/28/2018   HDL 67 01/28/2018   LDLCALC 75 01/28/2018   TRIG 129 01/28/2018   CHOLHDL 2.4 01/28/2018   Last A1C in the office was:  Lab Results  Component Value Date   HGBA1C 5.1 08/23/2015   She has documented history of Graves disease, formerly followed by Dr. Forde Dandy, did monitoring only, never was treated and advised monitoring  only unless TSHs are persistently low and trending Lab Results  Component Value Date   TSH 0.34 (L) 10/02/2018   Last GFR: Lab Results  Component Value Date   GFRNONAA 80 01/28/2018   Patient is on Vitamin D supplement, 4000 IU daily    Lab Results  Component Value Date   VD25OH 15 (L) 01/28/2018     She is on a sublingual  Lab Results  Component Value Date   VITAMINB12 285 01/28/2018     Current Medications:  Current Outpatient Medications on File Prior to Visit  Medication Sig Dispense Refill  . acetaminophen (TYLENOL) 500 MG tablet Take 2 tablets (1,000 mg total) by mouth every 6 (six) hours as needed. 100 tablet 2  . b complex vitamins tablet Take 1 tablet by mouth daily. Takes 1 tablet SL daily.    . Cholecalciferol (VITAMIN D) 50 MCG (2000 UT) CAPS Take 2 capsules by mouth daily.    . MELOXICAM PO Take by mouth as needed.     Marland Kitchen  Multiple Vitamin (MULTIVITAMIN) tablet Take 1 tablet by mouth daily.    . Probiotic Product (PROBIOTIC DAILY PO) Take 1 tablet by mouth daily.     No current facility-administered medications on file prior to visit.    Allergies:  Allergies  Allergen Reactions  . Codeine Nausea And Vomiting  . Milk-Related Compounds Hives and Nausea And Vomiting  . Sulfonamide Derivatives Nausea And Vomiting  . Penicillins Nausea And Vomiting    PER PATIENT just nausea and vomiting   Medical History:  She has Graves disease; GERD (gastroesophageal reflux disease); ADHD (attention deficit hyperactivity disorder); Anxiety; Vitamin B12 deficiency; and Vitamin D deficiency on their problem list. Health Maintenance:   Immunization History  Administered Date(s) Administered  . HPV Quadrivalent 09/09/2001, 06/14/2005, 10/16/2005  . Hepatitis B 04/03/1999  . Meningococcal Polysaccharide 04/03/1999  . Td 10/26/2004  . Tdap 09/13/2014    Tetanus: 2016 Flu vaccine: declines HPV: 3/3  LMP: No LMP recorded. Pap: 07/2016 neg HPV, due 2021 here  MGM:  n/a  Colonoscopy: n/a EGD:  Last Dental Exam: Dr. Randel Pigg in [place], last 2020, q73mLast Eye Exam: [facility], last 2020  Last [place] [facility] [place] annually due to fam hx of melanoma   Patient Care Team: MUnk Pinto MD as PCP - General (Internal Medicine) GGatha Mayer MD as Consulting Physician (Gastroenterology) MMegan Salon MD as Consulting Physician (Gynecology) DDespina Hick MD (Dermatology)  Surgical History:  She has a past surgical history that includes Wisdom tooth extraction; gum grafts; and laparoscopy (02/19/2011). Family History:  Herfamily history includes Breast cancer (age of onset: 567 in her maternal grandmother; Cancer in her father, maternal uncle, paternal grandfather, and paternal grandmother; Heart disease in her brother, maternal grandfather, maternal grandmother, paternal grandfather, and paternal grandmother; Heart murmur in her brother; Hyperlipidemia in her paternal grandfather; Hypertension in her paternal grandfather; Pancreatic cancer in her maternal grandmother; Stroke (age of onset: 529 in her mother. Social History:  She reports that she has never smoked. She has never used smokeless tobacco. She reports that she does not drink alcohol or use drugs.  Review of Systems:  Review of Systems  Constitutional: Negative for malaise/fatigue and weight loss.  HENT: Negative for hearing loss and tinnitus.   Eyes: Negative for blurred vision and double vision.  Respiratory: Negative for cough, sputum production, shortness of breath and wheezing.   Cardiovascular: Negative for chest pain, palpitations, orthopnea, claudication, leg swelling and PND.  Gastrointestinal: Positive for constipation (very irregular). Negative for abdominal pain, blood in stool, diarrhea, heartburn, melena, nausea and vomiting.  Genitourinary: Negative.   Musculoskeletal: Negative for falls, joint pain and myalgias.  Skin: Negative for rash.  Neurological: Positive for  headaches (sinus HA in airplane). Negative for dizziness, tingling, sensory change and weakness.  Endo/Heme/Allergies: Positive for environmental allergies. Negative for polydipsia.  Psychiatric/Behavioral: Negative for depression, memory loss, substance abuse and suicidal ideas. The patient is nervous/anxious. The patient does not have insomnia.   All other systems reviewed and are negative.   Physical Exam: Estimated body mass index is 25.95 kg/m as calculated from the following:   Height as of this encounter: '5\' 6"'$  (1.676 m).   Weight as of this encounter: 160 lb 12.8 oz (72.9 kg). BP 136/82   Pulse (!) 104   Temp (!) 97 F (36.1 C)   Resp 16   Ht '5\' 6"'$  (1.676 m)   Wt 160 lb 12.8 oz (72.9 kg)   BMI 25.95 kg/m  General Appearance: Well nourished, in no apparent distress.  Eyes: PERRLA, EOMs, conjunctiva no swelling or erythema, defer fundal exam to vision provider Sinuses: No Frontal/maxillary tenderness  ENT/Mouth: Ext aud canals clear, normal light reflex with TMs without erythema, bulging. Good dentition. No erythema, swelling, or exudate on post pharynx. Tonsils not swollen or erythematous. Hearing normal.  Neck: Supple, thyroid normal. No bruits  Respiratory: Respiratory effort normal, BS equal bilaterally without rales, rhonchi, wheezing or stridor.  Cardio: RRR without murmurs, rubs or gallops. Brisk peripheral pulses without edema.  Chest: symmetric, with normal excursions and percussion.  Breasts: Symmetric, without lumps, nipple discharge, retractions. Abdomen: Soft, nontender, no guarding, rebound, hernias, masses, or organomegaly.  Lymphatics: Non tender without lymphadenopathy.  Genitourinary: defer Musculoskeletal: Full ROM all peripheral extremities,5/5 strength, and normal gait.  Skin: Warm, dry without rashes, lesions, ecchymosis. Neuro: Cranial nerves intact, reflexes equal bilaterally. Normal muscle tone, no cerebellar symptoms. Sensation intact.  Psych: Awake  and oriented X 3, normal affect, Insight and Judgment appropriate.   EKG: WNL no ST changes reviewed from 2019, defer this year  ALLYCE BOCHICCHIO 10:00 AM [facility]

## 2019-02-25 ENCOUNTER — Other Ambulatory Visit: Payer: Self-pay

## 2019-02-25 ENCOUNTER — Ambulatory Visit (INDEPENDENT_AMBULATORY_CARE_PROVIDER_SITE_OTHER): Payer: Managed Care, Other (non HMO) | Admitting: Adult Health

## 2019-02-25 ENCOUNTER — Encounter: Payer: Self-pay | Admitting: Adult Health

## 2019-02-25 VITALS — BP 136/82 | HR 104 | Temp 97.0°F | Resp 16 | Ht 66.0 in | Wt 160.8 lb

## 2019-02-25 DIAGNOSIS — Z1389 Encounter for screening for other disorder: Secondary | ICD-10-CM | POA: Diagnosis not present

## 2019-02-25 DIAGNOSIS — F411 Generalized anxiety disorder: Secondary | ICD-10-CM

## 2019-02-25 DIAGNOSIS — E663 Overweight: Secondary | ICD-10-CM

## 2019-02-25 DIAGNOSIS — Z6826 Body mass index (BMI) 26.0-26.9, adult: Secondary | ICD-10-CM

## 2019-02-25 DIAGNOSIS — E559 Vitamin D deficiency, unspecified: Secondary | ICD-10-CM

## 2019-02-25 DIAGNOSIS — Z833 Family history of diabetes mellitus: Secondary | ICD-10-CM

## 2019-02-25 DIAGNOSIS — F988 Other specified behavioral and emotional disorders with onset usually occurring in childhood and adolescence: Secondary | ICD-10-CM

## 2019-02-25 DIAGNOSIS — Z79899 Other long term (current) drug therapy: Secondary | ICD-10-CM

## 2019-02-25 DIAGNOSIS — Z30011 Encounter for initial prescription of contraceptive pills: Secondary | ICD-10-CM

## 2019-02-25 DIAGNOSIS — F909 Attention-deficit hyperactivity disorder, unspecified type: Secondary | ICD-10-CM

## 2019-02-25 DIAGNOSIS — E0500 Thyrotoxicosis with diffuse goiter without thyrotoxic crisis or storm: Secondary | ICD-10-CM

## 2019-02-25 DIAGNOSIS — Z0001 Encounter for general adult medical examination with abnormal findings: Secondary | ICD-10-CM

## 2019-02-25 DIAGNOSIS — Z131 Encounter for screening for diabetes mellitus: Secondary | ICD-10-CM | POA: Diagnosis not present

## 2019-02-25 DIAGNOSIS — K2100 Gastro-esophageal reflux disease with esophagitis, without bleeding: Secondary | ICD-10-CM

## 2019-02-25 DIAGNOSIS — Z0000 Encounter for general adult medical examination without abnormal findings: Secondary | ICD-10-CM | POA: Diagnosis not present

## 2019-02-25 DIAGNOSIS — E538 Deficiency of other specified B group vitamins: Secondary | ICD-10-CM

## 2019-02-25 DIAGNOSIS — Z13220 Encounter for screening for lipoid disorders: Secondary | ICD-10-CM

## 2019-02-25 DIAGNOSIS — F419 Anxiety disorder, unspecified: Secondary | ICD-10-CM

## 2019-02-25 MED ORDER — ONDANSETRON HCL 4 MG PO TABS: 4.0000 mg | ORAL_TABLET | Freq: Every day | ORAL | 1 refills | Status: AC | PRN

## 2019-02-25 MED ORDER — AMPHETAMINE-DEXTROAMPHETAMINE 30 MG PO TABS: 30.0000 mg | ORAL_TABLET | Freq: Every day | ORAL | 0 refills | Status: DC

## 2019-02-25 MED ORDER — ALPRAZOLAM 0.5 MG PO TABS: ORAL_TABLET | ORAL | 0 refills | Status: DC

## 2019-02-25 MED ORDER — ETONOGESTREL-ETHINYL ESTRADIOL 0.12-0.015 MG/24HR VA RING: VAGINAL_RING | VAGINAL | 1 refills | Status: DC

## 2019-02-25 MED ORDER — VENLAFAXINE HCL ER 37.5 MG PO CP24: ORAL_CAPSULE | ORAL | 1 refills | Status: DC

## 2019-02-25 NOTE — Patient Instructions (Addendum)
[name] , Thank you for taking time to come for your Annual Wellness Visit. I appreciate your ongoing commitment to your health goals. Please review the following plan we discussed and let me know if I can assist you in the future.   These are the goals we discussed: Goals    . Weight (lb) < 150 lb (68 kg)       This is a list of the screening recommended for you and due dates:  Health Maintenance  Topic Date Due  . Flu Shot  07/01/2019*  . Pap Smear  07/12/2019  . Tetanus Vaccine  09/12/2024  . HIV Screening  Completed  *Topic was postponed. The date shown is not the original due date.    Know what a healthy weight is for you (roughly BMI <25) and aim to maintain this  Aim for 7+ servings of fruits and vegetables daily  65-80+ fluid ounces of water or unsweet tea for healthy kidneys  Limit to max 1 drink of alcohol per day; avoid smoking/tobacco  Limit animal fats in diet for cholesterol and heart health - choose grass fed whenever available  Avoid highly processed foods, and foods high in saturated/trans fats  Aim for low stress - take time to unwind and care for your mental health  Aim for 150 min of moderate intensity exercise weekly for heart health, and weights twice weekly for bone health  Aim for 7-9 hours of sleep daily    Diet for Irritable Bowel Syndrome When you have irritable bowel syndrome (IBS), it is very important to eat the foods and follow the eating habits that are best for your condition. IBS may cause various symptoms such as pain in the abdomen, constipation, or diarrhea. Choosing the right foods can help to ease the discomfort from these symptoms. Work with your health care provider and diet and nutrition specialist (dietitian) to find the eating plan that will help to control your symptoms. What are tips for following this plan?      Keep a food diary. This will help you identify foods that cause symptoms. Write down: ? What you eat and  when you eat it. ? What symptoms you have. ? When symptoms occur in relation to your meals, such as "pain in abdomen 2 hours after dinner."  Eat your meals slowly and in a relaxed setting.  Aim to eat 5-6 small meals per day. Do not skip meals.  Drink enough fluid to keep your urine pale yellow.  Ask your health care provider if you should take an over-the-counter probiotic to help restore healthy bacteria in your gut (digestive tract). ? Probiotics are foods that contain good bacteria and yeasts.  Your dietitian may have specific dietary recommendations for you based on your symptoms. He or she may recommend that you: ? Avoid foods that cause symptoms. Talk with your dietitian about other ways to get the same nutrients that are in those problem foods. ? Avoid foods with gluten. Gluten is a protein that is found in rye, wheat, and barley. ? Eat more foods that contain soluble fiber. Examples of foods with high soluble fiber include oats, seeds, and certain fruits and vegetables. Take a fiber supplement if directed by your dietitian. ? Reduce or avoid certain foods called FODMAPs. These are foods that contain carbohydrates that are hard to digest. Ask your doctor which foods contain these carbohydrates. What foods are not recommended? The following are some foods and drinks that may make your symptoms  worse:  Fatty foods, such as french fries.  Foods that contain gluten, such as pasta and cereal.  Dairy products, such as milk, cheese, and ice cream.  Chocolate.  Alcohol.  Products with caffeine, such as coffee.  Carbonated drinks, such as soda.  Foods that are high in FODMAPs. These include certain fruits and vegetables.  Products with sweeteners such as honey, high fructose corn syrup, sorbitol, and mannitol. The items listed above may not be a complete list of foods and beverages you should avoid. Contact a dietitian for more information. What foods are good sources of fiber?  Your health care provider or dietitian may recommend that you eat more foods that contain fiber. Fiber can help to reduce constipation and other IBS symptoms. Add foods with fiber to your diet a little at a time so your body can get used to them. Too much fiber at one time might cause gas and swelling of your abdomen. The following are some foods that are good sources of fiber:  Berries, such as raspberries, strawberries, and blueberries.  Tomatoes.  Carrots.  Brown rice.  Oats.  Seeds, such as chia and pumpkin seeds. The items listed above may not be a complete list of recommended sources of fiber. Contact your dietitian for more options. Where to find more information  International Foundation for Functional Gastrointestinal Disorders: www.iffgd.[employer] of Diabetes and Digestive and Kidney Diseases: CarFlippers.tn Summary  When you have irritable bowel syndrome (IBS), it is very important to eat the foods and follow the eating habits that are best for your condition.  IBS may cause various symptoms such as pain in the abdomen, constipation, or diarrhea.  Choosing the right foods can help to ease the discomfort that comes from symptoms.  Keep a food diary. This will help you identify foods that cause symptoms.  Your health care provider or diet and nutrition specialist (dietitian) may recommend that you eat more foods that contain fiber. This information is not intended to replace advice given to you by your health care provider. Make sure you discuss any questions you have with your health care provider. Document Released: 06/09/2003 Document Revised: 07/09/2018 Document Reviewed: 11/20/2016 Elsevier Patient Education  2020 [employer].    Please go to the ER if you have any severe AB pain, unable to hold down food/water, blood in stool or vomit, chest pain, shortness of breath, or any worsening symptoms.   Consider keeping a food diary- common causes of  GI problems are dairy, certain carbs...  FODMAP stands for fermentable oligo-, di-, mono-saccharides and polyols (1). These are the scientific terms used to classify groups of carbs that are notorious for triggering digestive symptoms like bloating, gas and stomach pain.   FODMAPs are found in a wide range of foods in varying amounts. Some foods contain just one type, while others contain several.  The main dietary sources of the four groups of FODMAPs include:  Oligosaccharides: Wheat, rye, legumes and various fruits and vegetables, such as garlic and onions.  Disaccharides: Milk, yogurt and soft cheese. Lactose is the main carb.  Monosaccharides: Various fruit including figs and mangoes, and sweeteners such as honey and agave nectar. Fructose is the main carb.  Polyols: Certain fruits and vegetables including blackberries and lychee, as well as some low-calorie sweeteners like those in sugar-free gum.   Keep a food diary. This will help you identify foods that cause symptoms. Write down: ? What you eat and when. ? What symptoms you  have. ? When symptoms occur in relation to your meals.  Avoid foods that cause symptoms. Talk with your dietitian about other ways to get the same nutrients that are in these foods.  Eat your meals slowly, in a relaxed setting.  Aim to eat 5-6 small meals per day. Do not skip meals.  Drink enough fluids to keep your urine clear or pale yellow.  Ask your health care provider if you should take an over-the-counter probiotic during flare-ups to help restore healthy gut bacteria.  If you have cramping or diarrhea, try making your meals low in fat and high in carbohydrates. Examples of carbohydrates are pasta, rice, whole grain breads and cereals, fruits, and vegetables.  If dairy products cause your symptoms to flare up, try eating less of them. You might be able to handle yogurt better than other dairy products because it contains bacteria that help with  digestion.

## 2019-02-27 LAB — COMPLETE METABOLIC PANEL WITH GFR
AG Ratio: 1.6 (calc) (ref 1.0–2.5)
ALT: 10 U/L (ref 6–29)
AST: 11 U/L (ref 10–30)
Albumin: 4.4 g/dL (ref 3.6–5.1)
Alkaline phosphatase (APISO): 82 U/L (ref 31–125)
BUN: 10 mg/dL (ref 7–25)
CO2: 23 mmol/L (ref 20–32)
Calcium: 9.1 mg/dL (ref 8.6–10.2)
Chloride: 107 mmol/L (ref 98–110)
Creat: 0.76 mg/dL (ref 0.50–1.10)
GFR, Est African American: 119 mL/min/{1.73_m2} (ref >=60)
GFR, Est Non African American: 103 mL/min/{1.73_m2} (ref >=60)
Globulin: 2.7 g/dL (calc) (ref 1.9–3.7)
Glucose, Bld: 100 mg/dL — ABNORMAL HIGH (ref 65–99)
Potassium: 4.3 mmol/L (ref 3.5–5.3)
Sodium: 138 mmol/L (ref 135–146)
Total Bilirubin: 0.3 mg/dL (ref 0.2–1.2)
Total Protein: 7.1 g/dL (ref 6.1–8.1)

## 2019-02-27 LAB — CBC WITH DIFFERENTIAL/PLATELET
Absolute Monocytes: 366 cells/uL (ref 200–950)
Basophils Absolute: 43 cells/uL (ref 0–200)
Basophils Relative: 0.7 %
Eosinophils Absolute: 128 cells/uL (ref 15–500)
Eosinophils Relative: 2.1 %
HCT: 38.5 % (ref 35.0–45.0)
Hemoglobin: 12.7 g/dL (ref 11.7–15.5)
Lymphs Abs: 1458 cells/uL (ref 850–3900)
MCH: 29.7 pg (ref 27.0–33.0)
MCHC: 33 g/dL (ref 32.0–36.0)
MCV: 90.2 fL (ref 80.0–100.0)
MPV: 12.6 fL — ABNORMAL HIGH (ref 7.5–12.5)
Monocytes Relative: 6 %
Neutro Abs: 4105 cells/uL (ref 1500–7800)
Neutrophils Relative %: 67.3 %
Platelets: 316 10*3/uL (ref 140–400)
RBC: 4.27 10*6/uL (ref 3.80–5.10)
RDW: 12.4 % (ref 11.0–15.0)
Total Lymphocyte: 23.9 %
WBC: 6.1 10*3/uL (ref 3.8–10.8)

## 2019-02-27 LAB — HEMOGLOBIN A1C
Hgb A1c MFr Bld: 5.1 % of total Hgb (ref <5.7)
Mean Plasma Glucose: 100 (calc)
eAG (mmol/L): 5.5 (calc)

## 2019-02-27 LAB — URINALYSIS, ROUTINE W REFLEX MICROSCOPIC
Bilirubin Urine: NEGATIVE
Glucose, UA: NEGATIVE
Hgb urine dipstick: NEGATIVE
Ketones, ur: NEGATIVE
Leukocytes,Ua: NEGATIVE
Nitrite: NEGATIVE
Protein, ur: NEGATIVE
Specific Gravity, Urine: 1.022 (ref 1.001–1.03)
pH: 6 (ref 5.0–8.0)

## 2019-02-27 LAB — VITAMIN D 25 HYDROXY (VIT D DEFICIENCY, FRACTURES): Vit D, 25-Hydroxy: 11 ng/mL — ABNORMAL LOW (ref 30–100)

## 2019-02-27 LAB — VITAMIN B12: Vitamin B-12: 316 pg/mL (ref 200–1100)

## 2019-02-27 LAB — MAGNESIUM: Magnesium: 2 mg/dL (ref 1.5–2.5)

## 2019-02-27 LAB — INSULIN, RANDOM: Insulin: 55.4 u[IU]/mL — ABNORMAL HIGH

## 2019-02-27 LAB — TSH: TSH: 0.32 mIU/L — ABNORMAL LOW

## 2019-03-02 ENCOUNTER — Other Ambulatory Visit: Payer: Self-pay

## 2019-03-02 ENCOUNTER — Other Ambulatory Visit: Payer: Self-pay | Admitting: Adult Health

## 2019-03-02 DIAGNOSIS — Z20822 Contact with and (suspected) exposure to covid-19: Secondary | ICD-10-CM

## 2019-03-02 MED ORDER — CYANOCOBALAMIN 1000 MCG/ML IJ SOLN: 1000.0000 ug | INTRAMUSCULAR | 2 refills | Status: DC

## 2019-03-03 LAB — NOVEL CORONAVIRUS, NAA: SARS-CoV-2, NAA: NOT DETECTED

## 2019-04-09 ENCOUNTER — Other Ambulatory Visit: Payer: Self-pay

## 2019-04-09 DIAGNOSIS — F988 Other specified behavioral and emotional disorders with onset usually occurring in childhood and adolescence: Secondary | ICD-10-CM

## 2019-04-10 MED ORDER — AMPHETAMINE-DEXTROAMPHETAMINE 30 MG PO TABS: ORAL_TABLET | ORAL | 0 refills | Status: DC

## 2019-05-05 ENCOUNTER — Other Ambulatory Visit: Payer: Self-pay

## 2019-05-05 DIAGNOSIS — F988 Other specified behavioral and emotional disorders with onset usually occurring in childhood and adolescence: Secondary | ICD-10-CM

## 2019-05-07 ENCOUNTER — Other Ambulatory Visit: Payer: Self-pay | Admitting: Internal Medicine

## 2019-05-07 DIAGNOSIS — F411 Generalized anxiety disorder: Secondary | ICD-10-CM

## 2019-05-07 MED ORDER — VENLAFAXINE HCL ER 75 MG PO CP24: ORAL_CAPSULE | ORAL | 1 refills | Status: DC

## 2019-05-07 MED ORDER — AMPHETAMINE-DEXTROAMPHETAMINE 30 MG PO TABS: ORAL_TABLET | ORAL | 0 refills | Status: DC

## 2019-06-01 ENCOUNTER — Other Ambulatory Visit: Payer: Self-pay | Admitting: Adult Health

## 2019-06-01 ENCOUNTER — Other Ambulatory Visit: Payer: Self-pay

## 2019-06-01 ENCOUNTER — Other Ambulatory Visit: Payer: Managed Care, Other (non HMO)

## 2019-06-01 DIAGNOSIS — E0500 Thyrotoxicosis with diffuse goiter without thyrotoxic crisis or storm: Secondary | ICD-10-CM

## 2019-06-02 LAB — THYROID PANEL WITH TSH
Free Thyroxine Index: 2.1 (ref 1.4–3.8)
T3 Uptake: 24 % (ref 22–35)
T4, Total: 8.7 ug/dL (ref 5.1–11.9)
TSH: 0.47 mIU/L

## 2019-06-15 ENCOUNTER — Ambulatory Visit (INDEPENDENT_AMBULATORY_CARE_PROVIDER_SITE_OTHER): Payer: 59 | Admitting: Podiatrist

## 2019-06-15 ENCOUNTER — Encounter: Payer: Self-pay | Admitting: Podiatrist

## 2019-06-15 ENCOUNTER — Other Ambulatory Visit: Payer: Self-pay

## 2019-06-15 DIAGNOSIS — L600 Ingrowing nail: Secondary | ICD-10-CM

## 2019-06-15 NOTE — Patient Instructions (Signed)

## 2019-06-16 ENCOUNTER — Encounter: Payer: Self-pay | Admitting: Podiatrist

## 2019-06-16 NOTE — Progress Notes (Signed)
  Chief Complaint  Patient presents with  . Nail Problem    Right foot; Hallux-medial side; pt stated, "I soaked in epsom salt; has helped"; xyrs     HPI: Patient is 34 y.o. female who presents today for a painful toenail growing into the skin on the right foot great toe.  She has tried epsom salt soaks which have helped some. Patient relates the problem comes and goes and has given her issues in the past.  She denies any drainage or swelling to the toe.  Denies any other complaints at this time.    Review of Systems  DATA OBTAINED: from patient  GENERAL: Feels well no fevers, no fatigue, no changes in appetite SKIN: No itching, no rashes, no open wounds EYES: No eye pain,no redness, no discharge EARS: No earache,no ringing of ears, NOSE: No congestion, no drainage, no bleeding  MOUTH/THROAT: No mouth pain, No sore throat, No difficulty chewing or swallowing  RESPIRATORY: No cough, no wheezing, no SOB CARDIAC: No chest pain,no heart palpitations, GI: No abdominal pain, No Nausea, no vomiting, no diarrhea, no heartburn or no reflux  GU: No dysuria, no increased frequency or urgency MUSCULOSKELETAL: No unrelieved bone/joint pain,  NEUROLOGIC: Awake, alert, appropriate to situation, No change in mental status. PSYCHIATRIC: No overt anxiety or sadness.No behavior issue.      Physical Exam  GENERAL APPEARANCE: Alert, conversant. Appropriately groomed. No acute distress.   VASCULAR: Pedal pulses palpable DP and PT bilateral.  Capillary refill time is immediate to all digits,  Proximal to distal cooling it warm to warm.  Digital hair growth is present bilateral   NEUROLOGIC: sensation is intact epicritically and protectively to 5.07 monofilament at 5/5 sites bilateral.  Light touch is intact bilateral, vibratory sensation intact bilateral, achilles tendon reflex is intact bilateral.   MUSCULOSKELETAL: acceptable muscle strength, tone and stability bilateral.  Intrinsic muscluature  intact bilateral.  Range of motion at ankle and first MPJ is normal bilateral.   DERMATOLOGIC: skin is warm, supple, and dry.  No open lesions noted.  No interdigital maceration noted bilateral.   Right hallux nail is ingrown on the medial side.  Pain with direct pressure along the medial nail fold is noted.  No redness, swelling, drainage or signs of infection present.      Assessment   Ingrowing right hallux nail - medial side  Plan  Treatment options and alternatives were discussed. Recommended a permanent removal of the medial nail border of the Right hallux nail. Patient agreed. Skin was prepped with alcohol and a local injection of lidocaine and Marcaine plain was infiltrated to anesthetize the toe. The toe was then prepped with Betadine and  exsanguinated. The medial nail border was removed and phenol applied.  It was cleansed well with alcohol. Antibiotic ointment and a dressing was then applied and the patient was given instructions for aftercare including soaks and long term care.  She will watch for any signs of infection including but not limited to redness, swelling, drainage, malodor, pain and will call immediately if any of these are noted.  Otherwise she will be seen back on an as needed basis.

## 2019-06-23 IMAGING — DX DG CHEST 2V
2 series · 2 of 2 positions shown · non-contrast
Comparison: None.

CLINICAL DATA: Chest heaviness.  Fullness.

EXAM:
CHEST  2 VIEW

[chest pa]
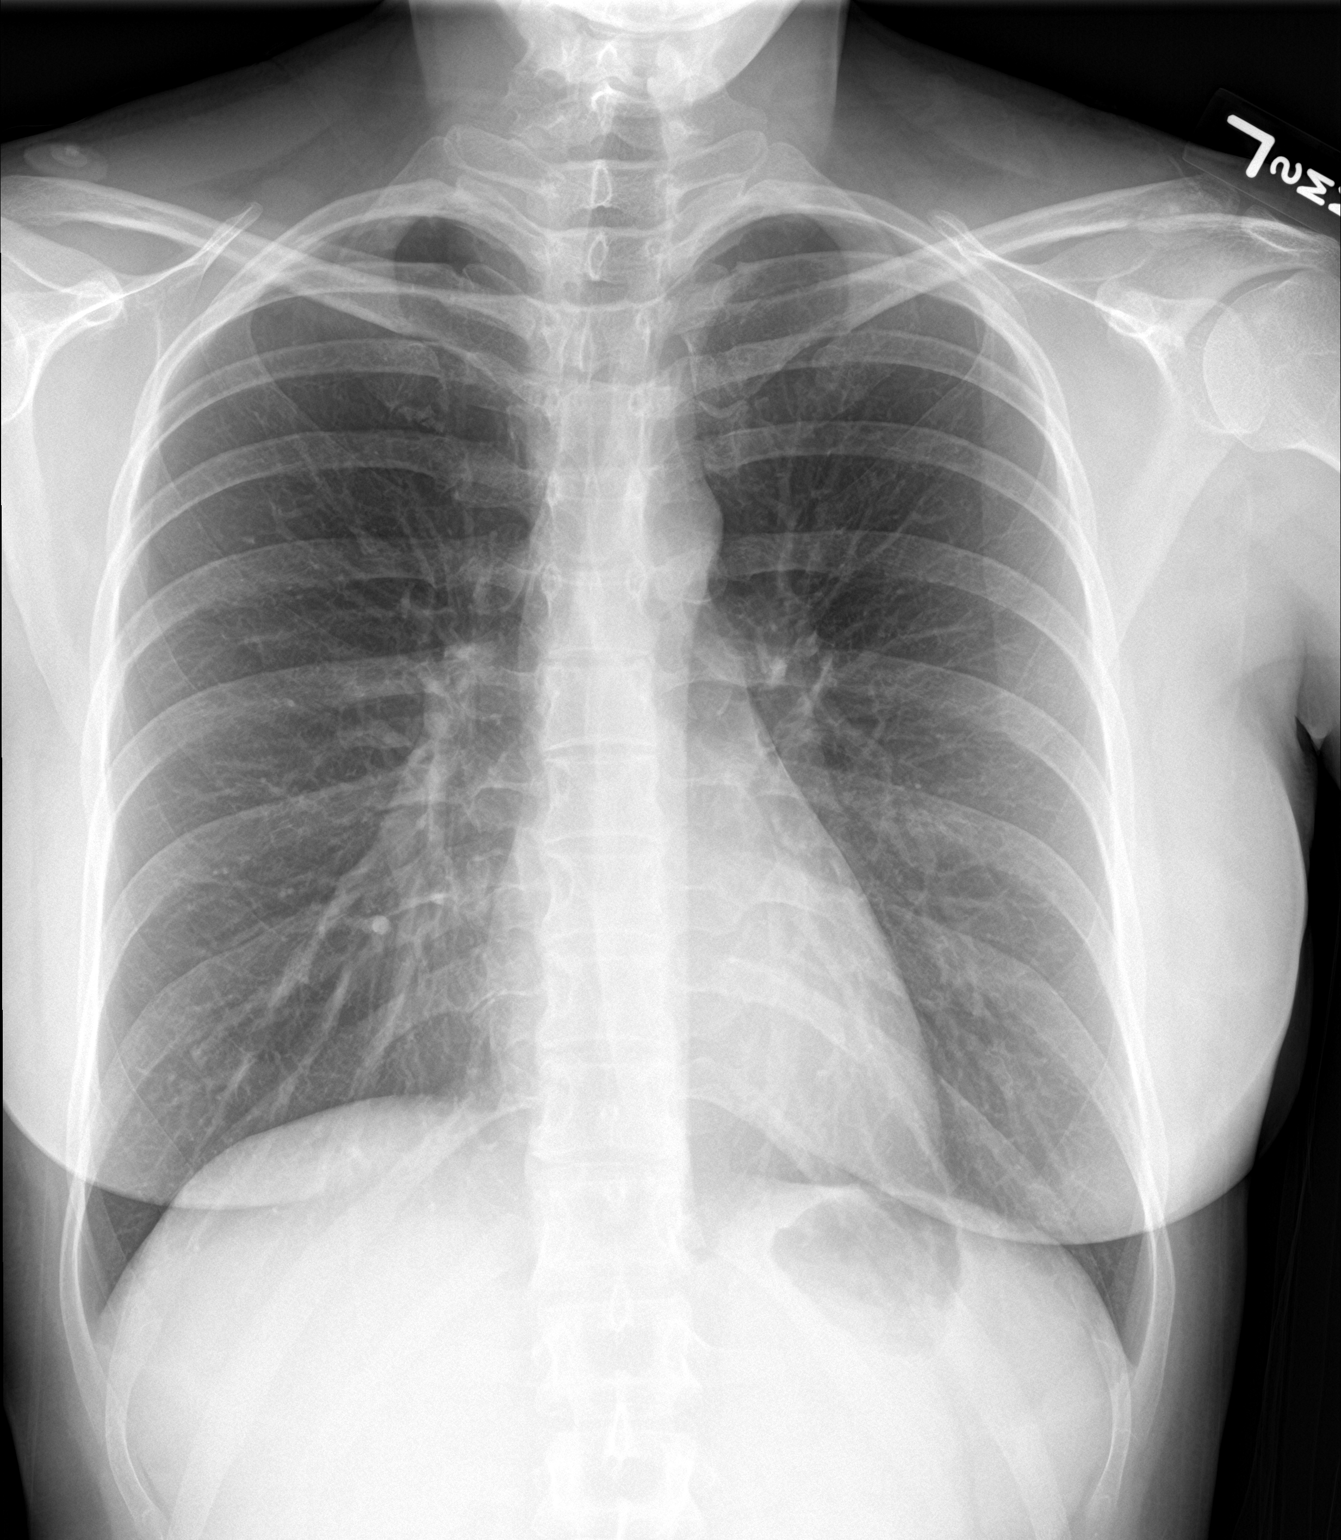

[chest lat]
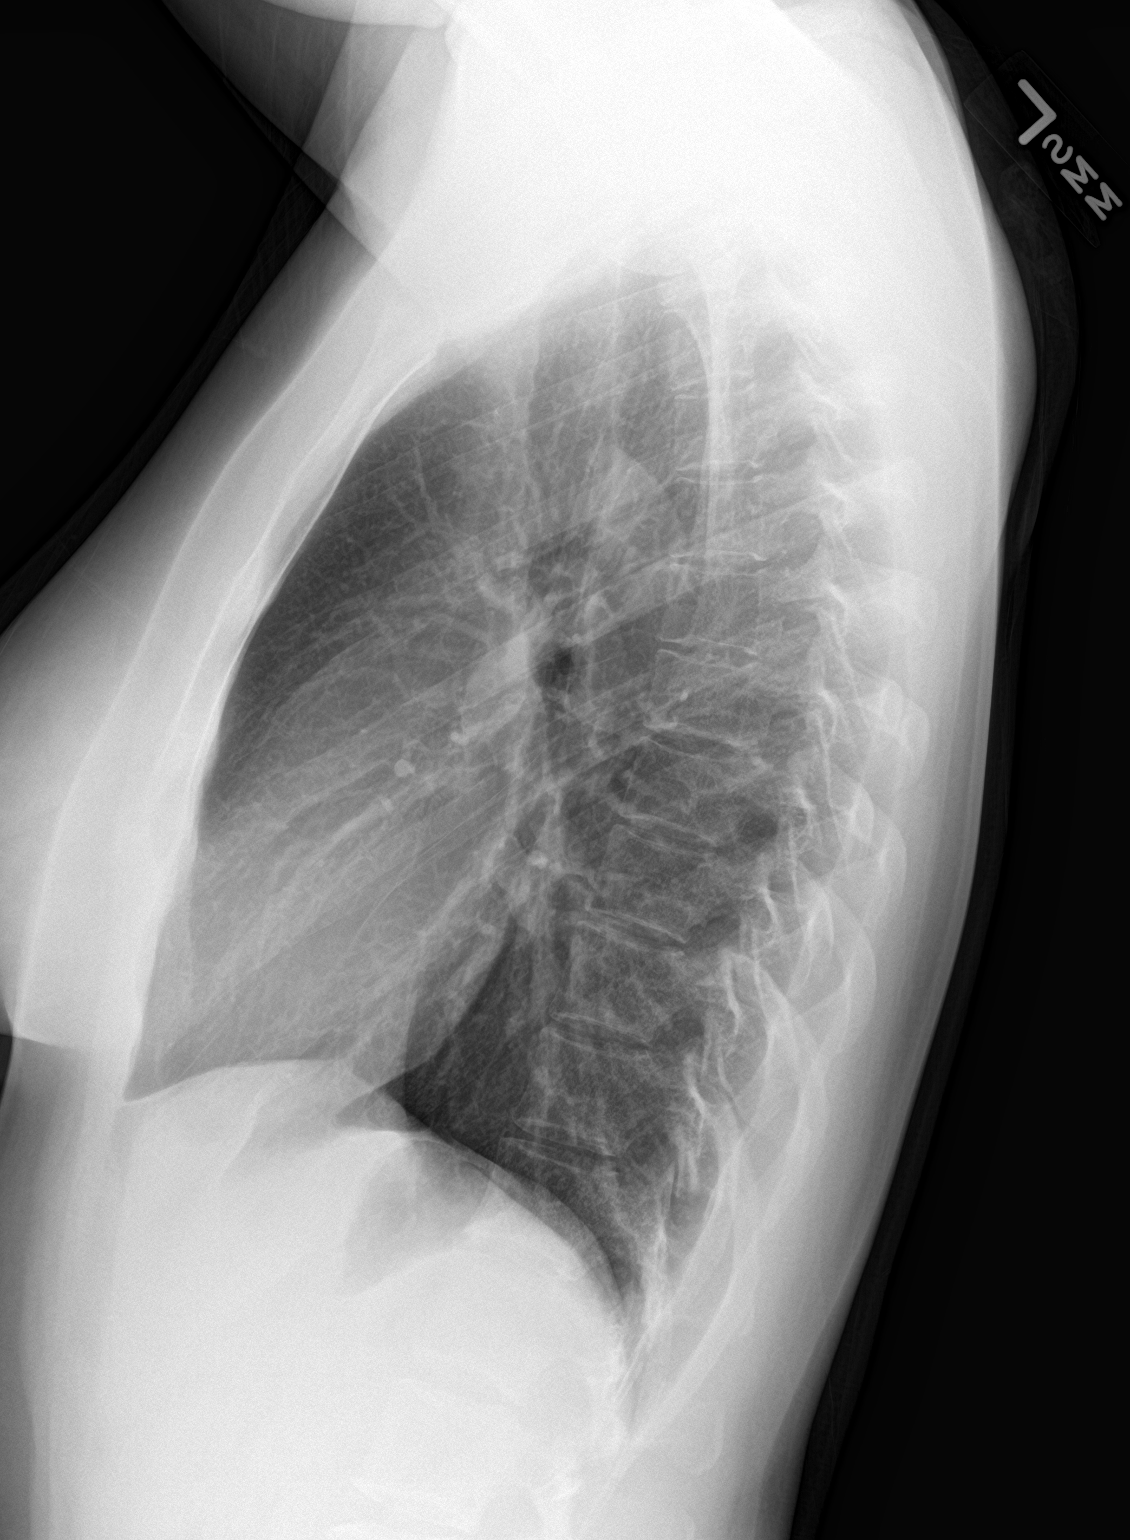

[2 of 2 positions shown; findings below may reference images not displayed]

FINDINGS: The heart size and mediastinal contours are within normal limits.
Both lungs are clear. The visualized skeletal structures are
unremarkable.
IMPRESSION: No active cardiopulmonary disease.

## 2019-06-24 ENCOUNTER — Other Ambulatory Visit: Payer: Self-pay

## 2019-06-24 DIAGNOSIS — F988 Other specified behavioral and emotional disorders with onset usually occurring in childhood and adolescence: Secondary | ICD-10-CM

## 2019-06-25 MED ORDER — AMPHETAMINE-DEXTROAMPHETAMINE 30 MG PO TABS: ORAL_TABLET | ORAL | 0 refills | Status: DC

## 2019-06-25 MED ORDER — ALPRAZOLAM 0.5 MG PO TABS: ORAL_TABLET | ORAL | 0 refills | Status: DC

## 2019-07-14 LAB — OB RESULTS CONSOLE ABO/RH: RH Type: POSITIVE

## 2019-07-14 LAB — OB RESULTS CONSOLE RUBELLA ANTIBODY, IGM: Rubella: IMMUNE

## 2019-07-14 LAB — OB RESULTS CONSOLE ANTIBODY SCREEN: Antibody Screen: NEGATIVE

## 2019-07-14 LAB — OB RESULTS CONSOLE GC/CHLAMYDIA
Chlamydia: NEGATIVE
Gonorrhea: NEGATIVE

## 2019-07-14 LAB — OB RESULTS CONSOLE HIV ANTIBODY (ROUTINE TESTING): HIV: NONREACTIVE

## 2019-07-14 LAB — OB RESULTS CONSOLE HEPATITIS B SURFACE ANTIGEN: Hepatitis B Surface Ag: NEGATIVE

## 2019-07-14 LAB — OB RESULTS CONSOLE RPR: RPR: NONREACTIVE

## 2019-07-26 ENCOUNTER — Other Ambulatory Visit: Payer: Self-pay

## 2019-07-26 DIAGNOSIS — F988 Other specified behavioral and emotional disorders with onset usually occurring in childhood and adolescence: Secondary | ICD-10-CM

## 2019-07-27 MED ORDER — AMPHETAMINE-DEXTROAMPHETAMINE 30 MG PO TABS: ORAL_TABLET | ORAL | 0 refills | Status: DC

## 2019-08-24 NOTE — Progress Notes (Deleted)
Assessment and Plan:  Gastroesophageal reflux disease with esophagitis, unspecified whether hemorrhage Well managed on current medications Discussed diet, avoiding triggers and other lifestyle changes  Graves disease Est with Dr. Forde Dandy, monitor TSH, refer back if trending down -     TSH  Vitamin D deficiency .Continue supplement  -     Vitamin D (25 hydroxy)  Anxiety state Improved with low dose effexor; continue Stress management techniques discussed, increase water, good sleep hygiene discussed, increase exercise, and increase veggies.  -     venlafaxine XR (EFFEXOR XR) 37.5 MG 24 hr capsule; Take 1-2 tabs by mouth daily for anxiety and mood. -     ALPRAZolam (XANAX) 0.5 MG tablet; Take 1/2-1 tab once or twice a day if needed only for panic attacks. Limit to <5 days per week to avoid tolerance.  Attention deficit disorder, unspecified hyperactivity presence Med helps with focus, no AE's. The patient was counseled on the addictive nature of the medication and was encouraged to take drug holidays when not needed. Monitor via PDMP.  -     amphetamine-dextroamphetamine (ADDERALL) 30 MG tablet; Take 1 tablet by mouth daily.  Overweight (BMI 25.0-29.9) Long discussion about weight loss, diet, and exercise Recommended diet heavy in fruits and veggies and low in animal meats, cheeses, and dairy products, appropriate calorie intake Discussed appropriate weight for height Follow up at next visit   Further disposition pending results of labs. Discussed med's effects and SE's.   Over 30 minutes of exam, counseling, chart review, and critical decision making was performed.   Future Appointments  Date Time Provider [place]  08/26/2019  9:00 AM [name], NP [facility] None  03/01/2020  9:00 AM [name], NP [facility] None    ------------------------------------------------------------------------------------------------------------------   HPI There were no  vitals taken for this visit.  34 y.o.female presents for 6 month follow up of anxiety, ADD, Grave's, vitamin D def and for overdue PAP.   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 We initiated effexor to try at last visit, reports she is doing fairly well taking 37.5 mg on days at home and 75 mg on days that she works. Gets sleepy if takes 2 tabs on days she is off. She is also prescribed PRN xanax taking 2-3 tabs/week. Has seen numerous counselors in the past without benefit.   She has ADD and has been well controlled on adderall 30 mg daily PRN; she is aware this can exacerbate anxiety, has been minimizing use and takes 1/2 tab only when needed.   She is monitored for hx of Graves disease, established with Dr. Forde Dandy but monitored at our office:  Lab Results  Component Value Date   TSH 0.47 06/01/2019  .  Today their BP is    She {DOES_DOES VZD:63875} workout. She denies chest pain, shortness of breath, dizziness.  Patient is on Vitamin D supplement.   Lab Results  Component Value Date   VD25OH 11 (L) 02/25/2019      BMI is There is no height or weight on file to calculate BMI., she {HAS HAS IEP:32951} been working on diet and exercise. Wt Readings from Last 3 Encounters:  02/25/19 160 lb 12.8 oz (72.9 kg)  12/29/18 158 lb (71.7 kg)  10/02/18 161 lb (73 kg)  Past Medical History:  Diagnosis Date  . ADD (attention deficit disorder with hyperactivity)   . Anemia    iron  . Anxiety   . Back pain   . GERD (gastroesophageal reflux disease)   . Graves disease 2014   Dr. Evlyn Kanner   . Pelvic pain in female   . Vitamin B12 deficiency   . Vitamin D deficiency      Allergies  Allergen Reactions  . Codeine Nausea And Vomiting  . Milk-Related Compounds  Hives and Nausea And Vomiting  . Sulfonamide Derivatives Nausea And Vomiting  . Penicillins Nausea And Vomiting    PER PATIENT just nausea and vomiting    Current Outpatient Medications on File Prior to Visit  Medication Sig  . ALPRAZolam (XANAX) 0.5 MG tablet Take 1/2-1 tab once or twice a day if needed only for panic attacks. Limit to <5 days per week to avoid tolerance.  Marland Kitchen amphetamine-dextroamphetamine (ADDERALL) 30 MG tablet Take 1 tab daily as needed for ADD; try to limit to <5/week due to addictive potential. Should last longer than 30 days.  Marland Kitchen b complex vitamins tablet Take 1 tablet by mouth daily. Takes 1 tablet SL daily.  . cyanocobalamin (,VITAMIN B-12,) 1000 MCG/ML injection Inject 1 mL (1,000 mcg total) into the skin every 30 (thirty) days.  Marland Kitchen etonogestrel-ethinyl estradiol (NUVARING) 0.12-0.015 MG/24HR vaginal ring Insert vaginally and leave in place for 3 consecutive weeks, then remove for 1 week.  . MELOXICAM PO Take by mouth as needed.   . Multiple Vitamin (MULTIVITAMIN) tablet Take 1 tablet by mouth daily.  . ondansetron (ZOFRAN) 4 MG tablet Take 1 tablet (4 mg total) by mouth daily as needed for nausea or vomiting.  . Probiotic Product (PROBIOTIC DAILY PO) Take 1 tablet by mouth daily.  Marland Kitchen venlafaxine XR (EFFEXOR XR) 75 MG 24 hr capsule Take 1 tablet every Morning for Mood and Anxiety   No current facility-administered medications on file prior to visit.    ROS: all negative except above.   Physical Exam:  There were no vitals taken for this visit.  General Appearance: Well nourished, in no apparent distress. Eyes: PERRLA, EOMs, conjunctiva no swelling or erythema ENT/Mouth: No erythema, swelling, or exudate on post pharynx.  Tonsils not swollen or erythematous. Hearing normal.  Neck: Supple, thyroid normal.  Respiratory: Respiratory effort normal, BS equal bilaterally without rales, rhonchi, wheezing or stridor.  Cardio: RRR with no MRGs. Brisk peripheral pulses  without edema.  Abdomen: Soft, + BS.  Non tender, no guarding, rebound, hernias, masses. Lymphatics: Non tender without lymphadenopathy.  Musculoskeletal: Full ROM, symmetrical strength, normal gait.  Skin: Warm, dry without rashes, lesions, ecchymosis.  Neuro: Cranial nerves intact. Normal muscle tone, no cerebellar symptoms. Sensation intact.  Psych: Awake and oriented X 3, anxious affect, Insight and Judgment appropriate.     Dan Maker, NP 8:06 AM [facility]

## 2019-08-26 ENCOUNTER — Ambulatory Visit: Payer: Managed Care, Other (non HMO) | Admitting: Adult Health

## 2019-08-31 ENCOUNTER — Other Ambulatory Visit: Payer: Self-pay

## 2019-08-31 DIAGNOSIS — F988 Other specified behavioral and emotional disorders with onset usually occurring in childhood and adolescence: Secondary | ICD-10-CM

## 2019-09-01 MED ORDER — AMPHETAMINE-DEXTROAMPHETAMINE 30 MG PO TABS: ORAL_TABLET | ORAL | 0 refills | Status: DC

## 2019-11-02 NOTE — Progress Notes (Signed)
6 MONTH FOLLOW UP  Assessment and Plan:    Gastroesophageal reflux disease with esophagitis, unspecified whether hemorrhage Well managed on current medications Discussed diet, avoiding triggers and other lifestyle changes  Graves disease Est with Dr. Evlyn Kanner, monitor TSH, refer back if trending down -     TSH  Vitamin B12 deficiency Continue supplement, doing injections -     Vitamin B12  Vitamin D deficiency .Continue supplement  -     Vitamin D (25 hydroxy)  Anxiety state Improved with effexor, but some break through with increased stress Stress management techniques discussed, increase water, good sleep hygiene discussed, increase exercise, and increase veggies.  Hx of SE, sedation with abrupt dose changes; will do slow taper up, may alternate between 75 and 150 mg dose every other day for a few weeks if needed, then gradually up to 150 mg daily, then send in higher dose cap -     venlafaxine XR (EFFEXOR XR) 75 MG 24 hr capsule; Take 1-2 caps every Morning for Mood and Anxiety  Attention deficit disorder, unspecified hyperactivity presence Med helps with focus, no AE's. The patient was counseled on the addictive nature of the medication and was encouraged to take drug holidays when not needed. Monitor via PDMP.  -     amphetamine-dextroamphetamine (ADDERALL) 30 MG tablet; Take 1 tablet by mouth daily.  Bmi 23 Excellent progress with reducing carbs and losing weight after elevated insulin level noted Continue diet heavy in fruits and veggies and low in animal meats, cheeses, and dairy products, appropriate calorie intake -     Insulin, random  Discussed med's effects and SE's. Screening labs and tests as requested with regular follow-up as recommended. Over 40 minutes of exam, counseling, chart review, and complex, high level critical decision making was performed this visit.   Future Appointments  Date Time Provider Department Center  03/01/2020  9:00 AM Judd Gaudier, NP  [facility] None     HPI  34 y.o. female  presents for 6 month follow up for has Graves disease; GERD (gastroesophageal reflux disease); ADHD (attention deficit hyperactivity disorder); Anxiety; Vitamin B12 deficiency; and Vitamin D deficiency on their problem list.   Last year she presented reporting anxiety. 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 She reported good support from boyfriend and friends.  Hx of poor benefit with SSRIs (lexapro, zoloft, minimal benefit), tried buspar with agitation. Was doing well with effexor 75 mg, but reports having some breakthrough and interested in trying to increase dose.   She is also prescribed PRN xanax taking 2-3 tabs/week.  Has seen numerous counselors in the past without perceived benefit.  Takes tylenol PM in the evening if needed for sleep.   She is on adderall PRN for hx of ADD, takes on days that she works only, feels significant improvement with focus. She has not noted significant difference in anxiety on days that she uses vs not. We have discussed at length that adderall may exacerbate anxiety symptoms.   BMI is Body mass index is 23.4 kg/m., she has been working on diet and exercise, reducing bread, alcohol, sweets due to elevated insulin level at last visit Wt Readings from Last 3 Encounters:  11/04/19 145 lb (65.8 kg)  02/25/19 160 lb 12.8 oz (72.9 kg)  12/29/18 158 lb (71.7 kg)   Today their BP is BP: 130/88 She does workout. She denies chest pain, shortness of breath, dizziness.   She has  documented history of Graves disease, formerly followed by Dr. Evlyn Kanner, did monitoring only, never was treated and advised monitoring only unless TSHs are persistently low and trending Lab Results  Component Value Date   TSH 0.47 06/01/2019   Patient is on Vitamin D supplement, up to taking 29518 IU caps daily    Lab Results  Component Value Date   VD25OH 11 (L) 02/25/2019     She  is on b12 shots, 1000 mcg monthly since last visit Lab Results  Component Value Date   VITAMINB12 316 02/25/2019     Current Medications:  Current Outpatient Medications on File Prior to Visit  Medication Sig Dispense Refill  . ALPRAZolam (XANAX) 0.5 MG tablet Take 1/2-1 tab once or twice a day if needed only for panic attacks. Limit to <5 days per week to avoid tolerance. 60 tablet 0  . amphetamine-dextroamphetamine (ADDERALL) 30 MG tablet Take 1 tab daily as needed for ADD; try to limit to <5/week due to addictive potential. Should last longer than 30 days. 30 tablet 0  . cyanocobalamin (,VITAMIN B-12,) 1000 MCG/ML injection Inject 1 mL (1,000 mcg total) into the skin every 30 (thirty) days. 10 mL 2  . etonogestrel-ethinyl estradiol (NUVARING) 0.12-0.015 MG/24HR vaginal ring Insert vaginally and leave in place for 3 consecutive weeks, then remove for 1 week. 3 each 1  . MELOXICAM PO Take by mouth as needed.     . Multiple Vitamin (MULTIVITAMIN) tablet Take 1 tablet by mouth daily.    . ondansetron (ZOFRAN) 4 MG tablet Take 1 tablet (4 mg total) by mouth daily as needed for nausea or vomiting. 30 tablet 1  . Probiotic Product (PROBIOTIC DAILY PO) Take 1 tablet by mouth daily.    Marland Kitchen venlafaxine XR (EFFEXOR XR) 75 MG 24 hr capsule Take 1 tablet every Morning for Mood and Anxiety 90 capsule 1   No current facility-administered medications on file prior to visit.   Allergies:  Allergies  Allergen Reactions  . Codeine Nausea And Vomiting  . Milk-Related Compounds Hives and Nausea And Vomiting  . Sulfonamide Derivatives Nausea And Vomiting  . Penicillins Nausea And Vomiting    PER PATIENT just nausea and vomiting   Medical History:  She has Graves disease; GERD (gastroesophageal reflux disease); ADHD (attention deficit hyperactivity disorder); Anxiety; Vitamin B12 deficiency; and Vitamin D deficiency on their problem list.  Surgical History:  She has a past surgical history that includes  Wisdom tooth extraction; gum grafts; and laparoscopy (02/19/2011). Family History:  Herfamily history includes Breast cancer (age of onset: 26) in her maternal grandmother; Cancer in her father, maternal uncle, paternal grandfather, and paternal grandmother; Heart disease in her brother, maternal grandfather, maternal grandmother, paternal grandfather, and paternal grandmother; Heart murmur in her brother; Hyperlipidemia in her paternal grandfather; Hypertension in her paternal grandfather; Pancreatic cancer in her maternal grandmother; Stroke (age of onset: 10) in her mother. Social History:  She reports that she has never smoked. She has never used smokeless tobacco. She reports that she does not drink alcohol and does not use drugs.  Review of Systems: Review of Systems  Constitutional: Negative for malaise/fatigue and weight loss.  HENT: Negative for hearing loss and tinnitus.   Eyes: Negative for blurred vision and double vision.  Respiratory: Negative for cough, sputum production, shortness of breath and wheezing.   Cardiovascular: Negative for chest pain, palpitations, orthopnea, claudication, leg swelling and PND.  Gastrointestinal: Negative for abdominal pain, blood in stool, constipation, diarrhea, heartburn, melena, nausea  and vomiting.  Genitourinary: Negative.   Musculoskeletal: Negative for falls, joint pain and myalgias.  Skin: Negative for rash.  Neurological: Negative for dizziness, tingling, sensory change and weakness.  Endo/Heme/Allergies: Negative for environmental allergies and polydipsia.  Psychiatric/Behavioral: Negative for depression, memory loss, substance abuse and suicidal ideas. The patient is nervous/anxious. The patient does not have insomnia.   All other systems reviewed and are negative.   Physical Exam: Estimated body mass index is 23.4 kg/m as calculated from the following:   Height as of 02/25/19: 5\' 6"  (1.676 m).   Weight as of this encounter: 145 lb  (65.8 kg). BP 130/88   Pulse 88   Temp 97.7 F (36.5 C)   Wt 145 lb (65.8 kg)   SpO2 97%   BMI 23.40 kg/m    General Appearance: Well nourished, in no apparent distress.  Eyes: PERRLA, EOMs, conjunctiva no swelling or erythema, defer fundal exam to vision provider Sinuses: No Frontal/maxillary tenderness  ENT/Mouth: Ext aud canals clear, normal light reflex with TMs without erythema, bulging. Good dentition. No erythema, swelling, or exudate on post pharynx. Tonsils not swollen or erythematous. Hearing normal.  Neck: Supple, thyroid normal. No bruits  Respiratory: Respiratory effort normal, BS equal bilaterally without rales, rhonchi, wheezing or stridor.  Cardio: RRR without murmurs, rubs or gallops. Brisk peripheral pulses without edema.  Chest: symmetric, with normal excursions and percussion.  Abdomen: Soft, nontender, no guarding, rebound, hernias, masses, or organomegaly.  Lymphatics: Non tender without lymphadenopathy.  Musculoskeletal: Full ROM all peripheral extremities,5/5 strength, and normal gait.  Skin: Warm, dry without rashes, lesions, ecchymosis. Neuro: Cranial nerves intact, reflexes equal bilaterally. Normal muscle tone, no cerebellar symptoms. Sensation intact.  Psych: Awake and oriented X 3, normal affect, Insight and Judgment appropriate.    Kendall Arnell 11:08 AM [facility]

## 2019-11-04 ENCOUNTER — Other Ambulatory Visit: Payer: Self-pay

## 2019-11-04 ENCOUNTER — Ambulatory Visit (INDEPENDENT_AMBULATORY_CARE_PROVIDER_SITE_OTHER): Payer: Managed Care, Other (non HMO) | Admitting: Adult Health

## 2019-11-04 ENCOUNTER — Encounter: Payer: Self-pay | Admitting: Adult Health

## 2019-11-04 VITALS — BP 130/88 | HR 88 | Temp 97.7°F | Wt 145.0 lb

## 2019-11-04 DIAGNOSIS — F909 Attention-deficit hyperactivity disorder, unspecified type: Secondary | ICD-10-CM | POA: Diagnosis not present

## 2019-11-04 DIAGNOSIS — Z6825 Body mass index (BMI) 25.0-25.9, adult: Secondary | ICD-10-CM

## 2019-11-04 DIAGNOSIS — F988 Other specified behavioral and emotional disorders with onset usually occurring in childhood and adolescence: Secondary | ICD-10-CM

## 2019-11-04 DIAGNOSIS — E0500 Thyrotoxicosis with diffuse goiter without thyrotoxic crisis or storm: Secondary | ICD-10-CM

## 2019-11-04 DIAGNOSIS — E559 Vitamin D deficiency, unspecified: Secondary | ICD-10-CM | POA: Diagnosis not present

## 2019-11-04 DIAGNOSIS — F411 Generalized anxiety disorder: Secondary | ICD-10-CM

## 2019-11-04 DIAGNOSIS — E8881 Metabolic syndrome: Secondary | ICD-10-CM | POA: Insufficient documentation

## 2019-11-04 DIAGNOSIS — E538 Deficiency of other specified B group vitamins: Secondary | ICD-10-CM | POA: Diagnosis not present

## 2019-11-04 DIAGNOSIS — Z6823 Body mass index (BMI) 23.0-23.9, adult: Secondary | ICD-10-CM

## 2019-11-04 DIAGNOSIS — Z79899 Other long term (current) drug therapy: Secondary | ICD-10-CM

## 2019-11-04 DIAGNOSIS — F419 Anxiety disorder, unspecified: Secondary | ICD-10-CM

## 2019-11-04 MED ORDER — VENLAFAXINE HCL ER 75 MG PO CP24: ORAL_CAPSULE | ORAL | 1 refills | Status: DC

## 2019-11-04 MED ORDER — MELOXICAM 15 MG PO TABS
15.0000 mg | ORAL_TABLET | Freq: Every day | ORAL | 0 refills | Status: DC
Start: 2019-11-04 — End: 2020-10-23

## 2019-11-04 MED ORDER — AMPHETAMINE-DEXTROAMPHETAMINE 30 MG PO TABS: ORAL_TABLET | ORAL | 0 refills | Status: DC

## 2019-11-04 NOTE — Patient Instructions (Addendum)
Goals    . Weight (lb) < 150 lb (68 kg)      Great job with losing weight and improving lifestyle! Keep up the excellent work :)  Try effexor 75 mg upping to 150 mg, if having any trouble, can alternate between the two doses for a few weeks before going up on the dose to 2 daily. Let me know if tolerating ok and can send in 150 mg caps next time     Managing Anxiety, Adult After being diagnosed with an anxiety disorder, you may be relieved to know why you have felt or behaved a certain way. You may also feel overwhelmed about the treatment ahead and what it will mean for your life. With care and support, you can manage this condition and recover from it. How to manage lifestyle changes Managing stress and anxiety  Stress is your body's reaction to life changes and events, both good and bad. Most stress will last just a few hours, but stress can be ongoing and can lead to more than just stress. Although stress can play a major role in anxiety, it is not the same as anxiety. Stress is usually caused by something external, such as a deadline, test, or competition. Stress normally passes after the triggering event has ended.  Anxiety is caused by something internal, such as imagining a terrible outcome or worrying that something will go wrong that will devastate you. Anxiety often does not go away even after the triggering event is over, and it can become long-term (chronic) worry. It is important to understand the differences between stress and anxiety and to manage your stress effectively so that it does not lead to an anxious response. Talk with your health care provider or a counselor to learn more about reducing anxiety and stress. He or she may suggest tension reduction techniques, such as:  Music therapy. This can include creating or listening to music that you enjoy and that inspires you.  Mindfulness-based meditation. This involves being aware of your normal breaths while not trying to  control your breathing. It can be done while sitting or walking.  Centering prayer. This involves focusing on a word, phrase, or sacred image that means something to you and brings you peace.  Deep breathing. To do this, expand your stomach and inhale slowly through your nose. Hold your breath for 3-5 seconds. Then exhale slowly, letting your stomach muscles relax.  Self-talk. This involves identifying thought patterns that lead to anxiety reactions and changing those patterns.  Muscle relaxation. This involves tensing muscles and then relaxing them. Choose a tension reduction technique that suits your lifestyle and personality. These techniques take time and practice. Set aside 5-15 minutes a day to do them. Therapists can offer counseling and training in these techniques. The training to help with anxiety may be covered by some insurance plans. Other things you can do to manage stress and anxiety include:  Keeping a stress/anxiety diary. This can help you learn what triggers your reaction and then learn ways to manage your response.  Thinking about how you react to certain situations. You may not be able to control everything, but you can control your response.  Making time for activities that help you relax and not feeling guilty about spending your time in this way.  Visual imagery and yoga can help you stay calm and relax.  Medicines Medicines can help ease symptoms. Medicines for anxiety include:  Anti-anxiety drugs.  Antidepressants. Medicines are often used as a  primary treatment for anxiety disorder. Medicines will be prescribed by a health care provider. When used together, medicines, psychotherapy, and tension reduction techniques may be the most effective treatment. Relationships Relationships can play a big part in helping you recover. Try to spend more time connecting with trusted friends and family members. Consider going to couples counseling, taking family education  classes, or going to family therapy. Therapy can help you and others better understand your condition. How to recognize changes in your anxiety Everyone responds differently to treatment for anxiety. Recovery from anxiety happens when symptoms decrease and stop interfering with your daily activities at home or work. This may mean that you will start to:  Have better concentration and focus. Worry will interfere less in your daily thinking.  Sleep better.  Be less irritable.  Have more energy.  Have improved memory. It is important to recognize when your condition is getting worse. Contact your health care provider if your symptoms interfere with home or work and you feel like your condition is not improving. Follow these instructions at home: Activity  Exercise. Most adults should do the following: ? Exercise for at least 150 minutes each week. The exercise should increase your heart rate and make you sweat (moderate-intensity exercise). ? Strengthening exercises at least twice a week.  Get the right amount and quality of sleep. Most adults need 7-9 hours of sleep each night. Lifestyle   Eat a healthy diet that includes plenty of vegetables, fruits, whole grains, low-fat dairy products, and lean protein. Do not eat a lot of foods that are high in solid fats, added sugars, or salt.  Make choices that simplify your life.  Do not use any products that contain nicotine or tobacco, such as cigarettes, e-cigarettes, and chewing tobacco. If you need help quitting, ask your health care provider.  Avoid caffeine, alcohol, and certain over-the-counter cold medicines. These may make you feel worse. Ask your pharmacist which medicines to avoid. General instructions  Take over-the-counter and prescription medicines only as told by your health care provider.  Keep all follow-up visits as told by your health care provider. This is important. Where to find support You can get help and support  from these sources:  Self-help groups.  Online and [employer].  A trusted spiritual leader.  Couples counseling.  Family education classes.  Family therapy. Where to find more information You may find that joining a support group helps you deal with your anxiety. The following sources can help you locate counselors or support groups near you:  Mental Health America: www.mentalhealthamerica.net  Anxiety and Depression Association of [place] (ADAA): ProgramCam.de  [employer] on Mental Illness (NAMI): www.nami.org Contact a health care provider if you:  Have a hard time staying focused or finishing daily tasks.  Spend many hours a day feeling worried about everyday life.  Become exhausted by worry.  Start to have headaches, feel tense, or have nausea.  Urinate more than normal.  Have diarrhea. Get help right away if you have:  A racing heart and shortness of breath.  Thoughts of hurting yourself or others. If you ever feel like you may hurt yourself or others, or have thoughts about taking your own life, get help right away. You can go to your nearest emergency department or call:  Your local emergency services (911 in the [place]  A suicide crisis helpline, such as the National Suicide Prevention Lifeline at 3144917035. This is open 24 hours a day. Summary  Taking steps to  learn and use tension reduction techniques can help calm you and help prevent triggering an anxiety reaction.  When used together, medicines, psychotherapy, and tension reduction techniques may be the most effective treatment.  Family, friends, and partners can play a big part in helping you recover from an anxiety disorder. This information is not intended to replace advice given to you by your health care provider. Make sure you discuss any questions you have with your health care provider. Document Revised: 08/19/2018 Document Reviewed: 08/19/2018 Elsevier Patient  Education  2020 [employer].

## 2019-11-05 ENCOUNTER — Other Ambulatory Visit: Payer: Self-pay | Admitting: Adult Health

## 2019-11-05 LAB — TSH: TSH: 0.7 mIU/L

## 2019-11-05 LAB — INSULIN, RANDOM: Insulin: 8.5 u[IU]/mL

## 2019-11-05 LAB — COMPLETE METABOLIC PANEL WITH GFR
AG Ratio: 1.6 (calc) (ref 1.0–2.5)
ALT: 6 U/L (ref 6–29)
AST: 11 U/L (ref 10–30)
Albumin: 4.6 g/dL (ref 3.6–5.1)
Alkaline phosphatase (APISO): 68 U/L (ref 31–125)
BUN: 12 mg/dL (ref 7–25)
CO2: 25 mmol/L (ref 20–32)
Calcium: 9.4 mg/dL (ref 8.6–10.2)
Chloride: 107 mmol/L (ref 98–110)
Creat: 0.78 mg/dL (ref 0.50–1.10)
GFR, Est African American: 116 mL/min/{1.73_m2} (ref >=60)
GFR, Est Non African American: 100 mL/min/{1.73_m2} (ref >=60)
Globulin: 2.8 g/dL (calc) (ref 1.9–3.7)
Glucose, Bld: 95 mg/dL (ref 65–99)
Potassium: 4.5 mmol/L (ref 3.5–5.3)
Sodium: 140 mmol/L (ref 135–146)
Total Bilirubin: 0.4 mg/dL (ref 0.2–1.2)
Total Protein: 7.4 g/dL (ref 6.1–8.1)

## 2019-11-05 LAB — VITAMIN B12: Vitamin B-12: 456 pg/mL (ref 200–1100)

## 2019-11-05 LAB — VITAMIN D 25 HYDROXY (VIT D DEFICIENCY, FRACTURES): Vit D, 25-Hydroxy: 10 ng/mL — ABNORMAL LOW (ref 30–100)

## 2019-11-05 MED ORDER — VITAMIN D (ERGOCALCIFEROL) 1.25 MG (50000 UNIT) PO CAPS: ORAL_CAPSULE | ORAL | 1 refills | Status: DC

## 2019-11-18 ENCOUNTER — Telehealth: Payer: Self-pay | Admitting: Adult Health

## 2019-11-18 MED ORDER — VITAMIN D (ERGOCALCIFEROL) 1.25 MG (50000 UNIT) PO CAPS: ORAL_CAPSULE | ORAL | 1 refills | Status: DC

## 2019-11-18 NOTE — Telephone Encounter (Signed)
Please resend Vitamin D to [facility]

## 2019-11-18 NOTE — Addendum Note (Signed)
Addended by: Dionicio Stall on: 11/18/2019 02:02 PM   Modules accepted: Orders

## 2019-11-18 NOTE — Telephone Encounter (Signed)
please resend Vit D rx to [facility] [address]

## 2019-12-08 ENCOUNTER — Other Ambulatory Visit: Payer: Self-pay

## 2019-12-08 DIAGNOSIS — F909 Attention-deficit hyperactivity disorder, unspecified type: Secondary | ICD-10-CM

## 2019-12-08 DIAGNOSIS — F988 Other specified behavioral and emotional disorders with onset usually occurring in childhood and adolescence: Secondary | ICD-10-CM

## 2019-12-09 MED ORDER — AMPHETAMINE-DEXTROAMPHETAMINE 30 MG PO TABS: ORAL_TABLET | ORAL | 0 refills | Status: DC

## 2020-01-11 ENCOUNTER — Encounter (HOSPITAL_COMMUNITY): Payer: Self-pay | Admitting: *Deleted

## 2020-01-11 ENCOUNTER — Encounter (HOSPITAL_COMMUNITY): Payer: Self-pay

## 2020-01-11 ENCOUNTER — Other Ambulatory Visit: Payer: Self-pay

## 2020-01-11 DIAGNOSIS — F909 Attention-deficit hyperactivity disorder, unspecified type: Secondary | ICD-10-CM

## 2020-01-11 DIAGNOSIS — F988 Other specified behavioral and emotional disorders with onset usually occurring in childhood and adolescence: Secondary | ICD-10-CM

## 2020-01-11 MED ORDER — AMPHETAMINE-DEXTROAMPHETAMINE 30 MG PO TABS: ORAL_TABLET | ORAL | 0 refills | Status: DC

## 2020-01-11 NOTE — Patient Instructions (Signed)
Stacy Mueller  01/11/2020   Your procedure is scheduled on:  01/25/2020  Arrive at 1030 at Entrance C on [employer] at [facility]  and [employer]. You are invited to use the FREE valet parking or use the Visitor's parking deck.  Pick up the phone at the desk and dial [phone].  Call this number if you have problems the morning of surgery: [phone]  Remember:   Do not eat food:(After Midnight) Desps de medianoche.  Do not drink clear liquids: (After Midnight) Desps de medianoche.  Take these medicines the morning of surgery with A SIP OF WATER:  none   Do not wear jewelry, make-up or nail polish.  Do not wear lotions, powders, or perfumes. Do not wear deodorant.  Do not shave 48 hours prior to surgery.  Do not bring valuables to the hospital.  [facility] is not   responsible for any belongings or valuables brought to the hospital.  Contacts, dentures or bridgework may not be worn into surgery.  Leave suitcase in the car. After surgery it may be brought to your room.  For patients admitted to the hospital, checkout time is 11:00 AM the day of              discharge.      Please read over the following fact sheets that you were given:     Preparing for Surgery

## 2020-01-12 ENCOUNTER — Encounter (HOSPITAL_COMMUNITY): Payer: Self-pay

## 2020-01-12 ENCOUNTER — Other Ambulatory Visit: Payer: Self-pay | Admitting: Adult Health

## 2020-01-12 ENCOUNTER — Other Ambulatory Visit: Payer: Self-pay

## 2020-01-13 ENCOUNTER — Other Ambulatory Visit: Payer: Self-pay | Admitting: Adult Health

## 2020-01-13 DIAGNOSIS — F419 Anxiety disorder, unspecified: Secondary | ICD-10-CM

## 2020-01-13 DIAGNOSIS — F411 Generalized anxiety disorder: Secondary | ICD-10-CM

## 2020-01-13 MED ORDER — VENLAFAXINE HCL ER 150 MG PO CP24: ORAL_CAPSULE | ORAL | 1 refills | Status: DC

## 2020-01-13 MED ORDER — ALPRAZOLAM 0.5 MG PO TABS: ORAL_TABLET | ORAL | 0 refills | Status: DC

## 2020-01-15 ENCOUNTER — Other Ambulatory Visit: Payer: Self-pay | Admitting: Obstetrics and Gynecology

## 2020-01-22 ENCOUNTER — Other Ambulatory Visit: Payer: Self-pay

## 2020-01-22 ENCOUNTER — Other Ambulatory Visit (HOSPITAL_COMMUNITY)
Admission: RE | Admit: 2020-01-22 | Discharge: 2020-01-22 | Disposition: A | Discharge status: Home / Self Care | Payer: 59 | Source: Ambulatory Visit | Attending: Obstetrics and Gynecology | Admitting: Obstetrics and Gynecology

## 2020-01-22 DIAGNOSIS — Z20822 Contact with and (suspected) exposure to covid-19: Secondary | ICD-10-CM | POA: Insufficient documentation

## 2020-01-22 DIAGNOSIS — Z01812 Encounter for preprocedural laboratory examination: Secondary | ICD-10-CM | POA: Insufficient documentation

## 2020-01-22 HISTORY — DX: Unspecified abnormal cytological findings in specimens from vagina: R87.629

## 2020-01-22 LAB — CBC
HCT: 35.7 % — ABNORMAL LOW (ref 36.0–46.0)
Hemoglobin: 11.6 g/dL — ABNORMAL LOW (ref 12.0–15.0)
MCH: 30.3 pg (ref 26.0–34.0)
MCHC: 32.5 g/dL (ref 30.0–36.0)
MCV: 93.2 fL (ref 80.0–100.0)
Platelets: 147 10*3/uL — ABNORMAL LOW (ref 150–400)
RBC: 3.83 MIL/uL — ABNORMAL LOW (ref 3.87–5.11)
RDW: 13.5 % (ref 11.5–15.5)
WBC: 11.1 10*3/uL — ABNORMAL HIGH (ref 4.0–10.5)
nRBC: 0 % (ref 0.0–0.2)

## 2020-01-22 LAB — SARS CORONAVIRUS 2 (TAT 6-24 HRS): SARS Coronavirus 2: NEGATIVE

## 2020-01-23 LAB — RPR: RPR Ser Ql: NONREACTIVE

## 2020-01-24 NOTE — H&P (Signed)
Stacy Mueller is a 34 y.o. female presenting for primary csection for complete placenta previa. OB History    Gravida  2   Para  1   Term  1   Preterm      AB      Living  1     SAB      TAB      Ectopic      Multiple      Live Births  1          Past Medical History:  Diagnosis Date  . ADD (attention deficit disorder)   . Anxiety   . GERD (gastroesophageal reflux disease)   . Graves disease   . Vaginal Pap smear, abnormal   . Vitamin B 12 deficiency   . Vitamin D deficiency    Past Surgical History:  Procedure Laterality Date  . LEEP  2011   Family History: family history includes Hypertension in her father, maternal grandfather, maternal grandmother, mother, paternal grandfather, and paternal grandmother. Social History:  reports that she has never smoked. She has never used smokeless tobacco. She reports that she does not drink alcohol and does not use drugs.     Maternal Diabetes: No Genetic Screening: Normal Maternal Ultrasounds/Referrals: Normal Fetal Ultrasounds or other Referrals:  None Maternal Substance Abuse:  No Significant Maternal Medications:  None Significant Maternal Lab Results:  Group B Strep negative Other Comments:  None  Review of Systems History   There were no vitals taken for this visit. Maternal Exam:  Uterine Assessment: Contraction strength is mild.  Contraction frequency is rare.   Abdomen: Patient reports no abdominal tenderness. Fetal presentation: vertex  Introitus: Normal vulva. Normal vagina.  Nitrazine test: not done. Amniotic fluid character: not assessed.  Pelvis: adequate for delivery.   Cervix: Cervix evaluated by sterile speculum exam.     Physical Exam Vitals and nursing note reviewed.  Constitutional:      Appearance: Normal appearance.  HENT:     Head: Normocephalic and atraumatic.  Cardiovascular:     Rate and Rhythm: Regular rhythm.  Pulmonary:     Effort: Pulmonary effort is normal.      Breath sounds: Normal breath sounds.  Abdominal:     Palpations: Abdomen is soft.  Genitourinary:    General: Normal vulva.  Musculoskeletal:        General: Normal range of motion.     Cervical back: Neck supple.  Skin:    General: Skin is warm and dry.  Neurological:     General: No focal deficit present.     Mental Status: She is alert and oriented to person, place, and time.  Psychiatric:        Mood and Affect: Mood normal.        Behavior: Behavior normal.     Prenatal labs: ABO, Rh: --/--/A POS (10/22 0957) Antibody: NEG (10/22 0957) Rubella: Immune (04/13 0000) RPR: NON REACTIVE (10/22 0949)  HBsAg: Negative (04/13 0000)  HIV: Non-reactive (04/13 0000)  GBS:   neg Assessment/Plan: 37 wk IUP Complete Previa BMZ complete Primary csection. Risks of surgery, bleeding , injury to surrounding organs discussed. Need for blood products and associated risks noted. Possible need for hysterectomy noted. Fe transfusion decline.   Marvell Tamer J 01/24/2020, 9:18 AM

## 2020-01-25 ENCOUNTER — Encounter (HOSPITAL_COMMUNITY)
Admission: RE | Disposition: A | Discharge status: Home / Self Care | Payer: Self-pay | Source: Home / Self Care | Attending: Obstetrics and Gynecology

## 2020-01-25 ENCOUNTER — Inpatient Hospital Stay (HOSPITAL_COMMUNITY): Payer: 59 | Admitting: Certified Registered Nurse Anesthetist

## 2020-01-25 ENCOUNTER — Encounter (HOSPITAL_COMMUNITY): Payer: Self-pay | Admitting: Obstetrics and Gynecology

## 2020-01-25 ENCOUNTER — Telehealth (HOSPITAL_COMMUNITY): Payer: Self-pay | Admitting: *Deleted

## 2020-01-25 ENCOUNTER — Inpatient Hospital Stay (HOSPITAL_COMMUNITY)
Admission: RE | Admit: 2020-01-25 | Discharge: 2020-01-27 | DRG: 787 | Disposition: A | Discharge status: Home / Self Care | Payer: 59 | Attending: Obstetrics and Gynecology | Admitting: Obstetrics and Gynecology

## 2020-01-25 ENCOUNTER — Other Ambulatory Visit: Payer: Self-pay

## 2020-01-25 ENCOUNTER — Encounter (HOSPITAL_COMMUNITY): Payer: Self-pay

## 2020-01-25 DIAGNOSIS — O99119 Other diseases of the blood and blood-forming organs and certain disorders involving the immune mechanism complicating pregnancy, unspecified trimester: Secondary | ICD-10-CM

## 2020-01-25 DIAGNOSIS — Z3A37 37 weeks gestation of pregnancy: Secondary | ICD-10-CM | POA: Diagnosis not present

## 2020-01-25 DIAGNOSIS — D6959 Other secondary thrombocytopenia: Secondary | ICD-10-CM | POA: Diagnosis present

## 2020-01-25 DIAGNOSIS — O9081 Anemia of the puerperium: Secondary | ICD-10-CM | POA: Diagnosis not present

## 2020-01-25 DIAGNOSIS — D62 Acute posthemorrhagic anemia: Secondary | ICD-10-CM | POA: Diagnosis not present

## 2020-01-25 DIAGNOSIS — D696 Thrombocytopenia, unspecified: Secondary | ICD-10-CM

## 2020-01-25 DIAGNOSIS — Z23 Encounter for immunization: Secondary | ICD-10-CM | POA: Diagnosis not present

## 2020-01-25 DIAGNOSIS — O4400 Placenta previa specified as without hemorrhage, unspecified trimester: Secondary | ICD-10-CM | POA: Diagnosis present

## 2020-01-25 DIAGNOSIS — O9912 Other diseases of the blood and blood-forming organs and certain disorders involving the immune mechanism complicating childbirth: Secondary | ICD-10-CM | POA: Diagnosis present

## 2020-01-25 DIAGNOSIS — O4403 Placenta previa specified as without hemorrhage, third trimester: Secondary | ICD-10-CM | POA: Diagnosis present

## 2020-01-25 DIAGNOSIS — Z20822 Contact with and (suspected) exposure to covid-19: Secondary | ICD-10-CM | POA: Diagnosis present

## 2020-01-25 DIAGNOSIS — Z98891 History of uterine scar from previous surgery: Secondary | ICD-10-CM

## 2020-01-25 LAB — ABO/RH: ABO/RH(D): A POS

## 2020-01-25 LAB — PREPARE RBC (CROSSMATCH)

## 2020-01-25 SURGERY — Surgical Case
Anesthesia: Spinal

## 2020-01-25 MED ORDER — ACETAMINOPHEN 10 MG/ML IV SOLN
1000.0000 mg | Freq: Once | INTRAVENOUS | Status: DC | PRN
  Administered 2020-01-25: 1000 mg via INTRAVENOUS

## 2020-01-25 MED ORDER — NALBUPHINE HCL 10 MG/ML IJ SOLN: 5.0000 mg | Freq: Once | INTRAMUSCULAR | Status: DC | PRN

## 2020-01-25 MED ORDER — DEXAMETHASONE SODIUM PHOSPHATE 10 MG/ML IJ SOLN
INTRAMUSCULAR | Status: DC | PRN
  Administered 2020-01-25: 4 mg via INTRAVENOUS

## 2020-01-25 MED ORDER — VASOPRESSIN 20 UNIT/ML IV SOLN
INTRAVENOUS | Status: AC
  Filled 2020-01-25: qty 1

## 2020-01-25 MED ORDER — ACETAMINOPHEN 10 MG/ML IV SOLN
INTRAVENOUS | Status: AC
  Filled 2020-01-25: qty 100

## 2020-01-25 MED ORDER — VASOPRESSIN 20 UNIT/ML IV SOLN
10.0000 [IU] | Freq: Once | INTRAVENOUS | Status: DC
  Filled 2020-01-25: qty 0.5

## 2020-01-25 MED ORDER — DIPHENHYDRAMINE HCL 25 MG PO CAPS: 25.0000 mg | ORAL_CAPSULE | Freq: Four times a day (QID) | ORAL | Status: DC | PRN

## 2020-01-25 MED ORDER — NALBUPHINE HCL 10 MG/ML IJ SOLN: 5.0000 mg | INTRAMUSCULAR | Status: DC | PRN

## 2020-01-25 MED ORDER — LACTATED RINGERS IV SOLN: INTRAVENOUS | Status: DC

## 2020-01-25 MED ORDER — ZOLPIDEM TARTRATE 5 MG PO TABS: 5.0000 mg | ORAL_TABLET | Freq: Every evening | ORAL | Status: DC | PRN

## 2020-01-25 MED ORDER — NALOXONE HCL 0.4 MG/ML IJ SOLN: 0.4000 mg | INTRAMUSCULAR | Status: DC | PRN

## 2020-01-25 MED ORDER — OXYCODONE-ACETAMINOPHEN 5-325 MG PO TABS: 1.0000 | ORAL_TABLET | ORAL | Status: DC | PRN

## 2020-01-25 MED ORDER — DIBUCAINE (PERIANAL) 1 % EX OINT: 1.0000 "application " | TOPICAL_OINTMENT | CUTANEOUS | Status: DC | PRN

## 2020-01-25 MED ORDER — KETOROLAC TROMETHAMINE 30 MG/ML IJ SOLN: 30.0000 mg | Freq: Four times a day (QID) | INTRAMUSCULAR | Status: DC | PRN

## 2020-01-25 MED ORDER — MORPHINE SULFATE (PF) 0.5 MG/ML IJ SOLN
INTRAMUSCULAR | Status: DC | PRN
Start: 2020-01-25 — End: 2020-01-25
  Administered 2020-01-25: .15 mg via EPIDURAL

## 2020-01-25 MED ORDER — ACETAMINOPHEN 500 MG PO TABS
1000.0000 mg | ORAL_TABLET | Freq: Four times a day (QID) | ORAL | Status: AC
  Administered 2020-01-25 – 2020-01-26 (×3): 1000 mg via ORAL
  Filled 2020-01-25 (×3): qty 2

## 2020-01-25 MED ORDER — POVIDONE-IODINE 10 % EX SWAB
2.0000 "application " | Freq: Once | CUTANEOUS | Status: AC
  Administered 2020-01-25: 2 via TOPICAL

## 2020-01-25 MED ORDER — METHYLERGONOVINE MALEATE 0.2 MG PO TABS: 0.2000 mg | ORAL_TABLET | ORAL | Status: DC | PRN

## 2020-01-25 MED ORDER — COCONUT OIL OIL
1.0000 "application " | TOPICAL_OIL | Status: DC | PRN
  Administered 2020-01-26: 1 via TOPICAL

## 2020-01-25 MED ORDER — TRANEXAMIC ACID-NACL 1000-0.7 MG/100ML-% IV SOLN
INTRAVENOUS | Status: AC
  Filled 2020-01-25: qty 100

## 2020-01-25 MED ORDER — MEPERIDINE HCL 25 MG/ML IJ SOLN
INTRAMUSCULAR | Status: DC | PRN
Start: 2020-01-25 — End: 2020-01-25
  Administered 2020-01-25 (×2): 12.5 mg via INTRAVENOUS

## 2020-01-25 MED ORDER — ONDANSETRON HCL 4 MG/2ML IJ SOLN
INTRAMUSCULAR | Status: DC | PRN
  Administered 2020-01-25: 4 mg via INTRAVENOUS

## 2020-01-25 MED ORDER — SIMETHICONE 80 MG PO CHEW: 80.0000 mg | CHEWABLE_TABLET | ORAL | Status: DC | PRN

## 2020-01-25 MED ORDER — FENTANYL CITRATE (PF) 100 MCG/2ML IJ SOLN
INTRAMUSCULAR | Status: AC
  Filled 2020-01-25: qty 2

## 2020-01-25 MED ORDER — CARBOPROST TROMETHAMINE 250 MCG/ML IM SOLN
INTRAMUSCULAR | Status: AC
  Filled 2020-01-25: qty 1

## 2020-01-25 MED ORDER — IBUPROFEN 800 MG PO TABS
800.0000 mg | ORAL_TABLET | Freq: Four times a day (QID) | ORAL | Status: DC
  Administered 2020-01-26 – 2020-01-27 (×5): 800 mg via ORAL
  Filled 2020-01-25 (×5): qty 1

## 2020-01-25 MED ORDER — ONDANSETRON HCL 4 MG/2ML IJ SOLN: 4.0000 mg | Freq: Three times a day (TID) | INTRAMUSCULAR | Status: DC | PRN

## 2020-01-25 MED ORDER — OXYTOCIN-SODIUM CHLORIDE 30-0.9 UT/500ML-% IV SOLN
INTRAVENOUS | Status: DC | PRN
  Administered 2020-01-25: 30 [IU] via INTRAVENOUS

## 2020-01-25 MED ORDER — MENTHOL 3 MG MT LOZG: 1.0000 | LOZENGE | OROMUCOSAL | Status: DC | PRN

## 2020-01-25 MED ORDER — SODIUM CHLORIDE 0.9% FLUSH: 3.0000 mL | INTRAVENOUS | Status: DC | PRN

## 2020-01-25 MED ORDER — DIPHENHYDRAMINE HCL 50 MG/ML IJ SOLN: 12.5000 mg | INTRAMUSCULAR | Status: DC | PRN

## 2020-01-25 MED ORDER — PRENATAL MULTIVITAMIN CH
1.0000 | ORAL_TABLET | Freq: Every day | ORAL | Status: DC
  Administered 2020-01-26 – 2020-01-27 (×2): 1 via ORAL
  Filled 2020-01-25 (×2): qty 1

## 2020-01-25 MED ORDER — DEXMEDETOMIDINE (PRECEDEX) IN NS 20 MCG/5ML (4 MCG/ML) IV SYRINGE
PREFILLED_SYRINGE | INTRAVENOUS | Status: AC
  Filled 2020-01-25: qty 5

## 2020-01-25 MED ORDER — DEXMEDETOMIDINE (PRECEDEX) IN NS 20 MCG/5ML (4 MCG/ML) IV SYRINGE
PREFILLED_SYRINGE | INTRAVENOUS | Status: DC | PRN
  Administered 2020-01-25: 8 ug via INTRAVENOUS
  Administered 2020-01-25 (×2): 4 ug via INTRAVENOUS

## 2020-01-25 MED ORDER — VASOPRESSIN 20 UNIT/ML IV SOLN
INTRAVENOUS | Status: DC | PRN
  Administered 2020-01-25: 40 m[IU]

## 2020-01-25 MED ORDER — FENTANYL CITRATE (PF) 100 MCG/2ML IJ SOLN
INTRAMUSCULAR | Status: DC | PRN
  Administered 2020-01-25: 15 ug via INTRAVENOUS

## 2020-01-25 MED ORDER — CEFAZOLIN SODIUM-DEXTROSE 2-4 GM/100ML-% IV SOLN
INTRAVENOUS | Status: AC
  Filled 2020-01-25: qty 100

## 2020-01-25 MED ORDER — DIPHENHYDRAMINE HCL 25 MG PO CAPS: 25.0000 mg | ORAL_CAPSULE | ORAL | Status: DC | PRN

## 2020-01-25 MED ORDER — PHENYLEPHRINE HCL-NACL 20-0.9 MG/250ML-% IV SOLN
INTRAVENOUS | Status: DC | PRN
  Administered 2020-01-25: 60 ug/min via INTRAVENOUS

## 2020-01-25 MED ORDER — WITCH HAZEL-GLYCERIN EX PADS: 1.0000 "application " | MEDICATED_PAD | CUTANEOUS | Status: DC | PRN

## 2020-01-25 MED ORDER — KETOROLAC TROMETHAMINE 30 MG/ML IJ SOLN
INTRAMUSCULAR | Status: AC
  Filled 2020-01-25: qty 1

## 2020-01-25 MED ORDER — METHYLERGONOVINE MALEATE 0.2 MG/ML IJ SOLN: 0.2000 mg | INTRAMUSCULAR | Status: DC | PRN

## 2020-01-25 MED ORDER — SIMETHICONE 80 MG PO CHEW
80.0000 mg | CHEWABLE_TABLET | Freq: Three times a day (TID) | ORAL | Status: DC
  Administered 2020-01-25 – 2020-01-27 (×5): 80 mg via ORAL
  Filled 2020-01-25 (×5): qty 1

## 2020-01-25 MED ORDER — KETOROLAC TROMETHAMINE 30 MG/ML IJ SOLN
30.0000 mg | Freq: Four times a day (QID) | INTRAMUSCULAR | Status: AC
  Administered 2020-01-26 (×2): 30 mg via INTRAVENOUS
  Filled 2020-01-25 (×2): qty 1

## 2020-01-25 MED ORDER — TRANEXAMIC ACID-NACL 1000-0.7 MG/100ML-% IV SOLN
INTRAVENOUS | Status: DC | PRN
  Administered 2020-01-25: 1000 mg via INTRAVENOUS

## 2020-01-25 MED ORDER — SODIUM CHLORIDE (PF) 0.9 % IJ SOLN
INTRAMUSCULAR | Status: AC
  Filled 2020-01-25: qty 50

## 2020-01-25 MED ORDER — FENTANYL CITRATE (PF) 100 MCG/2ML IJ SOLN: 25.0000 ug | INTRAMUSCULAR | Status: DC | PRN

## 2020-01-25 MED ORDER — BUPIVACAINE IN DEXTROSE 0.75-8.25 % IT SOLN
INTRATHECAL | Status: DC | PRN
  Administered 2020-01-25: 1.6 mL via INTRATHECAL

## 2020-01-25 MED ORDER — TETANUS-DIPHTH-ACELL PERTUSSIS 5-2.5-18.5 LF-MCG/0.5 IM SUSY: 0.5000 mL | PREFILLED_SYRINGE | Freq: Once | INTRAMUSCULAR | Status: DC

## 2020-01-25 MED ORDER — OXYTOCIN-SODIUM CHLORIDE 30-0.9 UT/500ML-% IV SOLN: 2.5000 [IU]/h | INTRAVENOUS | Status: AC

## 2020-01-25 MED ORDER — BUPIVACAINE HCL (PF) 0.25 % IJ SOLN
INTRAMUSCULAR | Status: DC | PRN
  Administered 2020-01-25: 20 mL

## 2020-01-25 MED ORDER — MEPERIDINE HCL 25 MG/ML IJ SOLN
INTRAMUSCULAR | Status: AC
  Filled 2020-01-25: qty 1

## 2020-01-25 MED ORDER — OXYTOCIN-SODIUM CHLORIDE 30-0.9 UT/500ML-% IV SOLN
INTRAVENOUS | Status: AC
  Filled 2020-01-25: qty 500

## 2020-01-25 MED ORDER — SODIUM CHLORIDE 0.9 % IV SOLN: INTRAVENOUS | Status: DC | PRN

## 2020-01-25 MED ORDER — BUPIVACAINE HCL (PF) 0.25 % IJ SOLN
INTRAMUSCULAR | Status: AC
  Filled 2020-01-25: qty 20

## 2020-01-25 MED ORDER — SENNOSIDES-DOCUSATE SODIUM 8.6-50 MG PO TABS
2.0000 | ORAL_TABLET | ORAL | Status: DC
  Administered 2020-01-26 (×2): 2 via ORAL
  Filled 2020-01-25 (×2): qty 2

## 2020-01-25 MED ORDER — INFLUENZA VAC SPLIT QUAD 0.5 ML IM SUSY
0.5000 mL | PREFILLED_SYRINGE | INTRAMUSCULAR | Status: AC
  Administered 2020-01-26: 0.5 mL via INTRAMUSCULAR
  Filled 2020-01-25: qty 0.5

## 2020-01-25 MED ORDER — SCOPOLAMINE 1 MG/3DAYS TD PT72: 1.0000 | MEDICATED_PATCH | Freq: Once | TRANSDERMAL | Status: DC

## 2020-01-25 MED ORDER — MORPHINE SULFATE (PF) 0.5 MG/ML IJ SOLN
INTRAMUSCULAR | Status: AC
  Filled 2020-01-25: qty 10

## 2020-01-25 MED ORDER — CEFAZOLIN SODIUM-DEXTROSE 2-4 GM/100ML-% IV SOLN
2.0000 g | INTRAVENOUS | Status: AC
  Administered 2020-01-25: 2 g via INTRAVENOUS

## 2020-01-25 MED ORDER — SIMETHICONE 80 MG PO CHEW
80.0000 mg | CHEWABLE_TABLET | ORAL | Status: DC
  Administered 2020-01-26 (×2): 80 mg via ORAL
  Filled 2020-01-25 (×2): qty 1

## 2020-01-25 MED ORDER — KETOROLAC TROMETHAMINE 30 MG/ML IJ SOLN
30.0000 mg | Freq: Once | INTRAMUSCULAR | Status: AC | PRN
  Administered 2020-01-25: 30 mg via INTRAVENOUS

## 2020-01-25 MED ORDER — NALOXONE HCL 4 MG/10ML IJ SOLN
1.0000 ug/kg/h | INTRAVENOUS | Status: DC | PRN
  Filled 2020-01-25: qty 5

## 2020-01-25 SURGICAL SUPPLY — 37 items
APL SKNCLS STERI-STRIP NONHPOA (GAUZE/BANDAGES/DRESSINGS) ×1
BENZOIN TINCTURE PRP APPL 2/3 (GAUZE/BANDAGES/DRESSINGS) ×2 IMPLANT
CHLORAPREP W/TINT 26ML (MISCELLANEOUS) ×2 IMPLANT
CLAMP CORD UMBIL (MISCELLANEOUS) IMPLANT
CLOSURE STERI STRIP 1/2 X4 (GAUZE/BANDAGES/DRESSINGS) ×2 IMPLANT
CLOTH BEACON ORANGE TIMEOUT ST (SAFETY) ×2 IMPLANT
DRSG OPSITE POSTOP 4X10 (GAUZE/BANDAGES/DRESSINGS) ×2 IMPLANT
ELECT REM PT RETURN 9FT ADLT (ELECTROSURGICAL) ×2
ELECTRODE REM PT RTRN 9FT ADLT (ELECTROSURGICAL) ×1 IMPLANT
EXTRACTOR VACUUM M CUP 4 TUBE (SUCTIONS) IMPLANT
GLOVE BIO SURGEON STRL SZ7.5 (GLOVE) ×2 IMPLANT
GLOVE BIOGEL PI IND STRL 7.0 (GLOVE) ×1 IMPLANT
GLOVE BIOGEL PI INDICATOR 7.0 (GLOVE) ×1
GOWN STRL REUS W/TWL LRG LVL3 (GOWN DISPOSABLE) ×4 IMPLANT
KIT ABG SYR 3ML LUER SLIP (SYRINGE) IMPLANT
NEEDLE HYPO 22GX1.5 SAFETY (NEEDLE) ×2 IMPLANT
NEEDLE HYPO 25X5/8 SAFETYGLIDE (NEEDLE) IMPLANT
NEEDLE SPNL 20GX3.5 QUINCKE YW (NEEDLE) IMPLANT
NS IRRIG 1000ML POUR BTL (IV SOLUTION) ×2 IMPLANT
PACK C SECTION WH (CUSTOM PROCEDURE TRAY) ×2 IMPLANT
PENCIL SMOKE EVAC W/HOLSTER (ELECTROSURGICAL) ×2 IMPLANT
SUT CHROMIC 0 CTX 36 (SUTURE) ×2 IMPLANT
SUT MNCRL 0 VIOLET CTX 36 (SUTURE) ×2 IMPLANT
SUT MNCRL AB 3-0 PS2 27 (SUTURE) IMPLANT
SUT MON AB 2-0 CT1 27 (SUTURE) ×2 IMPLANT
SUT MON AB-0 CT1 36 (SUTURE) ×4 IMPLANT
SUT MONOCRYL 0 CTX 36 (SUTURE) ×4
SUT PLAIN 0 NONE (SUTURE) IMPLANT
SUT PLAIN 2 0 (SUTURE)
SUT PLAIN 2 0 XLH (SUTURE) ×2 IMPLANT
SUT PLAIN ABS 2-0 CT1 27XMFL (SUTURE) IMPLANT
SUT VIC AB 4-0 KS 27 (SUTURE) ×2 IMPLANT
SYR 20CC LL (SYRINGE) IMPLANT
SYR CONTROL 10ML LL (SYRINGE) ×2 IMPLANT
TOWEL OR 17X24 6PK STRL BLUE (TOWEL DISPOSABLE) ×2 IMPLANT
TRAY FOLEY W/BAG SLVR 14FR LF (SET/KITS/TRAYS/PACK) ×2 IMPLANT
WATER STERILE IRR 1000ML POUR (IV SOLUTION) ×2 IMPLANT

## 2020-01-25 NOTE — Progress Notes (Signed)
Patient admitted to standing at bedside without assistance to change her pad. She said she did not walk. Patient re-educated on fall safety and to call RN before getting OOB. Royston Cowper, RN

## 2020-01-25 NOTE — Transfer of Care (Signed)
Immediate Anesthesia Transfer of Care Note  Patient: Stacy Mueller  Procedure(s) Performed: Primary CESAREAN SECTION (N/A )  Patient Location: PACU  Anesthesia Type:Spinal  Level of Consciousness: awake, alert , oriented and patient cooperative  Airway & Oxygen Therapy: Patient Spontanous Breathing  Post-op Assessment: Report given to RN and Post -op Vital signs reviewed and stable  Post vital signs: Reviewed and stable  Last Vitals:  Vitals Value Taken Time  BP    Temp    Pulse    Resp    SpO2      Last Pain:  Vitals:   01/25/20 1111  TempSrc: Oral         Complications: No complications documented.

## 2020-01-25 NOTE — Op Note (Signed)
Cesarean Section Procedure Note  Indications: placenta previa  Pre-operative Diagnosis: 37 week 0 day pregnancy.  Post-operative Diagnosis: same  Surgeon: Lenoard Aden   Assistants: Renae Fickle, CNM  Anesthesia: Local anesthesia 0.25.% bupivacaine and Spinal anesthesia  ASA Class: 2  Procedure Details  The patient was seen in the Holding Room. The risks, benefits, complications, treatment options, and expected outcomes were discussed with the patient.  The patient concurred with the proposed plan, giving informed consent. The risks of anesthesia, infection, bleeding and possible injury to other organs discussed. Injury to bowel, bladder, or ureter with possible need for repair discussed. Possible need for transfusion with secondary risks of hepatitis or HIV acquisition discussed. Post operative complications to include but not limited to DVT, PE and Pneumonia noted. The site of surgery properly noted/marked. The patient was taken to Operating Room # B, identified as Stacy Mueller and the procedure verified as C-Section Delivery. A Time Out was held and the above information confirmed.  After induction of anesthesia, the patient was draped and prepped in the usual sterile manner. A Pfannenstiel incision was made and carried down through the subcutaneous tissue to the fascia. Fascial incision was made and extended transversely using Mayo scissors. The fascia was separated from the underlying rectus tissue superiorly and inferiorly. The peritoneum was identified and entered. Peritoneal incision was extended longitudinally. The utero-vesical peritoneal reflection was incised transversely and the bladder flap was bluntly freed from the lower uterine segment. A low transverse uterine incision(Kerr hysterotomy) was made. Delivered from OA presentation was a  female with Apgar scores of 8 at one minute and 9 at five minutes. Bulb suctioning gently performed. Neonatal team in attendance.After the  umbilical cord was clamped and cut cord blood was obtained for evaluation. The placenta was removed intact from posterior previa location and appeared normal. The uterus was curetted with a dry lap pack. Good hemostasis was noted using direct myometrial injection of dilute pitressin solution and interrupted box sutures of 0 chromic in posterior endometrium.The uterine outline, tubes and ovaries appeared normal. The uterine incision was closed with running locked sutures of 0 Monocryl x 2 layers. Hemostasis was observed. Posterior uterine serosal wall inspected and appears nl.The parietal peritoneum was closed with a running 2-0 Monocryl suture. The fascia was then reapproximated with running sutures of 0 Monocryl. The skin was reapproximated with 4-0 vicryl after Lehigh closure with 2-0 plain.  Instrument, sponge, and needle counts were correct prior the abdominal closure and at the conclusion of the case.   Findings: Female fetus, post complete previa.  Estimated Blood Loss:   965         Drains: foley                 Specimens: placenta                 Complications:  None; patient tolerated the procedure well.         Disposition: PACU - hemodynamically stable.         Condition: stable  Attending Attestation: I performed the procedure.

## 2020-01-25 NOTE — Anesthesia Preprocedure Evaluation (Signed)
Anesthesia Evaluation  Patient identified by MRN, date of birth, ID band Patient awake    Reviewed: Allergy & Precautions, NPO status , Patient's Chart, lab work & pertinent test results  Airway Mallampati: II  TM Distance: >3 FB Neck ROM: Full    Dental no notable dental hx.    Pulmonary neg pulmonary ROS,    Pulmonary exam normal breath sounds clear to auscultation       Cardiovascular negative cardio ROS Normal cardiovascular exam Rhythm:Regular Rate:Normal     Neuro/Psych PSYCHIATRIC DISORDERS Anxiety negative neurological ROS     GI/Hepatic Neg liver ROS, GERD  Medicated,  Endo/Other  negative endocrine ROS  Renal/GU negative Renal ROS  negative genitourinary   Musculoskeletal negative musculoskeletal ROS (+)   Abdominal   Peds  (+) ATTENTION DEFICIT DISORDER WITHOUT HYPERACTIVITY Hematology negative hematology ROS (+)   Anesthesia Other Findings Primary C/S for placenta previa  Reproductive/Obstetrics                             Anesthesia Physical Anesthesia Plan  ASA: II  Anesthesia Plan: Spinal   Post-op Pain Management:    Induction:   PONV Risk Score and Plan: Treatment may vary due to age or medical condition  Airway Management Planned: Natural Airway  Additional Equipment:   Intra-op Plan:   Post-operative Plan:   Informed Consent: I have reviewed the patients History and Physical, chart, labs and discussed the procedure including the risks, benefits and alternatives for the proposed anesthesia with the patient or authorized representative who has indicated his/her understanding and acceptance.     Dental advisory given  Plan Discussed with: CRNA  Anesthesia Plan Comments:         Anesthesia Quick Evaluation

## 2020-01-26 DIAGNOSIS — Z98891 History of uterine scar from previous surgery: Secondary | ICD-10-CM

## 2020-01-26 DIAGNOSIS — D696 Thrombocytopenia, unspecified: Secondary | ICD-10-CM

## 2020-01-26 DIAGNOSIS — O99119 Other diseases of the blood and blood-forming organs and certain disorders involving the immune mechanism complicating pregnancy, unspecified trimester: Secondary | ICD-10-CM

## 2020-01-26 LAB — BPAM RBC
Blood Product Expiration Date: 202111052359
Blood Product Expiration Date: 202111052359
Unit Type and Rh: 6200
Unit Type and Rh: 6200

## 2020-01-26 LAB — TYPE AND SCREEN
ABO/RH(D): A POS
Antibody Screen: NEGATIVE
Unit division: 0
Unit division: 0

## 2020-01-26 LAB — CBC
HCT: 30.8 % — ABNORMAL LOW (ref 36.0–46.0)
Hemoglobin: 10.5 g/dL — ABNORMAL LOW (ref 12.0–15.0)
MCH: 31 pg (ref 26.0–34.0)
MCHC: 34.1 g/dL (ref 30.0–36.0)
MCV: 90.9 fL (ref 80.0–100.0)
Platelets: 134 10*3/uL — ABNORMAL LOW (ref 150–400)
RBC: 3.39 MIL/uL — ABNORMAL LOW (ref 3.87–5.11)
RDW: 13.1 % (ref 11.5–15.5)
WBC: 11.4 10*3/uL — ABNORMAL HIGH (ref 4.0–10.5)
nRBC: 0 % (ref 0.0–0.2)

## 2020-01-26 MED ORDER — POLYSACCHARIDE IRON COMPLEX 150 MG PO CAPS
150.0000 mg | ORAL_CAPSULE | Freq: Every day | ORAL | Status: DC
  Administered 2020-01-26 – 2020-01-27 (×2): 150 mg via ORAL
  Filled 2020-01-26 (×2): qty 1

## 2020-01-26 MED ORDER — MAGNESIUM OXIDE 400 (241.3 MG) MG PO TABS
400.0000 mg | ORAL_TABLET | Freq: Every day | ORAL | Status: DC
  Administered 2020-01-26 – 2020-01-27 (×2): 400 mg via ORAL
  Filled 2020-01-26 (×2): qty 1

## 2020-01-26 NOTE — Lactation Note (Signed)
This note was copied from a baby's chart. Lactation Consultation Note Baby 16 hrs old. Mom states baby is BF well. Mom has 34 yr old that she BF for 1 1/2 yrs. Mom just finished BF when LC entered rm. Mom has good body alignment and positioning.  Baby appears to be satisfied.  Newborn behavior, feeding habits, STS, I&O, breast massage, supply and demand discussed. Positioning, props, support and safety discussed.  Mom has good everted nipples. Denies painful latches. Mom states she feels things are going well.  Encouraged to call for assistance or questions. Lactation brochure given.  Patient Name: Stacy Mueller [id number] Date: 01/26/2020 Reason for consult: Initial assessment;Early term 37-38.6wks   Maternal Data Has patient been taught Hand Expression?: Yes Does the patient have breastfeeding experience prior to this delivery?: Yes  Feeding Feeding Type: Breast Fed  LATCH Score       Type of Nipple: Everted at rest and after stimulation  Comfort (Breast/Nipple): Soft / non-tender        Interventions Interventions: Breast feeding basics reviewed;Support pillows;Skin to skin;Breast massage;Hand express;Breast compression  Lactation Tools Discussed/Used WIC Program: No   Consult Status Consult Status: Follow-up Date: 01/27/20 Follow-up type: In-patient    Charyl Dancer 01/26/2020, 4:55 AM

## 2020-01-26 NOTE — Progress Notes (Signed)
POSTOPERATIVE DAY # 1 S/P Primary LTCS for complete placenta previa, baby girl "Ashlyn"   S:         Reports feeling well, denies pain.              Tolerating po intake / no nausea / no vomiting / no flatus / no BM  Denies dizziness, SOB, or CP             Bleeding is light             Pain controlled withMotrin and Tylenol             Up ad lib / ambulatory/ voiding QS without difficulty   Newborn bresat feeding - going well   O:  VS: BP 106/67 (BP Location: Left Arm)   Pulse 74   Temp 99.2 F (37.3 C) (Oral)   Resp 17   Ht 5\' 9"  (1.753 m)   Wt 86.6 kg   SpO2 99%   Breastfeeding Unknown   BMI 28.21 kg/m   Patient Vitals for the past 24 hrs:  BP Temp Temp src Pulse Resp SpO2 Height Weight  01/26/20 0453 106/67 99.2 F (37.3 C) Oral 74 17 99 % -- --  01/26/20 0007 122/71 99.3 F (37.4 C) Oral 73 20 98 % -- --  01/25/20 2121 -- -- -- -- -- 99 % -- --  01/25/20 1800 111/79 98.6 F (37 C) Oral 72 18 97 % -- --  01/25/20 1700 120/81 98.6 F (37 C) Oral 65 18 98 % -- --  01/25/20 1605 113/70 97.8 F (36.6 C) Oral 68 18 98 % -- --  01/25/20 1449 105/64 98.2 F (36.8 C) Oral 73 17 99 % -- --  01/25/20 1430 123/88 -- -- 75 20 97 % -- --  01/25/20 1415 106/63 -- -- 83 17 97 % -- --  01/25/20 1400 102/65 -- -- 89 20 98 % -- --  01/25/20 1345 104/70 -- -- 89 20 98 % -- --  01/25/20 1340 103/81 98.9 F (37.2 C) Oral 92 17 98 % -- --  01/25/20 1111 114/73 98.8 F (37.1 C) Oral 88 17 -- -- --  01/25/20 1107 -- -- -- -- -- -- 5\' 9"  (1.753 m) 86.6 kg     LABS:              Recent Labs    01/26/20 0509  WBC 11.4*  HGB 10.5*  PLT 134*               Bloodtype: --/--/A POS Performed at [facility], [address] [place], [address] [place]  [phone])  Rubella: Immune (04/13 0000)                                             I&O: Intake/Output      10/25 0701 - 10/26 0700 10/26 0701 - 10/27 0700   P.O. 118    I.V. (mL/kg) 2272.9 (26.2)    IV Piggyback 200     Total Intake(mL/kg) 2590.9 (29.9)    Urine (mL/kg/hr) 2100    Blood 1073    Total Output 3173    Net -582.2                      Physical Exam:  Alert and Oriented X3  Lungs: Clear and unlabored  Heart: regular rate and rhythm / no murmurs  Abdomen: soft, non-tender, moderate gaseous distention, active bowel sounds in all quadrants              Fundus: firm, non-tender, U-2             Dressing: pressure dressing on, but able to visualize honeycomb dressing underneath and it is c/d/i              Incision:  approximated with sutures / no erythema / no ecchymosis / no drainage  Perineum: intact  Lochia: small rubra on pad   Extremities: no edema, no calf pain or tenderness,   A/P:    POD # 1 S/P Primary LTCS             ABL Anemia    - being Niferex 150 mg PO daily tomorrow when bowel function improves   - Magnesium oxide 400mg  PO daily   Gestational thrombocytopenia    - plts stable   Routine postoperative care              Warm liquids and ambulation to promote bowel motility   Encouraged to attempt to void every 2 hrs  Lactation support PRN  Continue current care  D/C IV and may shower  Anticipate early d/c home tomorrow   , MSN, CNM [facility]

## 2020-01-26 NOTE — Anesthesia Postprocedure Evaluation (Signed)
Anesthesia Post Note  Patient: Stacy Mueller  Procedure(s) Performed: Primary CESAREAN SECTION (N/A )     Patient location during evaluation: PACU Anesthesia Type: Spinal Level of consciousness: oriented and awake and alert Pain management: pain level controlled Vital Signs Assessment: post-procedure vital signs reviewed and stable Respiratory status: spontaneous breathing, respiratory function stable and patient connected to nasal cannula oxygen Cardiovascular status: blood pressure returned to baseline and stable Postop Assessment: no headache, no backache and no apparent nausea or vomiting Anesthetic complications: no   No complications documented.  Last Vitals:  Vitals:   01/26/20 0007 01/26/20 0453  BP: 122/71 106/67  Pulse: 73 74  Resp: 20 17  Temp: 37.4 C 37.3 C  SpO2: 98% 99%    Last Pain:  Vitals:   01/26/20 0900  TempSrc:   PainSc: 0-No pain   Pain Goal:                   Lumi Winslett L Isiaha Greenup

## 2020-01-26 NOTE — Progress Notes (Signed)
CSW received consult for hx of Anxiety. CSW met with MOB to offer support and complete assessment.    CSW congratulated MOB on [place] birth of infant. CSW advised MOB of the HIPPA policy in which MOB reported that she was fine with CSW askin FOB to leave room. FOB left room with no issues. CSW then advised MOB of CSW's role and the reason for CSW coming to speak with her. MOB reported no hx of anxiety or any other mental health hx. MOB reported that she has never dealt with any mental health concern. CSW understanding and asked for permission to leave MOB information on PPD in which MOB was agreeable to. MOB was advised of signs and symptoms that relate to PPD. MOB was given PPD Checklist in order to keep track of feeling as they may relate to PPD. MOB reported that she understood and expressed no SI, HI or DV to CSW.  CSW inquired from [facility] on who her supports are during this time in which MOB reported that she has her family as well as FOB and his family that are very helpful and supportive for her at this time. MOB reported that she has all needed items to care for infant with plans for infant to be seen at [facility] for further care.   CSW provided review of Sudden Infant Death Syndrome (SIDS) precautions.  MOB reported that she has a crib and two basinets for infant to sleep in once arrived home.   CSW identifies no further need for intervention and no barriers to discharge at this time.   Virgie Dad Dorr Perrot, MSW, LCSW [facility] [place] [facility] [place] [phone]

## 2020-01-27 MED ORDER — IBUPROFEN 800 MG PO TABS
800.0000 mg | ORAL_TABLET | Freq: Four times a day (QID) | ORAL | 0 refills | Status: DC
Start: 2020-01-27 — End: 2023-02-26

## 2020-01-27 MED ORDER — PRENATAL MULTIVITAMIN CH: 1.0000 | ORAL_TABLET | Freq: Every day | ORAL | Status: DC

## 2020-01-27 MED ORDER — ACETAMINOPHEN 500 MG PO TABS: 1000.0000 mg | ORAL_TABLET | Freq: Four times a day (QID) | ORAL | 2 refills | Status: AC | PRN

## 2020-01-27 MED ORDER — COCONUT OIL OIL: 1.0000 "application " | TOPICAL_OIL | 0 refills | Status: DC | PRN

## 2020-01-27 MED ORDER — SENNOSIDES-DOCUSATE SODIUM 8.6-50 MG PO TABS: 2.0000 | ORAL_TABLET | ORAL | Status: DC

## 2020-01-27 MED ORDER — OXYCODONE HCL 5 MG PO TABS: 5.0000 mg | ORAL_TABLET | Freq: Three times a day (TID) | ORAL | 0 refills | Status: AC | PRN

## 2020-01-27 MED ORDER — SIMETHICONE 80 MG PO CHEW: 80.0000 mg | CHEWABLE_TABLET | ORAL | 0 refills | Status: DC | PRN

## 2020-01-27 NOTE — Lactation Note (Signed)
This note was copied from a baby's chart. Lactation Consultation Note  Patient Name: Stacy Mueller [id number] Date: 01/27/2020  P2, 44 hour female ETI infant -6% weight loss. Infant had 3 stools and 8 voids since birth. Per mom, BF is going well no pain or trauma with latch and infant is BF 30 minutes most feedings. LC did not observe latch per mom, she BF infant one hour before LC entered the room. Per mom, she does have DEBP at home. Mom has BF experience she BF her 1st child who is now 13 years for one year.  LC discussed engorgement treatment and prevention. LC discussed breastfeeding storage and collection. LC encouraged mom to attend [facility] BF Support group after discharge from Hospital (free). Mom made aware of O/P services, breastfeeding support groups, community resources, and our phone # for post-discharge questions. .  Maternal Data    Feeding Feeding Type: Breast Fed  LATCH Score                   Interventions    Lactation Tools Discussed/Used     Consult Status      Danelle Earthly 01/27/2020, 9:41 AM

## 2020-01-27 NOTE — Anesthesia Procedure Notes (Signed)
Spinal  Patient location during procedure: OR Start time: 01/25/2020 12:20 PM End time: 01/25/2020 12:24 PM Staffing Performed: anesthesiologist  Anesthesiologist: Elmer Picker, MD Preanesthetic Checklist Completed: patient identified, IV checked, risks and benefits discussed, surgical consent, monitors and equipment checked, pre-op evaluation and timeout performed Spinal Block Patient position: sitting Prep: DuraPrep and site prepped and draped Patient monitoring: cardiac monitor, continuous pulse ox and blood pressure Approach: midline Location: L3-4 Injection technique: single-shot Needle Needle type: Pencan  Needle gauge: 24 G Needle length: 9 cm Assessment Sensory level: T6 Additional Notes Functioning IV was confirmed and monitors were applied. Sterile prep and drape, including hand hygiene and sterile gloves were used. The patient was positioned and the spine was prepped. The skin was anesthetized with lidocaine.  Free flow of clear CSF was obtained prior to injecting local anesthetic into the CSF.  The spinal needle aspirated freely following injection.  The needle was carefully withdrawn.  The patient tolerated the procedure well.   Place by [facility] under direct supervision.

## 2020-01-27 NOTE — Addendum Note (Signed)
Addendum  created 01/27/20 1647 by Elmer Picker, MD   Child order released for a procedure order, Clinical Note Signed, Intraprocedure Blocks edited

## 2020-01-27 NOTE — Discharge Summary (Signed)
OB Discharge Summary  Patient Name: Stacy Mueller DOB: Aug 05, 1985 MRN: [id number]  Date of admission: 01/25/2020 Delivering provider: Olivia Mackie   Admitting diagnosis: Placenta previa [O44.00] Intrauterine pregnancy: [redacted]w[redacted]d     Secondary diagnosis: Patient Active Problem List   Diagnosis Date Noted  . Postpartum care following cesarean delivery (10/25) 01/26/2020  . S/P cesarean section: complete placenta previa 01/26/2020  . Gestational thrombocytopenia (HCC) 01/26/2020  . Placenta previa 01/25/2020  . ADD (attention deficit disorder)   . Anxiety   . Vitamin B 12 deficiency   . Vitamin D deficiency   . Graves disease   . GERD (gastroesophageal reflux disease)    Additional problems:none   Date of discharge: 01/27/2020   Discharge diagnosis: Principal Problem:   Postpartum care following cesarean delivery (10/25) Active Problems:   Placenta previa   S/P cesarean section: complete placenta previa   Gestational thrombocytopenia (HCC)                                                              Post partum procedures:none  Augmentation: N/A Pain control: Spinal  Laceration:None  Episiotomy:None  Complications: None  Hospital course:  Sceduled C/S   34 y.o. yo G2P2002 at [redacted]w[redacted]d was admitted to the hospital 01/25/2020 for scheduled cesarean section with the following indication:Previa.Delivery details are as follows:  Membrane Rupture Time/Date: 12:46 PM ,01/25/2020   Delivery Method:C-Section, Low Transverse  Details of operation can be found in separate operative note.  Patient had an uncomplicated postpartum course.  She is ambulating, tolerating a regular diet, passing flatus, and urinating well. Patient is discharged home in stable condition on  01/27/20        Newborn Data: Birth date:01/25/2020  Birth time:12:46 PM  Gender:Female  Living status:Living  Apgars:8 ,9  Weight:3104 g     Physical exam  Vitals:   01/26/20 0453 01/26/20 1410 01/26/20  2134 01/27/20 0530  BP: 106/67 106/63 112/60 109/76  Pulse: 74 71 70 94  Resp: 17 18  17   Temp: 99.2 F (37.3 C) 98.9 F (37.2 C) 98.4 F (36.9 C) 98.7 F (37.1 C)  TempSrc: Oral Oral  Axillary  SpO2: 99%     Weight:      Height:       General: alert, cooperative and no distress Lochia: appropriate Uterine Fundus: firm Incision: Healing well with no significant drainage, Dressing is clean, dry, and intact DVT Evaluation: No cords or calf tenderness. No significant calf/ankle edema. Labs: Lab Results  Component Value Date   WBC 11.4 (H) 01/26/2020   HGB 10.5 (L) 01/26/2020   HCT 30.8 (L) 01/26/2020   MCV 90.9 01/26/2020   PLT 134 (L) 01/26/2020   CMP Latest Ref Rng & Units 10/10/2006  Glucose 70 - 99 mg/dL 87  BUN 6 - 23 mg/dL 7  Creatinine 0.4 - 1.2 mg/dL 0.9  Sodium 12/11/2006 - 008 meq/L 139  Potassium 3.5 - 5.1 meq/L 3.4(L)  Chloride 96 - 112 meq/L 105  CO2 19 - 32 meq/L 26  Calcium 8.4 - 10.5 mg/dL 9.1  AST 0 - 37 units/L 16  ALT 0 - 35 units/L 13   Edinburgh Postnatal Depression Scale Screening Tool 01/26/2020 01/25/2020  I have been able to laugh and see the funny side of things. 0 (  No Data)  I have looked forward with enjoyment to things. 0 -  I have blamed myself unnecessarily when things went wrong. 0 -  I have been anxious or worried for no good reason. 0 -  I have felt scared or panicky for no good reason. 0 -  Things have been getting on top of me. 1 -  I have been so unhappy that I have had difficulty sleeping. 0 -  I have felt sad or miserable. 0 -  I have been so unhappy that I have been crying. 0 -  The thought of harming myself has occurred to me. 0 -  Edinburgh Postnatal Depression Scale Total 1 -   Vaccines: TDaP          UTD         Flu             UTD                    COVID-19 UTD  Discharge instruction:  per After Visit Summary,  [facility] booklet and  "Understanding Mother [facility] Baby Care" hospital booklet  After Visit Meds:  Allergies as  of 01/27/2020   No Known Allergies     Medication List    TAKE these medications   acetaminophen 500 MG tablet Commonly known as: TYLENOL Take 2 tablets (1,000 mg total) by mouth every 6 (six) hours as needed.   coconut oil Oil Apply 1 application topically as needed.   ibuprofen 800 MG tablet Commonly known as: ADVIL Take 1 tablet (800 mg total) by mouth every 6 (six) hours.   oxyCODONE 5 MG immediate release tablet Commonly known as: Roxicodone Take 1 tablet (5 mg total) by mouth every 8 (eight) hours as needed.   prenatal multivitamin Tabs tablet Take 1 tablet by mouth daily at 12 noon.   senna-docusate 8.6-50 MG tablet Commonly known as: Senokot-S Take 2 tablets by mouth daily. Start taking on: January 28, 2020   simethicone 80 MG chewable tablet Commonly known as: MYLICON Chew 1 tablet (80 mg total) by mouth as needed for flatulence.       Diet: routine diet  Activity: Advance as tolerated. Pelvic rest for 6 weeks.   Postpartum contraception: Not Discussed  Newborn Data: Live born female  Birth Weight: 6 lb 13.5 oz (3104 g) APGAR: 8, 9  Newborn Delivery   Birth date/time: 01/25/2020 12:46:00 Delivery type: C-Section, Low Transverse Trial of labor: No C-section categorization: Primary      named Ashlyn Baby Feeding: Breast Disposition:home with mother   Delivery Report:  Review the Delivery Report for details.    Follow up:  Follow-up Information    Olivia Mackie, MD. Schedule an appointment as soon as possible for a visit in 6 week(s).   Specialty: Obstetrics and Gynecology Contact information: [address] [place] [address] [phone]                 Signed: Cipriano Mile, MSN 01/27/2020, 12:41 PM

## 2020-02-09 ENCOUNTER — Encounter: Payer: Managed Care, Other (non HMO) | Admitting: Adult Health

## 2020-02-29 NOTE — Progress Notes (Deleted)
Complete Physical  Assessment and Plan:  Sara Brewer was seen today for annual exam.  Diagnoses and all orders for this visit:  Encounter for routine adult health examination with abnormal findings -     CBC with Diff -     COMPLETE METABOLIC PANEL WITH GFR -     Magnesium -     Urinalysis, Routine w reflex microscopic  Gastroesophageal reflux disease with esophagitis, unspecified whether hemorrhage Well managed on current medications Discussed diet, avoiding triggers and other lifestyle changes  Graves disease Est with Dr. Forde Brewer, monitor TSH, refer back if trending down -     TSH  Vitamin B12 deficiency Continue supplement  -     Vitamin B12  Vitamin D deficiency .Continue supplement  -     Vitamin D (25 hydroxy)  Anxiety state Improved with effexor; continue Stress management techniques discussed, increase water, good sleep hygiene discussed, increase exercise, and increase veggies.  -     venlafaxine XR (EFFEXOR XR) 37.5 MG 24 hr capsule; Take 1-2 tabs by mouth daily for anxiety and mood. *** -     ALPRAZolam (XANAX) 0.5 MG tablet; Take 1/2-1 tab once or twice a day if needed only for panic attacks. Limit to <5 days per week to avoid tolerance.  OCP (oral contraceptive pills) initiation -     etonogestrel-ethinyl estradiol (NUVARING) 0.12-0.015 MG/24HR vaginal ring; Insert vaginally and leave in place for 3 consecutive weeks, then remove for 1 week.  Attention deficit disorder, unspecified hyperactivity presence Med helps with focus, no AE's. The patient was counseled on the addictive nature of the medication and was encouraged to take drug holidays when not needed. Monitor via PDMP.  -     amphetamine-dextroamphetamine (ADDERALL) 30 MG tablet; Take 1 tablet by mouth daily.  Screening for diabetes mellitus/ family history of diabetes mellitus -     Insulin, random -     HbA1C  Overweight (BMI 25.0-29.9) Long discussion about weight loss, diet, and exercise Recommended  diet heavy in fruits and veggies and low in animal meats, cheeses, and dairy products, appropriate calorie intake Discussed appropriate weight for height Follow up at next visit -     Insulin, random  Irregular bowel habits *** If not on benefiber then add it, decrease stress,  if any worsening symptoms, blood in stool, AB pain, etc call office Fodmap information provided and suggested elimination diet  Other orders -     ondansetron (ZOFRAN) 4 MG tablet; Take 1 tablet (4 mg total) by mouth daily as needed for nausea or vomiting.   Discussed med's effects and SE's. Screening labs and tests as requested with regular follow-up as recommended. Over 40 minutes of exam, counseling, chart review, and complex, high level critical decision making was performed this visit.   Future Appointments  Date Time Provider [place]  03/01/2020  9:00 AM Sara Comber, NP [facility] None     HPI  34 y.o. female  presents for a complete physical and follow up for has Graves disease; GERD (gastroesophageal reflux disease); ADHD (attention deficit hyperactivity disorder); Anxiety; Vitamin B12 deficiency; Vitamin D deficiency; and Insulin resistance on their problem list.   She works as a Catering manager.  She does have steady relationship with boyfriend  She has hx of abnormal pap smear with LEEP procedure. Not in hx of surgeries   Has been on Nuvaring. Hx of irregular and very heavy menses. She had normal PAP in our office 07/2016 and recommended 3 year follow  up. GYN wasn't fully covered by insurance.   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 Has been recommended surgical intervention but postponing. She has history of Graves, with other family history of autoimmune. She had autoimmune workup last year including ANA, sed rate, RF, anti DNA, HLA B27 antibody, RNP antibody, SSB ([place]) antibody, and SSA ([place])  antibody were unremarkable.  Vitamin D and B12 levels were found to be low and she was advised to initiate supplementation.   Recently this year she presented reporting anxiety, agitation, yelling and lashing out atypical for her with minimal trigger. Works as a International aid/development worker, stress at work r/t covid 81, father ill. She reported good support from boyfriend and friends. Hx of poor benefit with SSRIs (lexapro, zoloft, minimal benefit), tried buspar which seemed to cause more anger, screaming at people, acting like a different person and self tapered off. She was initiated on effexor and has done well with *** She is also prescribed PRN xanax taking 2-3 tabs/week. Has seen numerous counselors in the past without benefit.   She is on adderall PRN for hx of ADD, takes on days that she works only, feels significant improvement with focus. She has not noted significant difference in anxiety on days that she uses vs not. We have discussed at length that adderall may exacerbate anxiety symptoms.   Sleep is unchanged from baseline; manages well with melatonin.   BMI is There is no height or weight on file to calculate BMI., she has been working on diet and exercise. Wt Readings from Last 3 Encounters:  11/04/19 145 lb (65.8 kg)  02/25/19 160 lb 12.8 oz (72.9 kg)  12/29/18 158 lb (71.7 kg)   Today their BP is   She does workout. She denies chest pain, shortness of breath, dizziness.   The cholesterol last visit was:   Lab Results  Component Value Date   CHOL 163 01/28/2018   HDL 67 01/28/2018   LDLCALC 75 01/28/2018   TRIG 129 01/28/2018   CHOLHDL 2.4 01/28/2018   Last A1C in the office was:  Lab Results  Component Value Date   HGBA1C 5.1 02/25/2019   She has documented history of Graves disease, formerly followed by Dr. Forde Brewer, did monitoring only, never was treated and advised monitoring only unless TSHs are persistently low and trending Lab Results  Component Value Date   TSH 0.70 11/04/2019    Last GFR: Lab Results  Component Value Date   GFRNONAA 100 11/04/2019   Patient is on Vitamin D supplement, 4000 IU daily    Lab Results  Component Value Date   VD25OH 10 (L) 11/04/2019     She is on a sublingual B12 supplement *** Lab Results  Component Value Date   VITAMINB12 456 11/04/2019     Current Medications:  Current Outpatient Medications on File Prior to Visit  Medication Sig Dispense Refill  . ALPRAZolam (XANAX) 0.5 MG tablet Take 1/2-1 tab once or twice a day if needed only for panic attacks. Limit to <5 days per week to avoid tolerance. 60 tablet 0  . amphetamine-dextroamphetamine (ADDERALL) 30 MG tablet Take     1 tablet       Daily       as needed for Focus & Concentration; try to limit to <5 /week due to addictive potential. Should last longer than 30 days. 30 tablet 0  . cyanocobalamin (,VITAMIN B-12,) 1000  MCG/ML injection INJECT 1 ML (1000 MCG TOTAL) INTO THE SKIN EVERY 30 DAYS 10 mL 0  . etonogestrel-ethinyl estradiol (NUVARING) 0.12-0.015 MG/24HR vaginal ring Insert vaginally and leave in place for 3 consecutive weeks, then remove for 1 week. 3 each 1  . meloxicam (MOBIC) 15 MG tablet Take 1 tablet (15 mg total) by mouth daily. 90 tablet 0  . Multiple Vitamin (MULTIVITAMIN) tablet Take 1 tablet by mouth daily.    . Probiotic Product (PROBIOTIC DAILY PO) Take 1 tablet by mouth daily.    Marland Kitchen venlafaxine XR (EFFEXOR XR) 150 MG 24 hr capsule Take 1-2 caps every Morning for Mood and Anxiety 90 capsule 1  . Vitamin D, Ergocalciferol, (DRISDOL) 1.25 MG (50000 UNIT) CAPS capsule Take twice weekly for 1 month, then take once weekly for severe vitamin D deficiency. 16 capsule 1   No current facility-administered medications on file prior to visit.   Allergies:  Allergies  Allergen Reactions  . Codeine Nausea And Vomiting  . Milk-Related Compounds Hives and Nausea And Vomiting  . Sulfonamide Derivatives Nausea And Vomiting  . Penicillins Nausea And Vomiting    PER  PATIENT just nausea and vomiting   Medical History:  She has Graves disease; GERD (gastroesophageal reflux disease); ADHD (attention deficit hyperactivity disorder); Anxiety; Vitamin B12 deficiency; Vitamin D deficiency; and Insulin resistance on their problem list. Health Maintenance:   Immunization History  Administered Date(s) Administered  . HPV Quadrivalent 09/09/2001, 06/14/2005, 10/16/2005  . Hepatitis B 04/03/1999  . Meningococcal Polysaccharide 04/03/1999  . Td 10/26/2004  . Tdap 09/13/2014    Tetanus: 2016 Flu vaccine: declines HPV: 3/3 Covid 19: ***  LMP: No LMP recorded. Pap: 07/2016 neg HPV, due 2021 here *** MGM: n/a  Colonoscopy: n/a EGD:  Last Dental Exam: Dr. Randel Pigg in [place], last 2020, q19mLast Eye Exam: [facility], last 2020  Last Derm: [facility] [place] [place] [facility] [place] annually due to fam hx of melanoma   Patient Care Team: MUnk Pinto MD as PCP - General (Internal Medicine) GGatha Mayer MD as Consulting Physician (Gastroenterology) MMegan Salon MD as Consulting Physician (Gynecology) DDespina Hick MD (Dermatology)  Surgical History:  She has a past surgical history that includes Wisdom tooth extraction; gum grafts; and laparoscopy (02/19/2011). Family History:  Herfamily history includes Breast cancer (age of onset: 558 in her maternal grandmother; Cancer in her father, maternal uncle, paternal grandfather, and paternal grandmother; Heart disease in her brother, maternal grandfather, maternal grandmother, paternal grandfather, and paternal grandmother; Heart murmur in her brother; Hyperlipidemia in her paternal grandfather; Hypertension in her paternal grandfather; Pancreatic cancer in her maternal grandmother; Stroke (age of onset: 534 in her mother. Social History:  She reports that she has never smoked. She has never used smokeless tobacco. She reports that she does not drink alcohol and does not use drugs.  Review of Systems: *** Review of Systems   Constitutional: Negative for malaise/fatigue and weight loss.  HENT: Negative for hearing loss and tinnitus.   Eyes: Negative for blurred vision and double vision.  Respiratory: Negative for cough, sputum production, shortness of breath and wheezing.   Cardiovascular: Negative for chest pain, palpitations, orthopnea, claudication, leg swelling and PND.  Gastrointestinal: Positive for constipation (very irregular). Negative for abdominal pain, blood in stool, diarrhea, heartburn, melena, nausea and vomiting.  Genitourinary: Negative.   Musculoskeletal: Negative for falls, joint pain and myalgias.  Skin: Negative for rash.  Neurological: Positive for headaches (sinus HA in airplane). Negative for dizziness, tingling, sensory  change and weakness.  Endo/Heme/Allergies: Positive for environmental allergies. Negative for polydipsia.  Psychiatric/Behavioral: Negative for depression, memory loss, substance abuse and suicidal ideas. The patient is nervous/anxious. The patient does not have insomnia.   All other systems reviewed and are negative.   Physical Exam: Estimated body mass index is 23.4 kg/m as calculated from the following:   Height as of 02/25/19: _0  (1.676 m).   Weight as of 11/04/19: 145 lb (65.8 kg). There were no vitals taken for this visit. General Appearance: Well nourished, in no apparent distress.  Eyes: PERRLA, EOMs, conjunctiva no swelling or erythema, defer fundal exam to vision provider Sinuses: No Frontal/maxillary tenderness  ENT/Mouth: Ext aud canals clear, normal light reflex with TMs without erythema, bulging. Good dentition. No erythema, swelling, or exudate on post pharynx. Tonsils not swollen or erythematous. Hearing normal.  Neck: Supple, thyroid normal. No bruits  Respiratory: Respiratory effort normal, BS equal bilaterally without rales, rhonchi, wheezing or stridor.  Cardio: RRR without murmurs, rubs or gallops. Brisk peripheral pulses without edema.  Chest:  symmetric, with normal excursions and percussion.  Breasts: Symmetric, without lumps, nipple discharge, retractions. Abdomen: Soft, nontender, no guarding, rebound, hernias, masses, or organomegaly.  Lymphatics: Non tender without lymphadenopathy.  Genitourinary: defer Musculoskeletal: Full ROM all peripheral extremities,5/5 strength, and normal gait.  Skin: Warm, dry without rashes, lesions, ecchymosis. Neuro: Cranial nerves intact, reflexes equal bilaterally. Normal muscle tone, no cerebellar symptoms. Sensation intact.  Psych: Awake and oriented X 3, normal affect, Insight and Judgment appropriate.   EKG: WNL no ST changes reviewed from 2019, defer this year  ISABELLY KOBLER 8:12 AM [facility]

## 2020-03-01 ENCOUNTER — Encounter: Payer: Managed Care, Other (non HMO) | Admitting: Adult Health

## 2020-03-01 ENCOUNTER — Other Ambulatory Visit: Payer: Self-pay

## 2020-03-01 DIAGNOSIS — F988 Other specified behavioral and emotional disorders with onset usually occurring in childhood and adolescence: Secondary | ICD-10-CM

## 2020-03-01 DIAGNOSIS — F909 Attention-deficit hyperactivity disorder, unspecified type: Secondary | ICD-10-CM

## 2020-03-01 MED ORDER — AMPHETAMINE-DEXTROAMPHETAMINE 30 MG PO TABS: ORAL_TABLET | ORAL | 0 refills | Status: DC

## 2020-03-28 ENCOUNTER — Other Ambulatory Visit: Payer: Self-pay | Admitting: Adult Health

## 2020-04-07 ENCOUNTER — Other Ambulatory Visit: Payer: Self-pay

## 2020-04-07 DIAGNOSIS — F909 Attention-deficit hyperactivity disorder, unspecified type: Secondary | ICD-10-CM

## 2020-04-07 DIAGNOSIS — F988 Other specified behavioral and emotional disorders with onset usually occurring in childhood and adolescence: Secondary | ICD-10-CM

## 2020-04-08 ENCOUNTER — Other Ambulatory Visit: Payer: Self-pay | Admitting: Adult Health

## 2020-04-08 MED ORDER — ALPRAZOLAM 0.5 MG PO TABS: ORAL_TABLET | ORAL | 0 refills | Status: DC

## 2020-04-08 MED ORDER — AMPHETAMINE-DEXTROAMPHETAMINE 30 MG PO TABS: ORAL_TABLET | ORAL | 0 refills | Status: DC

## 2020-04-14 ENCOUNTER — Encounter: Payer: Managed Care, Other (non HMO) | Admitting: Adult Health

## 2020-05-26 ENCOUNTER — Ambulatory Visit: Payer: 59 | Admitting: Podiatrist

## 2020-06-02 ENCOUNTER — Ambulatory Visit (INDEPENDENT_AMBULATORY_CARE_PROVIDER_SITE_OTHER): Payer: 59 | Admitting: Podiatry

## 2020-06-02 ENCOUNTER — Other Ambulatory Visit: Payer: Self-pay

## 2020-06-02 DIAGNOSIS — L600 Ingrowing nail: Secondary | ICD-10-CM

## 2020-06-02 NOTE — Patient Instructions (Signed)

## 2020-06-03 NOTE — Progress Notes (Signed)
Subjective:   Patient ID: Stacy Mueller, female   DOB: 35 y.o.   MRN: [id number]   HPI Patient presents with chronic ingrown toenail right big toe stating that she had it worked on last year and it seemed to improve for a while but has become more of a problem for her again patient states it is quite sore and irritated in the corner   ROS      Objective:  Physical Exam  Neurovascular status intact with patient found to have incurvated right hallux medial border painful when pressed making shoe gear difficult with no redness no drainage noted     Assessment:  Ingrown toenail deformity right hallux which may be regrowth or may be a new type of deformity     Plan:  H&P reviewed condition recommended correction.  I did explain procedure to patient patient is willing to accept risk of surgery and today I went ahead and I infiltrated the right hallux 60 mg like Marcaine mixture sterile prep done and using sterile instrumentation I remove the medial border exposed matrix applied phenol 3 applications 30 seconds followed by alcohol lavage sterile dressing.  Patient will soak was encouraged to leave dressing on 24 hours but take it off earlier if needed and is encouraged to call questions concerns

## 2020-06-06 NOTE — Progress Notes (Signed)
Complete Physical  Assessment and Plan:  Sara Brewer was seen today for annual exam.  Diagnoses and all orders for this visit:  Encounter for routine adult health examination with abnormal findings -     CBC with Diff -     COMPLETE METABOLIC PANEL WITH GFR -     Magnesium -     Urinalysis, Routine w reflex microscopic  Graves disease Est with Dr. Forde Dandy, monitor TSH, refer back if trending down -     TSH  Vitamin B12 deficiency Continue supplement  -     Vitamin B12  Vitamin D deficiency .Continue supplement  -     Vitamin D (25 hydroxy)  Anxiety state Improved with effexor; continue Stress management techniques discussed, increase water, good sleep hygiene discussed, increase exercise, and increase veggies.  -     venlafaxine XR (EFFEXOR XR) 150 MG 24 hr capsule; Take 1 tab by mouth daily for anxiety and mood. -     ALPRAZolam (XANAX) 0.5 MG tablet; Take 1/2-1 tab once or twice a day if needed only for panic attacks. Limit to <5 days per week to avoid tolerance.  OCP (oral contraceptive pills) initiation -     etonogestrel-ethinyl estradiol (NUVARING) 0.12-0.015 MG/24HR vaginal ring; Insert vaginally and leave in place for 3 consecutive weeks, then remove for 1 week.  Attention deficit disorder, unspecified hyperactivity presence Med helps with focus, no AE's. The patient was counseled on the addictive nature of the medication and was encouraged to take drug holidays when not needed. Monitor via PDMP.  -     amphetamine-dextroamphetamine (ADDERALL) 30 MG tablet; Take 1 tablet by mouth daily.  Screening for diabetes mellitus/ family history of diabetes mellitus -     HbA1C  Overweight (BMI 25.0-29.9) Long discussion about weight loss, diet, and exercise Recommended diet heavy in fruits and veggies and low in animal meats, cheeses, and dairy products, appropriate calorie intake Discussed appropriate weight for height Follow up at next visit -     Insulin, random  Irregular  bowel habits Typically after flying; increase fiber, increase water intake;     Discussed med's effects and SE's. Screening labs and tests as requested with regular follow-up as recommended. Over 40 minutes of exam, counseling, chart review, and complex, high level critical decision making was performed this visit.   Future Appointments  Date Time Provider [place]  06/07/2021 10:00 AM [name], NP [facility] None     HPI  35 y.o. female  presents for a complete physical and follow up for has Graves disease; GERD (gastroesophageal reflux disease); ADHD (attention deficit hyperactivity disorder); Anxiety; Vitamin B12 deficiency; Vitamin D deficiency; and Insulin resistance on their problem list.   She works as a Catering manager.  She does have steady relationship with boyfriend.   She has hx of abnormal pap smear with LEEP procedure. Has been on Nuvaring. Hx of irregular and very heavy menses. She had normal PAP in our office 07/2016 and recommended 3 year follow up. GYN wasn't fully covered by insurance.   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 Has been recommended surgical intervention but postponing.   She has history of Graves, with other family history of autoimmune. She had autoimmune workup last year including ANA, sed rate, RF, anti DNA, HLA B27 antibody, RNP antibody, SSB ([place]) antibody, and SSA ([place]) antibody were unremarkable.  Vitamin D and B12 levels were  found to be low and she was advised to initiate supplementation.   Recent years with pandemic was having more depression/anxiety; She reported good support from boyfriend and friends. Hx of poor benefit with SSRIs (lexapro, zoloft, minimal benefit, poorly tolerated buspar), now on effexor and doing well taking 150 mg daily. She is also prescribed PRN xanax taking 0-1 tabs/week. Has seen numerous counselors in the past without  benefit.   She is on adderall PRN for hx of ADD, takes on days that she works only, feels significant improvement with focus. She has not noted significant difference in anxiety on days that she uses vs not. We have discussed at length that adderall may exacerbate anxiety symptoms.   Sleep is unchanged from baseline; shift worker, manages fairly with melatonin.   BMI is Body mass index is 26.76 kg/m., she admits diet hasn't been as good, trying to make better choices when able. Very clean eating when at home.  Wt Readings from Last 3 Encounters:  06/07/20 160 lb 12.8 oz (72.9 kg)  11/04/19 145 lb (65.8 kg)  02/25/19 160 lb 12.8 oz (72.9 kg)   Today their BP is BP: 116/76 She does workout. She denies chest pain, shortness of breath, dizziness.   The cholesterol last visit was:   Lab Results  Component Value Date   CHOL 163 01/28/2018   HDL 67 01/28/2018   LDLCALC 75 01/28/2018   TRIG 129 01/28/2018   CHOLHDL 2.4 01/28/2018   Last A1C in the office was:  Lab Results  Component Value Date   HGBA1C 5.1 02/25/2019   She has documented history of Graves disease, formerly followed by Dr. Forde Dandy, did monitoring only, never was treated and advised monitoring only unless TSHs are persistently low and trending Lab Results  Component Value Date   TSH 0.70 11/04/2019   Last GFR: Lab Results  Component Value Date   GFRNONAA 100 11/04/2019   Patient is on Vitamin D supplement, has been taking 50000 IU weekly Lab Results  Component Value Date   VD25OH 10 (L) 11/04/2019     She is on monthly B12 shots; last was 05/16/2020 Lab Results  Component Value Date   [id number] 633 11/04/2019    Current Medications:  Current Outpatient Medications on File Prior to Visit  Medication Sig Dispense Refill  . ALPRAZolam (XANAX) 0.5 MG tablet Take 1/2-1 tab once or twice a day if needed only for panic attacks. Limit to <5 days per week to avoid tolerance. 60 tablet 0  .  amphetamine-dextroamphetamine (ADDERALL) 30 MG tablet Take     1 tablet       Daily       as needed for Focus & Concentration; try to limit to <5 /week due to addictive potential. Should last longer than 30 days. 30 tablet 0  . cyanocobalamin (,VITAMIN B-12,) 1000 MCG/ML injection INJECT 1 ML (1000 MCG TOTAL) INTO THE SKIN EVERY 30 DAYS 10 mL 0  . etonogestrel-ethinyl estradiol (NUVARING) 0.12-0.015 MG/24HR vaginal ring Insert vaginally and leave in place for 3 consecutive weeks, then remove for 1 week. 3 each 1  . Multiple Vitamin (MULTIVITAMIN) tablet Take 1 tablet by mouth daily.    . Probiotic Product (PROBIOTIC DAILY PO) Take 1 tablet by mouth daily.    . meloxicam (MOBIC) 15 MG tablet Take 1 tablet (15 mg total) by mouth daily. (Patient not taking: Reported on 06/07/2020) 90 tablet 0   No current facility-administered medications on file prior to visit.  Allergies:  Allergies  Allergen Reactions  . Codeine Nausea And Vomiting  . Milk-Related Compounds Hives and Nausea And Vomiting  . Sulfonamide Derivatives Nausea And Vomiting  . Penicillins Nausea And Vomiting    PER PATIENT just nausea and vomiting   Medical History:  She has Graves disease; GERD (gastroesophageal reflux disease); ADHD (attention deficit hyperactivity disorder); Anxiety; Vitamin B12 deficiency; Vitamin D deficiency; and Insulin resistance on their problem list. Health Maintenance:   Immunization History  Administered Date(s) Administered  . HPV Quadrivalent 09/09/2001, 06/14/2005, 10/16/2005  . Hepatitis B 04/03/1999  . Meningococcal Polysaccharide 04/03/1999  . Td 10/26/2004  . Tdap 09/13/2014    Tetanus: 2016 Flu vaccine: declines HPV: 3/3 Covid 19: has had recurrent covid 19, had antibodies checked at work 2 weeks ago, was "very high"  LMP: No LMP recorded. Pap: 07/2016 neg HPV, DUE - declines today, on menstrual cycle, planning to schedule GYN soon MGM: n/a  Colonoscopy: n/a EGD:  Last Dental Exam:  Dr. Randel Pigg in [place], last 2021, q39m Last Eye Exam: [facility], last 2021, contacts Last Derm: Zoe Draelos in [place], goes annually due to fam hx of melanoma, last fall 2021.    Patient Care Team: Unk Pinto, MD as PCP - General (Internal Medicine) Gatha Mayer, MD as Consulting Physician (Gastroenterology) Megan Salon, MD as Consulting Physician (Gynecology) Despina Hick, MD (Dermatology)  Surgical History:  She has a past surgical history that includes Wisdom tooth extraction; gum grafts; and laparoscopy (02/19/2011). Family History:  Herfamily history includes Bladder Cancer (age of onset: 43) in her paternal grandfather; Breast cancer (age of onset: 91) in her maternal grandmother; Cancer in her paternal grandmother; Heart disease in her brother, maternal grandfather, maternal grandmother, paternal grandfather, and paternal grandmother; Heart murmur in her brother; Hyperlipidemia in her paternal grandfather; Hypertension in her paternal grandfather; Melanoma in her father, maternal uncle, and paternal grandfather; Pancreatic cancer in her maternal grandmother; Stroke (age of onset: 39) in her mother. Social History:  She reports that she has never smoked. She has never used smokeless tobacco. She reports that she does not drink alcohol and does not use drugs.   Review of Systems:  Review of Systems  Constitutional: Negative for malaise/fatigue and weight loss.  HENT: Negative for hearing loss and tinnitus.   Eyes: Negative for blurred vision and double vision.  Respiratory: Negative for cough, sputum production, shortness of breath and wheezing.   Cardiovascular: Negative for chest pain, palpitations, orthopnea, claudication, leg swelling and PND.  Gastrointestinal: Positive for constipation (very irregular). Negative for abdominal pain, blood in stool, diarrhea, heartburn, melena, nausea and vomiting.  Genitourinary: Negative.   Musculoskeletal: Negative for falls, joint pain  and myalgias.  Skin: Negative for rash.  Neurological: Positive for headaches (sinus HA in airplane). Negative for dizziness, tingling, sensory change and weakness.  Endo/Heme/Allergies: Positive for environmental allergies. Negative for polydipsia.  Psychiatric/Behavioral: Negative for depression, memory loss, substance abuse and suicidal ideas. The patient is not nervous/anxious (improved by meds) and does not have insomnia.   All other systems reviewed and are negative.   Physical Exam: Estimated body mass index is 26.76 kg/m as calculated from the following:   Height as of this encounter: $RemoveBeforeD'5\' 5"'eGKwPHqHMmMaIQ$  (1.651 m).   Weight as of this encounter: 160 lb 12.8 oz (72.9 kg). BP 116/76   Pulse (!) 104   Temp (!) 97 F (36.1 C)   Ht $R'5\' 5"'Tg$  (1.651 m)   Wt 160 lb 12.8 oz (72.9 kg)  SpO2 96%   BMI 26.76 kg/m  General Appearance: Well nourished, in no apparent distress.  Eyes: PERRLA, EOMs, conjunctiva no swelling or erythema Sinuses: No Frontal/maxillary tenderness  ENT/Mouth: Ext aud canals clear, normal light reflex with TMs without erythema, bulging. Good dentition. No erythema, swelling, or exudate on post pharynx. Tonsils not swollen or erythematous. Hearing normal.  Neck: Supple, thyroid normal. No bruits  Respiratory: Respiratory effort normal, BS equal bilaterally without rales, rhonchi, wheezing or stridor.  Cardio: RRR without murmurs, rubs or gallops. Brisk peripheral pulses without edema.  Chest: symmetric, with normal excursions and percussion.  Breasts: declines, will see GYN, doing self checks, no concerns Abdomen: Soft, nontender, no guarding, rebound, hernias, masses, or organomegaly.  Lymphatics: Non tender without lymphadenopathy.  Genitourinary: defer to GYN Musculoskeletal: Full ROM all peripheral extremities,5/5 strength, and normal gait.  Skin: Warm, dry without rashes, lesions, ecchymosis. Neuro: Cranial nerves intact, reflexes equal bilaterally. Normal muscle tone, no  cerebellar symptoms. Sensation intact.  Psych: Awake and oriented X 3, normal affect, Insight and Judgment appropriate.   EKG: WNL no ST changes reviewed from 2019, defer this year   Sara Brewer 10:17 AM [facility]

## 2020-06-07 ENCOUNTER — Ambulatory Visit: Payer: Managed Care, Other (non HMO) | Admitting: Adult Health

## 2020-06-07 ENCOUNTER — Encounter: Payer: Self-pay | Admitting: Adult Health

## 2020-06-07 ENCOUNTER — Other Ambulatory Visit: Payer: Self-pay

## 2020-06-07 VITALS — BP 116/76 | HR 104 | Temp 97.0°F | Ht 65.0 in | Wt 160.8 lb

## 2020-06-07 DIAGNOSIS — Z0000 Encounter for general adult medical examination without abnormal findings: Secondary | ICD-10-CM | POA: Diagnosis not present

## 2020-06-07 DIAGNOSIS — Z1389 Encounter for screening for other disorder: Secondary | ICD-10-CM | POA: Diagnosis not present

## 2020-06-07 DIAGNOSIS — K2100 Gastro-esophageal reflux disease with esophagitis, without bleeding: Secondary | ICD-10-CM

## 2020-06-07 DIAGNOSIS — F909 Attention-deficit hyperactivity disorder, unspecified type: Secondary | ICD-10-CM

## 2020-06-07 DIAGNOSIS — Z13220 Encounter for screening for lipoid disorders: Secondary | ICD-10-CM | POA: Diagnosis not present

## 2020-06-07 DIAGNOSIS — Z79899 Other long term (current) drug therapy: Secondary | ICD-10-CM

## 2020-06-07 DIAGNOSIS — E8881 Metabolic syndrome: Secondary | ICD-10-CM

## 2020-06-07 DIAGNOSIS — F411 Generalized anxiety disorder: Secondary | ICD-10-CM

## 2020-06-07 DIAGNOSIS — Z131 Encounter for screening for diabetes mellitus: Secondary | ICD-10-CM | POA: Diagnosis not present

## 2020-06-07 DIAGNOSIS — F419 Anxiety disorder, unspecified: Secondary | ICD-10-CM

## 2020-06-07 DIAGNOSIS — E559 Vitamin D deficiency, unspecified: Secondary | ICD-10-CM

## 2020-06-07 DIAGNOSIS — Z6825 Body mass index (BMI) 25.0-25.9, adult: Secondary | ICD-10-CM

## 2020-06-07 DIAGNOSIS — E0500 Thyrotoxicosis with diffuse goiter without thyrotoxic crisis or storm: Secondary | ICD-10-CM

## 2020-06-07 DIAGNOSIS — E538 Deficiency of other specified B group vitamins: Secondary | ICD-10-CM

## 2020-06-07 MED ORDER — VITAMIN D (ERGOCALCIFEROL) 1.25 MG (50000 UNIT) PO CAPS: ORAL_CAPSULE | ORAL | 1 refills | Status: DC

## 2020-06-07 MED ORDER — VENLAFAXINE HCL ER 150 MG PO CP24: ORAL_CAPSULE | ORAL | 3 refills | Status: DC

## 2020-06-07 NOTE — Patient Instructions (Addendum)
Sara Brewer , Thank you for taking time to come for your Annual Wellness Visit. I appreciate your ongoing commitment to your health goals. Please review the following plan we discussed and let me know if I can assist you in the future.   These are the goals we discussed: Goals    . Weight (lb) < 150 lb (68 kg)       This is a list of the screening recommended for you and due dates:  Health Maintenance  Topic Date Due  . Pap Smear  07/12/2019  . COVID-19 Vaccine (1) 06/23/2020*  . Flu Shot  06/30/2020*  . Tetanus Vaccine  09/12/2024  . HPV Vaccine  Completed  . HIV Screening  Completed  .  Hepatitis C: One time screening is recommended by Center for Disease Control  (CDC) for  adults born from [birth year] through 1965.   Discontinued  *Topic was postponed. The date shown is not the original due date.    Please ask GYN what labs they would like checked and can schedule a lab only visit -     Know what a healthy weight is for you (roughly BMI <25) and aim to maintain this  Aim for 7+ servings of fruits and vegetables daily  65-80+ fluid ounces of water or unsweet tea for healthy kidneys  Limit to max 1 drink of alcohol per day; avoid smoking/tobacco  Limit animal fats in diet for cholesterol and heart health - choose grass fed whenever available  Avoid highly processed foods, and foods high in saturated/trans fats  Aim for low stress - take time to unwind and care for your mental health  Aim for 150 min of moderate intensity exercise weekly for heart health, and weights twice weekly for bone health  Aim for 7-9 hours of sleep daily      High-Fiber Eating Plan Fiber, also called dietary fiber, is a type of carbohydrate. It is found foods such as fruits, vegetables, whole grains, and beans. A high-fiber diet can have many health benefits. Your health care provider may recommend a high-fiber diet to help:  Prevent constipation. Fiber can make your bowel movements more  regular.  Lower your cholesterol.  Relieve the following conditions: ? Inflammation of veins in the anus (hemorrhoids). ? Inflammation of specific areas of the digestive tract (uncomplicated diverticulosis). ? A problem of the large intestine, also called the colon, that sometimes causes pain and diarrhea (irritable bowel syndrome, or IBS).  Prevent overeating as part of a weight-loss plan.  Prevent heart disease, type 2 diabetes, and certain cancers. What are tips for following this plan? Reading food labels  Check the nutrition facts label on food products for the amount of dietary fiber. Choose foods that have 5 grams of fiber or more per serving.  The goals for recommended daily fiber intake include: ? Men (age 21 or younger): 34-38 g. ? Men (over age 21): 28-34 g. ? Women (age 21 or younger): 25-28 g. ? Women (over age 18): 22-25 g. Your daily fiber goal is _____________ g.   Shopping  Choose whole fruits and vegetables instead of processed forms, such as apple juice or applesauce.  Choose a wide variety of high-fiber foods such as avocados, lentils, oats, and kidney beans.  Read the nutrition facts label of the foods you choose. Be aware of foods with added fiber. These foods often have high sugar and sodium amounts per serving. Cooking  Use whole-grain flour for baking and cooking.  Sara Brewer  with brown rice instead of white rice. Meal planning  Start the day with a breakfast that is high in fiber, such as a cereal that contains 5 g of fiber or more per serving.  Eat breads and cereals that are made with whole-grain flour instead of refined flour or white flour.  Eat brown rice, bulgur wheat, or millet instead of white rice.  Use beans in place of meat in soups, salads, and pasta dishes.  Be sure that half of the grains you eat each day are whole grains. General information  You can get the recommended daily intake of dietary fiber by: ? Eating a variety of fruits,  vegetables, grains, nuts, and beans. ? Taking a fiber supplement if you are not able to take in enough fiber in your diet. It is better to get fiber through food than from a supplement.  Gradually increase how much fiber you consume. If you increase your intake of dietary fiber too quickly, you may have bloating, cramping, or gas.  Drink plenty of water to help you digest fiber.  Choose high-fiber snacks, such as berries, raw vegetables, nuts, and popcorn. What foods should I eat? Fruits Berries. Pears. Apples. Oranges. Avocado. Prunes and raisins. Dried figs. Vegetables Sweet potatoes. Spinach. Kale. Artichokes. Cabbage. Broccoli. Cauliflower. Green peas. Carrots. Squash. Grains Whole-grain breads. Multigrain cereal. Oats and oatmeal. Brown rice. Barley. Bulgur wheat. Millet. Quinoa. Bran muffins. Popcorn. Rye wafer crackers. Meats and other proteins Navy beans, kidney beans, and pinto beans. Soybeans. Split peas. Lentils. Nuts and seeds. Dairy Fiber-fortified yogurt. Beverages Fiber-fortified soy milk. Fiber-fortified orange juice. Other foods Fiber bars. The items listed above may not be a complete list of recommended foods and beverages. Contact a dietitian for more information. What foods should I avoid? Fruits Fruit juice. Cooked, strained fruit. Vegetables Fried potatoes. Canned vegetables. Well-cooked vegetables. Grains White bread. Pasta made with refined flour. White rice. Meats and other proteins Fatty cuts of meat. Fried chicken or fried fish. Dairy Milk. Yogurt. Cream cheese. Sour cream. Fats and oils Butters. Beverages Soft drinks. Other foods Cakes and pastries. The items listed above may not be a complete list of foods and beverages to avoid. Talk with your dietitian about what choices are best for you. Summary  Fiber is a type of carbohydrate. It is found in foods such as fruits, vegetables, whole grains, and beans.  A high-fiber diet has many benefits.  It can help to prevent constipation, lower blood cholesterol, aid weight loss, and reduce your risk of heart disease, diabetes, and certain cancers.  Increase your intake of fiber gradually. Increasing fiber too quickly may cause cramping, bloating, and gas. Drink plenty of water while you increase the amount of fiber you consume.  The best sources of fiber include whole fruits and vegetables, whole grains, nuts, seeds, and beans. This information is not intended to replace advice given to you by your health care provider. Make sure you discuss any questions you have with your health care provider. Document Revised: 07/23/2019 Document Reviewed: 07/23/2019 Elsevier Patient Education  2021 [employer].

## 2020-06-08 ENCOUNTER — Other Ambulatory Visit: Payer: Self-pay | Admitting: Adult Health

## 2020-06-08 ENCOUNTER — Other Ambulatory Visit: Payer: Self-pay

## 2020-06-08 DIAGNOSIS — F988 Other specified behavioral and emotional disorders with onset usually occurring in childhood and adolescence: Secondary | ICD-10-CM

## 2020-06-08 DIAGNOSIS — F909 Attention-deficit hyperactivity disorder, unspecified type: Secondary | ICD-10-CM

## 2020-06-08 DIAGNOSIS — E0500 Thyrotoxicosis with diffuse goiter without thyrotoxic crisis or storm: Secondary | ICD-10-CM

## 2020-06-08 LAB — CBC WITH DIFFERENTIAL/PLATELET
Absolute Monocytes: 452 cells/uL (ref 200–950)
Basophils Absolute: 62 cells/uL (ref 0–200)
Basophils Relative: 1.2 %
Eosinophils Absolute: 161 cells/uL (ref 15–500)
Eosinophils Relative: 3.1 %
HCT: 40.9 % (ref 35.0–45.0)
Hemoglobin: 13.4 g/dL (ref 11.7–15.5)
Lymphs Abs: 1820 cells/uL (ref 850–3900)
MCH: 30.2 pg (ref 27.0–33.0)
MCHC: 32.8 g/dL (ref 32.0–36.0)
MCV: 92.3 fL (ref 80.0–100.0)
MPV: 11.7 fL (ref 7.5–12.5)
Monocytes Relative: 8.7 %
Neutro Abs: 2704 cells/uL (ref 1500–7800)
Neutrophils Relative %: 52 %
Platelets: 318 10*3/uL (ref 140–400)
RBC: 4.43 10*6/uL (ref 3.80–5.10)
RDW: 12.8 % (ref 11.0–15.0)
Total Lymphocyte: 35 %
WBC: 5.2 10*3/uL (ref 3.8–10.8)

## 2020-06-08 LAB — VITAMIN D 25 HYDROXY (VIT D DEFICIENCY, FRACTURES): Vit D, 25-Hydroxy: 47 ng/mL (ref 30–100)

## 2020-06-08 LAB — URINALYSIS, ROUTINE W REFLEX MICROSCOPIC
Bacteria, UA: NONE SEEN /HPF
Bilirubin Urine: NEGATIVE
Glucose, UA: NEGATIVE
Hyaline Cast: NONE SEEN /LPF
Ketones, ur: NEGATIVE
Leukocytes,Ua: NEGATIVE
Nitrite: NEGATIVE
Protein, ur: NEGATIVE
Specific Gravity, Urine: 1.026 (ref 1.001–1.03)
WBC, UA: NONE SEEN /HPF (ref 0–5)
pH: 5.5 (ref 5.0–8.0)

## 2020-06-08 LAB — COMPLETE METABOLIC PANEL WITH GFR
AG Ratio: 2 (calc) (ref 1.0–2.5)
ALT: 13 U/L (ref 6–29)
AST: 14 U/L (ref 10–30)
Albumin: 4.7 g/dL (ref 3.6–5.1)
Alkaline phosphatase (APISO): 89 U/L (ref 31–125)
BUN: 15 mg/dL (ref 7–25)
CO2: 27 mmol/L (ref 20–32)
Calcium: 9.3 mg/dL (ref 8.6–10.2)
Chloride: 106 mmol/L (ref 98–110)
Creat: 0.74 mg/dL (ref 0.50–1.10)
GFR, Est African American: 123 mL/min/{1.73_m2} (ref >=60)
GFR, Est Non African American: 106 mL/min/{1.73_m2} (ref >=60)
Globulin: 2.4 g/dL (calc) (ref 1.9–3.7)
Glucose, Bld: 86 mg/dL (ref 65–99)
Potassium: 5.3 mmol/L (ref 3.5–5.3)
Sodium: 143 mmol/L (ref 135–146)
Total Bilirubin: 0.3 mg/dL (ref 0.2–1.2)
Total Protein: 7.1 g/dL (ref 6.1–8.1)

## 2020-06-08 LAB — HEMOGLOBIN A1C
Hgb A1c MFr Bld: 4.9 % of total Hgb (ref <5.7)
Mean Plasma Glucose: 94 mg/dL
eAG (mmol/L): 5.2 mmol/L

## 2020-06-08 LAB — VITAMIN B12: Vitamin B-12: 731 pg/mL (ref 200–1100)

## 2020-06-08 LAB — TSH: TSH: 0.31 mIU/L — ABNORMAL LOW

## 2020-06-08 LAB — MAGNESIUM: Magnesium: 2.1 mg/dL (ref 1.5–2.5)

## 2020-06-08 MED ORDER — AMPHETAMINE-DEXTROAMPHETAMINE 30 MG PO TABS
ORAL_TABLET | ORAL | 0 refills | Status: DC
Start: 2020-06-08 — End: 2020-07-12

## 2020-07-12 ENCOUNTER — Other Ambulatory Visit: Payer: Self-pay

## 2020-07-12 DIAGNOSIS — F988 Other specified behavioral and emotional disorders with onset usually occurring in childhood and adolescence: Secondary | ICD-10-CM

## 2020-07-12 DIAGNOSIS — F909 Attention-deficit hyperactivity disorder, unspecified type: Secondary | ICD-10-CM

## 2020-07-13 MED ORDER — CYANOCOBALAMIN 1000 MCG/ML IJ SOLN: 1000.0000 ug | INTRAMUSCULAR | 0 refills | Status: DC

## 2020-07-13 MED ORDER — AMPHETAMINE-DEXTROAMPHETAMINE 30 MG PO TABS
ORAL_TABLET | ORAL | 0 refills | Status: DC
Start: 2020-07-13 — End: 2020-09-20

## 2020-09-20 ENCOUNTER — Other Ambulatory Visit: Payer: Self-pay

## 2020-09-20 DIAGNOSIS — F909 Attention-deficit hyperactivity disorder, unspecified type: Secondary | ICD-10-CM

## 2020-09-20 DIAGNOSIS — F988 Other specified behavioral and emotional disorders with onset usually occurring in childhood and adolescence: Secondary | ICD-10-CM

## 2020-09-22 MED ORDER — CYANOCOBALAMIN 1000 MCG/ML IJ SOLN: 1000.0000 ug | INTRAMUSCULAR | 0 refills | Status: DC

## 2020-09-22 MED ORDER — AMPHETAMINE-DEXTROAMPHETAMINE 30 MG PO TABS: ORAL_TABLET | ORAL | 0 refills | Status: DC

## 2020-10-23 ENCOUNTER — Other Ambulatory Visit: Payer: Self-pay

## 2020-10-23 DIAGNOSIS — F909 Attention-deficit hyperactivity disorder, unspecified type: Secondary | ICD-10-CM

## 2020-10-23 DIAGNOSIS — F988 Other specified behavioral and emotional disorders with onset usually occurring in childhood and adolescence: Secondary | ICD-10-CM

## 2020-10-24 MED ORDER — ALPRAZOLAM 0.5 MG PO TABS: ORAL_TABLET | ORAL | 0 refills | Status: DC

## 2020-10-24 MED ORDER — AMPHETAMINE-DEXTROAMPHETAMINE 30 MG PO TABS: ORAL_TABLET | ORAL | 0 refills | Status: DC

## 2020-10-24 MED ORDER — MELOXICAM 15 MG PO TABS: 15.0000 mg | ORAL_TABLET | Freq: Every day | ORAL | 0 refills | Status: DC

## 2020-12-15 ENCOUNTER — Ambulatory Visit: Payer: Managed Care, Other (non HMO) | Admitting: Adult Health

## 2020-12-27 ENCOUNTER — Other Ambulatory Visit: Payer: Self-pay | Admitting: Adult Health

## 2020-12-27 DIAGNOSIS — F988 Other specified behavioral and emotional disorders with onset usually occurring in childhood and adolescence: Secondary | ICD-10-CM

## 2020-12-27 DIAGNOSIS — F909 Attention-deficit hyperactivity disorder, unspecified type: Secondary | ICD-10-CM

## 2020-12-27 MED ORDER — AMPHETAMINE-DEXTROAMPHETAMINE 30 MG PO TABS: ORAL_TABLET | ORAL | 0 refills | Status: DC

## 2020-12-27 NOTE — Progress Notes (Signed)
Future Appointments  Date Time Provider Department Center  06/07/2021 10:00 AM Judd Gaudier, NP [facility] None    PDMP reviewed for adderall refill request.

## 2021-02-06 ENCOUNTER — Other Ambulatory Visit: Payer: Self-pay | Admitting: Adult Health

## 2021-02-06 ENCOUNTER — Other Ambulatory Visit: Payer: Self-pay

## 2021-02-06 DIAGNOSIS — F909 Attention-deficit hyperactivity disorder, unspecified type: Secondary | ICD-10-CM

## 2021-02-06 DIAGNOSIS — F988 Other specified behavioral and emotional disorders with onset usually occurring in childhood and adolescence: Secondary | ICD-10-CM

## 2021-02-06 MED ORDER — AMPHETAMINE-DEXTROAMPHETAMINE 30 MG PO TABS: ORAL_TABLET | ORAL | 0 refills | Status: DC

## 2021-02-07 ENCOUNTER — Other Ambulatory Visit: Payer: Self-pay | Admitting: Adult Health

## 2021-02-07 DIAGNOSIS — F988 Other specified behavioral and emotional disorders with onset usually occurring in childhood and adolescence: Secondary | ICD-10-CM

## 2021-02-07 DIAGNOSIS — F909 Attention-deficit hyperactivity disorder, unspecified type: Secondary | ICD-10-CM

## 2021-02-07 MED ORDER — AMPHETAMINE-DEXTROAMPHETAMINE 30 MG PO TABS: ORAL_TABLET | ORAL | 0 refills | Status: DC

## 2021-03-02 ENCOUNTER — Encounter: Payer: Managed Care, Other (non HMO) | Admitting: Adult Health

## 2021-04-17 ENCOUNTER — Encounter: Payer: Managed Care, Other (non HMO) | Admitting: Adult Health

## 2021-05-11 ENCOUNTER — Other Ambulatory Visit: Payer: Self-pay | Admitting: Adult Health

## 2021-05-11 DIAGNOSIS — F988 Other specified behavioral and emotional disorders with onset usually occurring in childhood and adolescence: Secondary | ICD-10-CM

## 2021-05-11 DIAGNOSIS — F909 Attention-deficit hyperactivity disorder, unspecified type: Secondary | ICD-10-CM

## 2021-05-11 MED ORDER — AMPHETAMINE-DEXTROAMPHETAMINE 30 MG PO TABS: ORAL_TABLET | ORAL | 0 refills | Status: DC

## 2021-06-06 NOTE — Progress Notes (Unsigned)
Complete Physical  Assessment and Plan:  Glendene was seen today for annual exam.  Diagnoses and all orders for this visit:  Encounter for Annual Physical Exam with abnormal findings Due annually  Health Maintenance reviewed Healthy lifestyle reviewed and goals set   Graves disease Est with Dr. Forde Dandy, monitor TSH, refer back if trending down -     TSH  Vitamin B12 deficiency Continue supplement  -     Vitamin B12  Vitamin D deficiency .Continue supplement  -     Vitamin D (25 hydroxy)  Anxiety state Improved with effexor; continue Stress management techniques discussed, increase water, good sleep hygiene discussed, increase exercise, and increase veggies.  -     venlafaxine XR (EFFEXOR XR) 150 MG 24 hr capsule; Take 1 tab by mouth daily for anxiety and mood. -     ALPRAZolam (XANAX) 0.5 MG tablet; Take 1/2-1 tab once or twice a day if needed only for panic attacks. Limit to <5 days per week to avoid tolerance.  OCP (oral contraceptive pills) management -     etonogestrel-ethinyl estradiol (NUVARING) 0.12-0.015 MG/24HR vaginal ring; Insert vaginally and leave in place for 3 consecutive weeks, then remove for 1 week.  Attention deficit disorder, unspecified hyperactivity presence Med helps with focus, no AE's. The patient was counseled on the addictive nature of the medication and was encouraged to take drug holidays when not needed. Monitor via PDMP.  -     amphetamine-dextroamphetamine (ADDERALL) 30 MG tablet; Take 1 tablet by mouth daily.  Screening for diabetes mellitus/ family history of diabetes mellitus -     HbA1C  Overweight (BMI 25.0-29.9) Long discussion about weight loss, diet, and exercise Recommended diet heavy in fruits and veggies and low in animal meats, cheeses, and dairy products, appropriate calorie intake Discussed appropriate weight for height Follow up at next visit -     Insulin, random  Irregular bowel habits Typically after flying; increase  fiber, increase water intake;   No orders of the defined types were placed in this encounter.    Discussed med's effects and SE's. Screening labs and tests as requested with regular follow-up as recommended. Over 40 minutes of exam, counseling, chart review, and complex, high level critical decision making was performed this visit.   Future Appointments  Date Time Provider [place]  06/07/2021 10:00 AM [name], NP [facility] None     HPI  36 y.o. female  presents for a complete physical and follow up for has Graves disease; ADHD (attention deficit hyperactivity disorder); Anxiety; Vitamin B12 deficiency; Vitamin D deficiency; and Insulin resistance on their problem list.   She works as a Catering manager.  She does have steady relationship with boyfriend.   She has hx of abnormal pap smear with LEEP procedure. Has been on Nuvaring. Hx of irregular and very heavy menses. She had normal PAP in our office 07/2016 and recommended 3 year follow up. GYN wasn't fully covered by insurance. ***  ? 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 Has been recommended surgical intervention but postponing.   She has history of Graves, with other family history of autoimmune. She had autoimmune workup in 2019 including ANA, sed rate, RF, anti DNA, HLA B27 antibody, RNP antibody, SSB (La) antibody, and SSA (Ro) antibody were unremarkable.  Vitamin D and B12 levels were found to be low and she was advised to  initiate supplementation.   Recent years with pandemic was having more depression/anxiety; She reported good support from boyfriend and friends. Hx of poor benefit with SSRIs (lexapro, zoloft, minimal benefit, poorly tolerated buspar), now on effexor and doing well taking 150 mg daily. She is also prescribed PRN xanax taking 0-1 tabs/week. Has seen numerous counselors in the past without  perceived benefit.   She is on adderall PRN for hx of ADD, takes on days that she works only, feels significant improvement with focus. She has not noted significant difference in anxiety on days that she uses vs not. We have discussed at length that adderall may exacerbate anxiety symptoms.    Sleep is unchanged from baseline; shift worker, manages fairly with melatonin.    BMI is There is no height or weight on file to calculate BMI., she admits diet hasn't been as good, trying to make better choices when able. Very clean eating when at home.  Wt Readings from Last 3 Encounters:  06/07/20 160 lb 12.8 oz (72.9 kg)  11/04/19 145 lb (65.8 kg)  02/25/19 160 lb 12.8 oz (72.9 kg)   Today their BP is   She does workout. She denies chest pain, shortness of breath, dizziness.   The cholesterol last visit was:   Lab Results  Component Value Date   CHOL 163 01/28/2018   HDL 67 01/28/2018   LDLCALC 75 01/28/2018   TRIG 129 01/28/2018   CHOLHDL 2.4 01/28/2018   Last A1C in the office was:  Lab Results  Component Value Date   HGBA1C 4.9 06/07/2020   She has documented history of Graves disease, formerly followed by Dr. Forde Dandy, did monitoring only, never was treated and advised monitoring only unless TSHs are persistently low and trending Lab Results  Component Value Date   TSH 0.31 (L) 06/07/2020   Last GFR: Lab Results  Component Value Date   [facility] 106 06/07/2020   Patient is on Vitamin D supplement, has been taking 50000 IU weekly Lab Results  Component Value Date   VD25OH 47 06/07/2020     She is on monthly B12 shots; last was 05/16/2020 Lab Results  Component Value Date   [id number] 119 06/07/2020    Current Medications:  Current Outpatient Medications on File Prior to Visit  Medication Sig Dispense Refill   ALPRAZolam (XANAX) 0.5 MG tablet Take 1/2-1 tab once or twice a day if needed only for panic attacks. Limit to <5 days per week to avoid tolerance. 60 tablet 0    amphetamine-dextroamphetamine (ADDERALL) 30 MG tablet Take 1 tablet daily as needed for Focus & Concentration; try to limit to <5 /week due to addictive potential. Should last longer than 30 days. 30 tablet 0   Cholecalciferol (VITAMIN D3) 1.25 MG (50000 UT) CAPS TAKE ONE CAPSULE BY MOUTH TWICE WEEKLY FOR 1 MONTH. THEN TAKE ONCE WEEKLY FOR SEVERE VITAMIN D DEFICIENCY. 16 capsule 0   cyanocobalamin (,VITAMIN B-12,) 1000 MCG/ML injection INJECT 1 ML INTO THE SKIN EVERY 30 DAYS 3 mL 0   etonogestrel-ethinyl estradiol (NUVARING) 0.12-0.015 MG/24HR vaginal ring Insert vaginally and leave in place for 3 consecutive weeks, then remove for 1 week. 3 each 1   meloxicam (MOBIC) 15 MG tablet Take 1 tablet (15 mg total) by mouth daily. 90 tablet 0   Multiple Vitamin (MULTIVITAMIN) tablet Take 1 tablet by mouth daily.     Probiotic Product (PROBIOTIC DAILY PO) Take 1 tablet by mouth daily.     venlafaxine XR (EFFEXOR XR) 150  MG 24 hr capsule Take 1 caps every Morning for Mood and Anxiety 90 capsule 3   Vitamin D, Ergocalciferol, (DRISDOL) 1.25 MG (50000 UNIT) CAPS capsule Take one tablet once weekly for severe Vitamin D deficiency 16 capsule 1   No current facility-administered medications on file prior to visit.   Allergies:  Allergies  Allergen Reactions   Codeine Nausea And Vomiting   Milk-Related Compounds Hives and Nausea And Vomiting   Sulfonamide Derivatives Nausea And Vomiting   Penicillins Nausea And Vomiting    PER PATIENT just nausea and vomiting   Medical History:  She has Graves disease; ADHD (attention deficit hyperactivity disorder); Anxiety; Vitamin B12 deficiency; Vitamin D deficiency; and Insulin resistance on their problem list. Health Maintenance:   Immunization History  Administered Date(s) Administered   HPV Quadrivalent 09/09/2001, 06/14/2005, 10/16/2005   Hepatitis B 04/03/1999   Meningococcal Polysaccharide 04/03/1999   Td 10/26/2004   Tdap 09/13/2014   Health Maintenance   Topic Date Due   COVID-19 Vaccine (1) Never done   PAP SMEAR-Modifier  07/12/2019   INFLUENZA VACCINE  10/31/2020   TETANUS/TDAP  09/12/2024   HPV VACCINES  Completed   HIV Screening  Completed   Hepatitis C Screening  Discontinued   Flu vaccine: declines HPV: 3/3 Covid 19: has had recurrent covid 19, had antibodies checked at work 2 weeks ago, was "very high"  LMP: No LMP recorded. Pap: 07/2016 neg HPV, DUE - declined last year on menstrual cycle, was planning to schedule GYN soon *** MGM: age 71  Colonoscopy: age 62  Last Dental Exam: Dr. Randel Pigg in [place], last 2021, q81mLast Eye Exam: [facility], last 2021, contacts Last [place] annually due to fam hx of melanoma, last fall 2021.    Patient Care Team: MUnk Pinto MD as PCP - General (Internal Medicine) GGatha Mayer MD as Consulting Physician (Gastroenterology) MMegan Salon MD as Consulting Physician (Gynecology) DDespina Hick MD (Dermatology)  Surgical History:  She has a past surgical history that includes Wisdom tooth extraction; gum grafts; and laparoscopy (02/19/2011). Family History:  Herfamily history includes Bladder Cancer (age of onset: 835 in her paternal grandfather; Breast cancer (age of onset: 553 in her maternal grandmother; Cancer in her paternal grandmother; Heart disease in her brother, maternal grandfather, maternal grandmother, paternal grandfather, and paternal grandmother; Heart murmur in her brother; Hyperlipidemia in her paternal grandfather; Hypertension in her paternal grandfather; Melanoma in her father, maternal uncle, and paternal grandfather; Pancreatic cancer in her maternal grandmother; Stroke (age of onset: 535 in her mother. Social History:  She reports that she has never smoked. She has never used smokeless tobacco. She reports that she does not drink alcohol and does not use drugs.   Review of Systems:  Review of Systems  Constitutional:  Negative for  malaise/fatigue and weight loss.  HENT:  Negative for hearing loss and tinnitus.   Eyes:  Negative for blurred vision and double vision.  Respiratory:  Negative for cough, sputum production, shortness of breath and wheezing.   Cardiovascular:  Negative for chest pain, palpitations, orthopnea, claudication, leg swelling and PND.  Gastrointestinal:  Positive for constipation (very irregular). Negative for abdominal pain, blood in stool, diarrhea, heartburn, melena, nausea and vomiting.  Genitourinary: Negative.   Musculoskeletal:  Negative for falls, joint pain and myalgias.  Skin:  Negative for rash.  Neurological:  Positive for headaches (sinus HA in airplane). Negative for dizziness, tingling, sensory change and weakness.  Endo/Heme/Allergies:  Positive for environmental allergies. Negative for polydipsia.  Psychiatric/Behavioral:  Negative for depression, memory loss, substance abuse and suicidal ideas. The patient is not nervous/anxious (improved by meds) and does not have insomnia.   All other systems reviewed and are negative.  Physical Exam: Estimated body mass index is 26.76 kg/m as calculated from the following:   Height as of 06/07/20: 5' 5" (1.651 m).   Weight as of 06/07/20: 160 lb 12.8 oz (72.9 kg). There were no vitals taken for this visit. General Appearance: Well nourished, in no apparent distress.  Eyes: PERRLA, EOMs, conjunctiva no swelling or erythema Sinuses: No Frontal/maxillary tenderness  ENT/Mouth: Ext aud canals clear, normal light reflex with TMs without erythema, bulging. Good dentition. No erythema, swelling, or exudate on post pharynx. Tonsils not swollen or erythematous. Hearing normal.  Neck: Supple, thyroid normal. No bruits  Respiratory: Respiratory effort normal, BS equal bilaterally without rales, rhonchi, wheezing or stridor.  Cardio: RRR without murmurs, rubs or gallops. Brisk peripheral pulses without edema.  Chest: symmetric, with normal excursions and  percussion.  Breasts: declines, will see GYN, doing self checks, no concerns Abdomen: Soft, nontender, no guarding, rebound, hernias, masses, or organomegaly.  Lymphatics: Non tender without lymphadenopathy.  Genitourinary: defer to GYN Musculoskeletal: Full ROM all peripheral extremities,5/5 strength, and normal gait.  Skin: Warm, dry without rashes, lesions, ecchymosis. Neuro: Cranial nerves intact, reflexes equal bilaterally. Normal muscle tone, no cerebellar symptoms. Sensation intact.  Psych: Awake and oriented X 3, normal affect, Insight and Judgment appropriate.   EKG: WNL no ST changes reviewed from 2019, defer this year   Izora Ribas, NP-C 8:45 AM [facility]

## 2021-06-07 ENCOUNTER — Encounter: Payer: Managed Care, Other (non HMO) | Admitting: Adult Health

## 2021-06-12 ENCOUNTER — Other Ambulatory Visit: Payer: Self-pay | Admitting: Nurse Practitioner

## 2021-06-12 ENCOUNTER — Other Ambulatory Visit: Payer: Self-pay | Admitting: Adult Health

## 2021-06-12 DIAGNOSIS — F909 Attention-deficit hyperactivity disorder, unspecified type: Secondary | ICD-10-CM

## 2021-06-12 DIAGNOSIS — F988 Other specified behavioral and emotional disorders with onset usually occurring in childhood and adolescence: Secondary | ICD-10-CM

## 2021-06-20 NOTE — Progress Notes (Signed)
Complete Physical ? ?Assessment and Plan: ? ?Braylea was seen today for annual exam. ? ?Diagnoses and all orders for this visit: ? ?Encounter for Annual Physical Exam with abnormal findings ?Due annually  ?Health Maintenance reviewed ?Healthy lifestyle reviewed and goals set ?- send covid 19 vaccine dates ?- pending overdue PAP with GYN ? ?Graves disease ?Est with Dr. Forde Dandy, monitor TSH, refer back if trending down ?-     TSH ? ?Vitamin B12 deficiency ?Continue supplement  ?-     Vitamin B12 ? ?Vitamin D deficiency ?Marland KitchenContinue supplement  ?-     Vitamin D (25 hydroxy) ? ?Anxiety state ?Improved, off of effexor; rare xanax use per PDMP review ?Stress management techniques discussed, increase water, good sleep hygiene discussed, increase exercise, and increase veggies.  ?-     ALPRAZolam (XANAX) 0.5 MG tablet; Take 1/2-1 tab once or twice a day if needed only for panic attacks. Limit to <5 days per week to avoid tolerance. ? ?Contraceptive management ?Script to cover until can get in with GYN ?-     etonogestrel-ethinyl estradiol (NUVARING) 0.12-0.015 MG/24HR vaginal ring; Insert vaginally and leave in place for 3 consecutive weeks, then remove for 1 week. ? ?Attention deficit disorder, unspecified hyperactivity presence ?Med helps with focus, no AE's. ?The patient was counseled on the addictive nature of the medication and was encouraged to take drug holidays when not needed. Monitor via PDMP.  ?-     amphetamine-dextroamphetamine (ADDERALL) 30 MG tablet; Take 1 tablet by mouth daily. ? ?Screening for diabetes mellitus/ family history of diabetes mellitus ?-     HbA1C ? ?Obesity - BMI 35 ?Weight trending up despite fairly good lifestyle habits today ?Check insulin for resistance, B12, TSH - Grave's might explain, if abnormal plan Dr. Forde Dandy follow up ?Denies difficulty controlling appetite ?Has tried caloric intake, appropriate  ?Consider repeat food log ?Long discussion about weight loss, diet, and  exercise ?Recommended diet heavy in fruits and veggies and low in animal meats, cheeses, and dairy products, appropriate calorie intake ?Discussed appropriate weight for height ?Follow up at next visit ?-     Insulin, random ?-     A1C ? ?Irregular bowel habits ?Typically after flying; increase fiber, increase water intake; continue exercise ? ?Orders Placed This Encounter  ?Procedures  ? C. trachomatis/N. gonorrhoeae RNA  ? CBC with Differential/Platelet  ? COMPLETE METABOLIC PANEL WITH GFR  ? Magnesium  ? TSH  ? VITAMIN D 25 Hydroxy (Vit-D Deficiency, Fractures)  ? Vitamin B12  ? Microalbumin / creatinine urine ratio  ? Urinalysis, Routine w reflex microscopic  ? Insulin, random  ? Hemoglobin A1c  ? RPR  ? HIV Antibody (routine testing w rflx)  ? HSV(herpes simplex vrs) 1+2 ab-IgG  ? ? ? ?Discussed med's effects and SE's. Screening labs and tests as requested with regular follow-up as recommended. ?Over 40 minutes of exam, counseling, chart review, and complex, high level critical decision making was performed this visit.  ? ?Future Appointments  ?Date Time Provider [place]  ?06/22/2022  9:00 AM [name], NP [facility] None  ? ? ? ?HPI  ?36 y.o. female  presents for a complete physical and follow up for has Graves disease; ADHD (attention deficit hyperactivity disorder); Anxiety; Vitamin B12 deficiency; Vitamin D deficiency; and Insulin resistance on their problem list.  ? 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 Does request STD testing.  ? ?She has hx of abnormal pap  smear with LEEP procedure. Has been on Nuvaring. Hx of irregular and very heavy menses. She is overdue PAP, last in 2018, pending with Dr. [name] this year.  ? 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 Has been recommended surgical intervention but postponing.  ? ?She has history of Graves,  with other family history of autoimmune. She had autoimmune workup in 2019 including ANA, sed rate, RF, anti DNA, HLA B27 antibody, RNP antibody, SSB (La) antibody, and SSA (Ro) antibody were unremarkable.  Vitamin D and B12 levels were found to be low and she was advised to initiate supplementation.  ? ?With pandemic was having more depression/anxiety; was much improved on effexor, but recently was able to taper off.  She is also prescribed PRN xanax taking 0-1 tabs/week. Has seen numerous counselors in the past without perceived benefit. 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  ? ?She is on adderall PRN for hx of ADD, takes on days that she works only, feels significant improvement with focus.  ?  ?BMI is Body mass index is 27.47 kg/m?., she admits diet hasn't been as good, trying to make better choices when able. Very clean eating when at home, salads at home, 1 protein drink with coffee in AM, otherwise water. Trying to get more exercise, walking and yoga.  ?Wt Readings from Last 3 Encounters:  ?06/21/21 167 lb 9.6 oz (76 kg)  ?06/07/20 160 lb 12.8 oz (72.9 kg)  ?11/04/19 145 lb (65.8 kg)  ? ?Today their BP is BP: 110/72 ?She does workout. She denies chest pain, shortness of breath, dizziness.  ? ?The cholesterol last visit was:   ?Lab Results  ?Component Value Date  ? CHOL 163 01/28/2018  ? HDL 67 01/28/2018  ? [place] 75 01/28/2018  ? TRIG 129 01/28/2018  ? CHOLHDL 2.4 01/28/2018  ? ?Last A1C in the office was:  ?Lab Results  ?Component Value Date  ? HGBA1C 4.9 06/07/2020  ? ?She has documented history of Graves disease, formerly followed by Dr. Forde Dandy, did monitoring only, never was treated and advised monitoring only unless TSHs are persistently low and trending ?Lab Results  ?Component Value Date  ? TSH 0.31 (L) 06/07/2020  ? ?Last GFR: ?Lab Results  ?Component Value Date  ? GFRNONAA 106 06/07/2020  ? ?Patient is on Vitamin D supplement, has been taking 50000 IU  weekly ?Lab Results  ?Component Value Date  ? VD25OH 47 06/07/2020  ?   ?She is on monthly B12 shots;  ?Lab Results  ?Component Value Date  ? [id number] 731 06/07/2020  ? ? ? ? ?Current Medications:  ?Current Outpatient Medications on File Prior to Visit  ?Medication Sig Dispense Refill  ? cyanocobalamin (,VITAMIN B-12,) 1000 MCG/ML injection INJECT 1 ML INTO THE SKIN EVERY 30 DAYS 3 mL 0  ? meloxicam (MOBIC) 15 MG tablet Take 1 tablet (15 mg total) by mouth daily. 90 tablet 0  ? Multiple Vitamin (MULTIVITAMIN) tablet Take 1 tablet by mouth daily.    ? Probiotic Product (PROBIOTIC DAILY PO) Take 1 tablet by mouth daily.    ? ?No current facility-administered medications on file prior to visit.  ? ?Allergies:  ?Allergies  ?Allergen Reactions  ? Codeine Nausea And Vomiting  ? Milk-Related Compounds Hives and Nausea And Vomiting  ? Sulfonamide Derivatives Nausea And Vomiting  ? Penicillins Nausea And Vomiting  ?  PER PATIENT just  nausea and vomiting  ? ?Medical History:  ?She has Graves disease; ADHD (attention deficit hyperactivity disorder); Anxiety; Vitamin B12 deficiency; Vitamin D deficiency; and Insulin resistance on their problem list. ?Health Maintenance:   ?Immunization History  ?Administered Date(s) Administered  ? HPV Quadrivalent 09/09/2001, 06/14/2005, 10/16/2005  ? Hepatitis B 04/03/1999  ? Meningococcal Polysaccharide 04/03/1999  ? Td 10/26/2004  ? Tdap 09/13/2014  ? ?Health Maintenance  ?Topic Date Due  ? COVID-19 Vaccine (1) Never done  ? PAP SMEAR-Modifier  07/12/2019  ? INFLUENZA VACCINE  10/31/2020  ? TETANUS/TDAP  09/12/2024  ? HPV VACCINES  Completed  ? HIV Screening  Completed  ? Hepatitis C Screening  Discontinued  ? ?Health Maintenance  ?Topic Date Due  ? COVID-19 Vaccine (1) Never done  ? PAP SMEAR-Modifier  07/12/2019  ? INFLUENZA VACCINE  10/31/2020  ? TETANUS/TDAP  09/12/2024  ? HPV VACCINES  Completed  ? HIV Screening  Completed  ? Hepatitis C Screening  Discontinued  ? ? ? ?Flu vaccine:  declines ?HPV: 3/3 ?Covid 19: got 2/2, dates pending ? ?LMP: No LMP recorded. ?Pap: 07/2016 neg HPV, DUE - will schedule with GYN Dr. Sabra Heck ?MGM: age 53 ? ?Colonoscopy: age 50 ? ?Last Dental Exam: Dr. Randel Pigg in [place], last 2023, q47m ?

## 2021-06-21 ENCOUNTER — Ambulatory Visit (INDEPENDENT_AMBULATORY_CARE_PROVIDER_SITE_OTHER): Payer: Managed Care, Other (non HMO) | Admitting: Adult Health

## 2021-06-21 ENCOUNTER — Other Ambulatory Visit: Payer: Self-pay

## 2021-06-21 ENCOUNTER — Encounter: Payer: Self-pay | Admitting: Adult Health

## 2021-06-21 VITALS — BP 110/72 | HR 88 | Temp 97.7°F | Ht 65.5 in | Wt 167.6 lb

## 2021-06-21 DIAGNOSIS — Z79899 Other long term (current) drug therapy: Secondary | ICD-10-CM

## 2021-06-21 DIAGNOSIS — Z13228 Encounter for screening for other metabolic disorders: Secondary | ICD-10-CM

## 2021-06-21 DIAGNOSIS — Z113 Encounter for screening for infections with a predominantly sexual mode of transmission: Secondary | ICD-10-CM

## 2021-06-21 DIAGNOSIS — E559 Vitamin D deficiency, unspecified: Secondary | ICD-10-CM

## 2021-06-21 DIAGNOSIS — Z0000 Encounter for general adult medical examination without abnormal findings: Secondary | ICD-10-CM | POA: Diagnosis not present

## 2021-06-21 DIAGNOSIS — F909 Attention-deficit hyperactivity disorder, unspecified type: Secondary | ICD-10-CM

## 2021-06-21 DIAGNOSIS — Z13220 Encounter for screening for lipoid disorders: Secondary | ICD-10-CM | POA: Diagnosis not present

## 2021-06-21 DIAGNOSIS — F988 Other specified behavioral and emotional disorders with onset usually occurring in childhood and adolescence: Secondary | ICD-10-CM

## 2021-06-21 DIAGNOSIS — E538 Deficiency of other specified B group vitamins: Secondary | ICD-10-CM

## 2021-06-21 DIAGNOSIS — Z1389 Encounter for screening for other disorder: Secondary | ICD-10-CM | POA: Diagnosis not present

## 2021-06-21 DIAGNOSIS — Z30011 Encounter for initial prescription of contraceptive pills: Secondary | ICD-10-CM

## 2021-06-21 DIAGNOSIS — R5383 Other fatigue: Secondary | ICD-10-CM

## 2021-06-21 DIAGNOSIS — F419 Anxiety disorder, unspecified: Secondary | ICD-10-CM

## 2021-06-21 DIAGNOSIS — Z130 Encounter for screening for diseases of the blood and blood-forming organs and certain disorders involving the immune mechanism: Secondary | ICD-10-CM

## 2021-06-21 DIAGNOSIS — Z0001 Encounter for general adult medical examination with abnormal findings: Secondary | ICD-10-CM

## 2021-06-21 DIAGNOSIS — Z131 Encounter for screening for diabetes mellitus: Secondary | ICD-10-CM

## 2021-06-21 DIAGNOSIS — E8881 Metabolic syndrome: Secondary | ICD-10-CM

## 2021-06-21 DIAGNOSIS — E0500 Thyrotoxicosis with diffuse goiter without thyrotoxic crisis or storm: Secondary | ICD-10-CM

## 2021-06-21 MED ORDER — VITAMIN D3 1.25 MG (50000 UT) PO CAPS: 1.0000 | ORAL_CAPSULE | ORAL | 3 refills | Status: DC

## 2021-06-21 MED ORDER — ALPRAZOLAM 0.5 MG PO TABS
ORAL_TABLET | ORAL | 0 refills | Status: DC
Start: 2021-06-21 — End: 2022-07-06

## 2021-06-21 MED ORDER — AMPHETAMINE-DEXTROAMPHETAMINE 30 MG PO TABS: ORAL_TABLET | ORAL | 0 refills | Status: DC

## 2021-06-21 MED ORDER — FLUCONAZOLE 150 MG PO TABS: 150.0000 mg | ORAL_TABLET | Freq: Once | ORAL | 3 refills | Status: AC

## 2021-06-21 MED ORDER — ETONOGESTREL-ETHINYL ESTRADIOL 0.12-0.015 MG/24HR VA RING: VAGINAL_RING | VAGINAL | 1 refills | Status: DC

## 2021-06-21 NOTE — Patient Instructions (Addendum)
?Ms. Gemmell , ?Thank you for taking time to come for your Annual Wellness Visit. I appreciate your ongoing commitment to your health goals. Please review the following plan we discussed and let me know if I can assist you in the future.  ? ?These are the goals we discussed: ? Goals   ? ?  Weight (lb) < 150 lb (68 kg)   ? ?  ?  ?This is a list of the screening recommended for you and due dates:  ?Health Maintenance  ?Topic Date Due  ? COVID-19 Vaccine (1) Never done  ? Pap Smear  07/12/2019  ? Flu Shot  10/31/2020  ? Tetanus Vaccine  09/12/2024  ? HPV Vaccine  Completed  ? HIV Screening  Completed  ? Hepatitis C Screening: USPSTF Recommendation to screen - Ages 45-79 yo.  Discontinued  ? ? ? ? ?Know what a healthy weight is for you (roughly BMI <25) and aim to maintain this ? ?Aim for 7+ servings of fruits and vegetables daily ? ?65-80+ fluid ounces of water or unsweet tea for healthy kidneys ? ?Limit to max 1 drink of alcohol per day; avoid smoking/tobacco ? ?Limit animal fats in diet for cholesterol and heart health - choose grass fed whenever available ? ?Avoid highly processed foods, and foods high in saturated/trans fats ? ?Aim for low stress - take time to unwind and care for your mental health ? ?Aim for 150 min of moderate intensity exercise weekly for heart health, and weights twice weekly for bone health ? ?Aim for 7-9 hours of sleep daily ? ? ? ? ?High-Fiber Eating Plan ?Fiber, also called dietary fiber, is a type of carbohydrate. It is found foods such as fruits, vegetables, whole grains, and beans. A high-fiber diet can have many health benefits. Your health care provider may recommend a high-fiber diet to help: ?Prevent constipation. Fiber can make your bowel movements more regular. ?Lower your cholesterol. ?Relieve the following conditions: ?Inflammation of veins in the anus (hemorrhoids). ?Inflammation of specific areas of the digestive tract (uncomplicated diverticulosis). ?A problem of the large  intestine, also called the colon, that sometimes causes pain and diarrhea (irritable bowel syndrome, or IBS). ?Prevent overeating as part of a weight-loss plan. ?Prevent heart disease, type 2 diabetes, and certain cancers. ?What are tips for following this plan? ?Reading food labels ? ?Check the nutrition facts label on food products for the amount of dietary fiber. Choose foods that have 5 grams of fiber or more per serving. ?The goals for recommended daily fiber intake include: ?Men (age 64 or younger): 34-38 g. ?Men (over age 55): 28-34 g. ?Women (age 41 or younger): 25-28 g. ?Women (over age 78): 22-25 g. ?Your daily fiber goal is _____________ g. ?Shopping ?Choose whole fruits and vegetables instead of processed forms, such as apple juice or applesauce. ?Choose a wide variety of high-fiber foods such as avocados, lentils, oats, and kidney beans. ?Read the nutrition facts label of the foods you choose. Be aware of foods with added fiber. These foods often have high sugar and sodium amounts per serving. ?Cooking ?Use whole-grain flour for baking and cooking. ?Cook with brown rice instead of white rice. ?Meal planning ?Start the day with a breakfast that is high in fiber, such as a cereal that contains 5 g of fiber or more per serving. ?Eat breads and cereals that are made with whole-grain flour instead of refined flour or white flour. ?Eat brown rice, bulgur wheat, or millet instead of white rice. ?  Use beans in place of meat in soups, salads, and pasta dishes. ?Be sure that half of the grains you eat each day are whole grains. ?General information ?You can get the recommended daily intake of dietary fiber by: ?Eating a variety of fruits, vegetables, grains, nuts, and beans. ?Taking a fiber supplement if you are not able to take in enough fiber in your diet. It is better to get fiber through food than from a supplement. ?Gradually increase how much fiber you consume. If you increase your intake of dietary fiber  too quickly, you may have bloating, cramping, or gas. ?Drink plenty of water to help you digest fiber. ?Choose high-fiber snacks, such as berries, raw vegetables, nuts, and popcorn. ?What foods should I eat? ?Fruits ?Berries. Pears. Apples. Oranges. Avocado. Prunes and raisins. Dried figs. ?Vegetables ?Sweet potatoes. Spinach. Kale. Artichokes. Cabbage. Broccoli. Cauliflower. Green peas. Carrots. Squash. ?Grains ?Whole-grain breads. Multigrain cereal. Oats and oatmeal. Brown rice. Barley. Bulgur wheat. [place]. Quinoa. Bran muffins. Popcorn. Rye wafer crackers. ?Meats and other proteins ?Navy beans, kidney beans, and pinto beans. Soybeans. Split peas. Lentils. Nuts and seeds. ?Dairy ?Fiber-fortified yogurt. ?Beverages ?Fiber-fortified soy milk. Fiber-fortified orange juice. ?Other foods ?Fiber bars. ?The items listed above may not be a complete list of recommended foods and beverages. Contact a dietitian for more information. ?What foods should I avoid? ?Fruits ?Fruit juice. Cooked, strained fruit. ?Vegetables ?Fried potatoes. Canned vegetables. Well-cooked vegetables. ?Grains ?White bread. Pasta made with refined flour. White rice. ?Meats and other proteins ?Fatty cuts of meat. Fried chicken or fried fish. ?Dairy ?Milk. Yogurt. Cream cheese. Sour cream. ?Fats and oils ?Butters. ?Beverages ?Soft drinks. ?Other foods ?Cakes and pastries. ?The items listed above may not be a complete list of foods and beverages to avoid. Talk with your dietitian about what choices are best for you. ?Summary ?Fiber is a type of carbohydrate. It is found in foods such as fruits, vegetables, whole grains, and beans. ?A high-fiber diet has many benefits. It can help to prevent constipation, lower blood cholesterol, aid weight loss, and reduce your risk of heart disease, diabetes, and certain cancers. ?Increase your intake of fiber gradually. Increasing fiber too quickly may cause cramping, bloating, and gas. Drink plenty of water while you  increase the amount of fiber you consume. ?The best sources of fiber include whole fruits and vegetables, whole grains, nuts, seeds, and beans. ?This information is not intended to replace advice given to you by your health care provider. Make sure you discuss any questions you have with your health care provider. ?Document Revised: 07/23/2019 Document Reviewed: 07/23/2019 ?Elsevier Patient Education ? [place]. ? ?

## 2021-06-22 ENCOUNTER — Encounter: Payer: Self-pay | Admitting: Adult Health

## 2021-06-22 ENCOUNTER — Other Ambulatory Visit: Payer: Self-pay | Admitting: Adult Health

## 2021-06-22 LAB — URINALYSIS, ROUTINE W REFLEX MICROSCOPIC
Bilirubin Urine: NEGATIVE
Glucose, UA: NEGATIVE
Hgb urine dipstick: NEGATIVE
Ketones, ur: NEGATIVE
Leukocytes,Ua: NEGATIVE
Nitrite: NEGATIVE
Protein, ur: NEGATIVE
Specific Gravity, Urine: 1.012 (ref 1.001–1.035)
pH: 6 (ref 5.0–8.0)

## 2021-06-22 LAB — COMPLETE METABOLIC PANEL WITH GFR
AG Ratio: 1.8 (calc) (ref 1.0–2.5)
ALT: 11 U/L (ref 6–29)
AST: 14 U/L (ref 10–30)
Albumin: 4.4 g/dL (ref 3.6–5.1)
Alkaline phosphatase (APISO): 79 U/L (ref 31–125)
BUN: 12 mg/dL (ref 7–25)
CO2: 23 mmol/L (ref 20–32)
Calcium: 9.2 mg/dL (ref 8.6–10.2)
Chloride: 105 mmol/L (ref 98–110)
Creat: 0.69 mg/dL (ref 0.50–0.97)
Globulin: 2.5 g/dL (calc) (ref 1.9–3.7)
Glucose, Bld: 90 mg/dL (ref 65–99)
Potassium: 4.1 mmol/L (ref 3.5–5.3)
Sodium: 138 mmol/L (ref 135–146)
Total Bilirubin: 0.3 mg/dL (ref 0.2–1.2)
Total Protein: 6.9 g/dL (ref 6.1–8.1)
eGFR: 116 mL/min/{1.73_m2} (ref >=60)

## 2021-06-22 LAB — CBC WITH DIFFERENTIAL/PLATELET
Absolute Monocytes: 413 cells/uL (ref 200–950)
Basophils Absolute: 41 cells/uL (ref 0–200)
Basophils Relative: 0.7 %
Eosinophils Absolute: 142 cells/uL (ref 15–500)
Eosinophils Relative: 2.4 %
HCT: 37.4 % (ref 35.0–45.0)
Hemoglobin: 12.4 g/dL (ref 11.7–15.5)
Lymphs Abs: 1764 cells/uL (ref 850–3900)
MCH: 30.1 pg (ref 27.0–33.0)
MCHC: 33.2 g/dL (ref 32.0–36.0)
MCV: 90.8 fL (ref 80.0–100.0)
MPV: 12.2 fL (ref 7.5–12.5)
Monocytes Relative: 7 %
Neutro Abs: 3540 cells/uL (ref 1500–7800)
Neutrophils Relative %: 60 %
Platelets: 292 10*3/uL (ref 140–400)
RBC: 4.12 10*6/uL (ref 3.80–5.10)
RDW: 12.3 % (ref 11.0–15.0)
Total Lymphocyte: 29.9 %
WBC: 5.9 10*3/uL (ref 3.8–10.8)

## 2021-06-22 LAB — VITAMIN D 25 HYDROXY (VIT D DEFICIENCY, FRACTURES): Vit D, 25-Hydroxy: 16 ng/mL — ABNORMAL LOW (ref 30–100)

## 2021-06-22 LAB — HEMOGLOBIN A1C
Hgb A1c MFr Bld: 4.9 % of total Hgb (ref <5.7)
Mean Plasma Glucose: 94 mg/dL
eAG (mmol/L): 5.2 mmol/L

## 2021-06-22 LAB — MICROALBUMIN / CREATININE URINE RATIO
Creatinine, Urine: 73 mg/dL (ref 20–275)
Microalb, Ur: <0.2 mg/dL

## 2021-06-22 LAB — HIV ANTIBODY (ROUTINE TESTING W REFLEX): HIV 1&2 Ab, 4th Generation: NONREACTIVE

## 2021-06-22 LAB — C. TRACHOMATIS/N. GONORRHOEAE RNA
C. trachomatis RNA, TMA: NOT DETECTED
N. gonorrhoeae RNA, TMA: NOT DETECTED

## 2021-06-22 LAB — TSH: TSH: 0.86 mIU/L

## 2021-06-22 LAB — HSV(HERPES SIMPLEX VRS) I + II AB-IGG
HAV 1 IGG,TYPE SPECIFIC AB: 32.9 index — ABNORMAL HIGH
HSV 2 IGG,TYPE SPECIFIC AB: <0.9 index

## 2021-06-22 LAB — VITAMIN B12: Vitamin B-12: 513 pg/mL (ref 200–1100)

## 2021-06-22 LAB — INSULIN, RANDOM: Insulin: 14.6 u[IU]/mL

## 2021-06-22 LAB — RPR: RPR Ser Ql: NONREACTIVE

## 2021-06-22 LAB — MAGNESIUM: Magnesium: 2.1 mg/dL (ref 1.5–2.5)

## 2021-06-22 MED ORDER — CYANOCOBALAMIN 1000 MCG/ML IJ SOLN: INTRAMUSCULAR | 3 refills | Status: DC

## 2021-08-09 ENCOUNTER — Other Ambulatory Visit: Payer: Self-pay | Admitting: Adult Health

## 2021-08-09 DIAGNOSIS — F909 Attention-deficit hyperactivity disorder, unspecified type: Secondary | ICD-10-CM

## 2021-08-09 DIAGNOSIS — F988 Other specified behavioral and emotional disorders with onset usually occurring in childhood and adolescence: Secondary | ICD-10-CM

## 2021-08-10 MED ORDER — AMPHETAMINE-DEXTROAMPHETAMINE 30 MG PO TABS: ORAL_TABLET | ORAL | 0 refills | Status: DC

## 2021-08-10 MED ORDER — MELOXICAM 15 MG PO TABS: 15.0000 mg | ORAL_TABLET | Freq: Every day | ORAL | 0 refills | Status: DC

## 2021-11-20 ENCOUNTER — Telehealth: Payer: Self-pay | Admitting: Nurse Practitioner

## 2021-11-20 ENCOUNTER — Other Ambulatory Visit: Payer: Self-pay | Admitting: Nurse Practitioner

## 2021-11-20 DIAGNOSIS — F988 Other specified behavioral and emotional disorders with onset usually occurring in childhood and adolescence: Secondary | ICD-10-CM

## 2021-11-20 DIAGNOSIS — F909 Attention-deficit hyperactivity disorder, unspecified type: Secondary | ICD-10-CM

## 2021-11-20 MED ORDER — AMPHETAMINE-DEXTROAMPHETAMINE 30 MG PO TABS: ORAL_TABLET | ORAL | 0 refills | Status: DC

## 2021-11-20 NOTE — Telephone Encounter (Signed)
Patient is requesting refill on Adderall to [facility] on [address].

## 2021-12-25 ENCOUNTER — Ambulatory Visit: Payer: Managed Care, Other (non HMO) | Admitting: Nurse Practitioner

## 2022-01-02 ENCOUNTER — Other Ambulatory Visit: Payer: Self-pay | Admitting: Nurse Practitioner

## 2022-01-02 DIAGNOSIS — F909 Attention-deficit hyperactivity disorder, unspecified type: Secondary | ICD-10-CM

## 2022-01-02 DIAGNOSIS — F988 Other specified behavioral and emotional disorders with onset usually occurring in childhood and adolescence: Secondary | ICD-10-CM

## 2022-01-09 ENCOUNTER — Ambulatory Visit (INDEPENDENT_AMBULATORY_CARE_PROVIDER_SITE_OTHER): Payer: Managed Care, Other (non HMO) | Admitting: Nurse Practitioner

## 2022-01-09 ENCOUNTER — Ambulatory Visit
Admission: RE | Admit: 2022-01-09 | Discharge: 2022-01-09 | Disposition: A | Discharge status: Home / Self Care | Payer: Managed Care, Other (non HMO) | Source: Ambulatory Visit | Attending: Nurse Practitioner | Admitting: Nurse Practitioner

## 2022-01-09 ENCOUNTER — Encounter: Payer: Self-pay | Admitting: Nurse Practitioner

## 2022-01-09 VITALS — BP 120/88 | HR 90 | Temp 97.7°F | Ht 65.0 in | Wt 153.0 lb

## 2022-01-09 DIAGNOSIS — Z30011 Encounter for initial prescription of contraceptive pills: Secondary | ICD-10-CM

## 2022-01-09 DIAGNOSIS — U099 Post covid-19 condition, unspecified: Secondary | ICD-10-CM

## 2022-01-09 DIAGNOSIS — R5382 Chronic fatigue, unspecified: Secondary | ICD-10-CM

## 2022-01-09 DIAGNOSIS — E0500 Thyrotoxicosis with diffuse goiter without thyrotoxic crisis or storm: Secondary | ICD-10-CM

## 2022-01-09 DIAGNOSIS — R002 Palpitations: Secondary | ICD-10-CM

## 2022-01-09 DIAGNOSIS — E559 Vitamin D deficiency, unspecified: Secondary | ICD-10-CM | POA: Diagnosis not present

## 2022-01-09 DIAGNOSIS — E538 Deficiency of other specified B group vitamins: Secondary | ICD-10-CM

## 2022-01-09 DIAGNOSIS — R0609 Other forms of dyspnea: Secondary | ICD-10-CM

## 2022-01-09 DIAGNOSIS — F909 Attention-deficit hyperactivity disorder, unspecified type: Secondary | ICD-10-CM

## 2022-01-09 DIAGNOSIS — R0602 Shortness of breath: Secondary | ICD-10-CM

## 2022-01-09 DIAGNOSIS — Z79899 Other long term (current) drug therapy: Secondary | ICD-10-CM

## 2022-01-09 DIAGNOSIS — E669 Obesity, unspecified: Secondary | ICD-10-CM

## 2022-01-09 NOTE — Patient Instructions (Signed)
Chronic Fatigue Syndrome ?Chronic fatigue syndrome (CFS) is a condition that causes extreme tiredness (fatigue). This condition is also known as myalgic encephalomyelitis (ME). The fatigue in CFS does not improve with rest, and it gets worse with physical or mental activity. Several other symptoms may occur along with fatigue. ?Symptoms may come and go, but they generally last for months. Sometimes, CFS gets better over time. In other cases, it can be a lifelong condition. There is no cure, but there are many possible treatments. You will need to work with your health care providers to find a treatment plan that works best for you. ?What are the causes? ?The cause of this condition is not known. CFS may be caused by a combination of things. Possible causes include: ?An infection. ?An abnormal body defense system (abnormal immune system). ?Changes in how your body produces energy. ?Genetic factors. ?Physical or emotional stress. ?Having a poor diet. ?What increases the risk? ?You are more likely to develop this condition if: ?You are female. ?You are a young adult or middle-aged adult. ?You have a family history of CFS. ?What are the signs or symptoms? ?The main symptom of this condition is fatigue that is severe enough to interfere with day-to-day activities. This fatigue does not get better with rest, and it gets worse with physical or mental activity. There are eight other major symptoms of CFS: ?Discomfort and lack of energy (malaise) that lasts more than 24 hours after physical activity. ?Sleep that does not relieve fatigue (unrefreshing sleep). ?Short-term memory loss or confusion. ?Joint pain without redness or swelling. ?Muscle aches. ?Headaches. ?Painful and swollen glands (lymph nodes) in the neck or under the arms. ?Sore throat. ?Other symptoms can include: ?Cramps in the abdomen, constipation, or diarrhea (irritable bowel syndrome). ?Chills and night sweats. ?Vision changes. ?Dizziness and mental  confusion (brain fog). ?Clumsiness. ?Sensitivity to food, noise, or odors. ?Mood swings, depression, or anxiety attacks. ?How is this diagnosed? ?There are no tests that can diagnose this condition. Your health care provider will make the diagnosis based on: ?Your symptoms and medical history. ?A physical exam and a mental health exam. ?Tests to rule out other conditions. It is important to make sure that your symptoms are not caused by another medical condition. Tests may include lab tests or X-rays. ?For your health care provider to diagnose CFS: ?You must have had fatigue for at least 6 months in a row. ?Fatigue must be your first symptom, and it must be severe enough to interfere with day-to-day activities. ?You must also have at least four of the eight other major symptoms of CFS. ?There must be no other cause found for the fatigue. ?How is this treated? ?There is no cure for CFS. The condition affects everyone differently. You will need to work with your team of health care providers to find the best treatments for your symptoms. Your team may include your primary care provider, physical and exercise therapists, and mental health therapists. Treatment may include: ?Having a regular bedtime routine to help improve your sleep. ?Avoiding caffeine and alcohol. ?Doing light exercise and stretching during the day. You may also want to try movement exercises, such as yoga or tai chi. ?Taking medicines to help you sleep or to relieve joint or muscle pain. ?Learning and practicing relaxation techniques, such as deep breathing and muscle relaxation. ?Using memory aids or doing puzzles to improve memory and concentration. ?Getting care for your body and mental well-being, such as: ?Seeing a mental health therapist to evaluate and   treat depression, if necessary. ?Cognitive behavioral therapy (CBT). This therapy changes the way you think or act in response to the fatigue. This may help improve how you feel. ?Trying massage  therapy and acupuncture. ?Follow these instructions at home: ?Eating and drinking ? ?Avoid caffeine and alcohol. ?Avoid heavy meals in the evening. ?Eat a healthy diet that includes foods such as vegetables, fruits, fish, and lean meats. ?Activity ?Rest as told by your health care provider. ?Avoid fatigue by pacing yourself during the day and getting enough sleep at night. ?Exercise as told by your health care provider. ?Go to bed and get up at the same time every day. ?Lifestyle ?Ask your health care provider whether you should keep a journal. Your health care provider will tell you what information to write in the journal. This may include when you have fatigue and how medicines and other behaviors or treatments help to reduce the fatigue. ?Consider joining a CFS support group. ?Avoid stress, and use stress-reducing techniques that you learn in therapy. ?General instructions ?Take over-the-counter and prescription medicines only as told by your health care provider. ?Do not use herbal or dietary supplements unless they are approved by your health care provider. ?Maintain a healthy weight. ?Keep all follow-up visits. This is important. ?Where to find more information ?Get more information or find a support group near you at one of these links: ?American Myalgic Encephalomyelitis and Chronic Fatigue Syndrome Society: ammes.org ?Centers for Disease Control and Prevention: www.cdc.gov ?Contact a health care provider if: ?Your symptoms do not get better or they get worse. ?You feel angry, guilty, anxious, or depressed. ?Get help right away if: ?You have thoughts about hurting yourself or others. ?Get help right away if you feel like you may hurt yourself or others, or have thoughts about taking your own life. Go to your nearest emergency room or: ?Call 911. ?Call the National Suicide Prevention Lifeline at 1-800-273-8255 or 988. This is open 24 hours a day. ?Text the Crisis Text Line at 741741. ?Summary ?Chronic  fatigue syndrome (CFS) is a condition that causes extreme tiredness (fatigue). This fatigue does not improve with rest, and it gets worse with physical or mental activity. ?There is no cure for CFS. The condition affects everyone differently. You will need to work with your team of health care providers to find the best treatments for your symptoms. ?Avoid stress, and use stress-reducing techniques that you learn in therapy. ?Contact a health care provider if your symptoms do not get better or they get worse. ?This information is not intended to replace advice given to you by your health care provider. Make sure you discuss any questions you have with your health care provider. ?Document Revised: 01/09/2021 Document Reviewed: 01/09/2021 ?Elsevier Patient Education ? 2023 [employer] ? ?

## 2022-01-09 NOTE — Progress Notes (Signed)
Follow Up  Assessment and Plan:  Sara Brewer was seen today for annual exam.  Diagnoses and all orders for this visit:  Graves disease Est with Dr. Forde Brewer, monitor TSH, refer back if trending down   Vitamin B12 deficiency Continue supplement  Monitor levels  Vitamin D deficiency Continue supplement  Monitor levels  Anxiety state Rare xanax use per PDMP review Stress management techniques discussed, increase water, good sleep hygiene discussed, increase exercise, and increase veggies.    Contraceptive management Continue Nuvaring  Attention deficit disorder, unspecified hyperactivity presence Med helps with focus, no AE's. The patient was counseled on the addictive nature of the medication and was encouraged to take drug holidays when not needed. Monitor via PDMP.    Obesity - BMI 35 Discussed appropriate BMI Diet modification. Physical activity. Encouraged/praised to build confidence.  Medication management All medications discussed and reviewed in full. All questions and concerns regarding medications addressed.    Chronic fatigue Obtain EBV level considering past Mono and recent Covid infection.  Post covid syndrome Continue to monitor for worsening symptoms  SOB/DOE CXR to assess for any underlying contributor.  Palpitations Ziopatch cardiac holter monitor. Possible referral to cardiology  Orders Placed This Encounter  Procedures   DG Chest 2 View    Standing Status:   Future    Number of Occurrences:   1    Standing Expiration Date:   01/10/2023    Order Specific Question:   Reason for Exam (SYMPTOM  OR DIAGNOSIS REQUIRED)    Answer:   DOE, SOB, chronic fatigue, palpitations, Covid 10/2021    Order Specific Question:   Is patient pregnant?    Answer:   No    Comments:   LMP 01/06/22    Order Specific Question:   Preferred imaging location?    Answer:   [facility]   CBC with Differential/Platelet   COMPLETE METABOLIC PANEL WITH GFR   VITAMIN D  25 Hydroxy (Vit-D Deficiency, Fractures)   Epstein-Barr Virus VCA Antibody Panel- [facility]   TSH   LONG TERM MONITOR (3-14 DAYS)    Standing Status:   Future    Number of Occurrences:   1    Standing Expiration Date:   01/10/2023    Order Specific Question:   Where should this test be performed?    Answer:   [facility]    Order Specific Question:   Does the patient have an implanted cardiac device?    Answer:   No    Order Specific Question:   Prescribed days of wear    Answer:   72    Order Specific Question:   Type of enrollment    Answer:   Home Enrollment    Order Specific Question:   Vendor:    Answer:   [employer]    Notify office for further evaluation and treatment, questions or concerns if any reported s/s fail to improve.   The patient was advised to call back or seek an in-person evaluation if any symptoms worsen or if the condition fails to improve as anticipated.   Further disposition pending results of labs. Discussed med's effects and SE's.    I discussed the assessment and treatment plan with the patient. The patient was provided an opportunity to ask questions and all were answered. The patient agreed with the plan and demonstrated an understanding of the instructions.  Discussed med's effects and SE's. Screening labs and tests as requested with regular follow-up as recommended.  I  provided 30 minutes of face-to-face time during this encounter including counseling, chart review, and critical decision making was preformed.   Future Appointments  Date Time Provider [place]  06/22/2022  9:00 AM Sara Jump, NP [facility] None     HPI  36 y.o. female  presents for a complete physical and follow up for has Graves disease; ADHD (attention deficit hyperactivity disorder); Anxiety; Vitamin B12 deficiency; Vitamin D deficiency; Insulin resistance; and Fatigue on their problem list.   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  She does have steady relationship with boyfriend, 3 years.  She has hx of abnormal pap smear with LEEP procedure. Has been on Nuvaring. Hx of irregular and very heavy menses. She is overdue PAP, last in 2018, pending with Dr. Janeann Brewer this year.   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 Has been recommended surgical intervention but postponing.   She has history of Graves, with other family history of autoimmune. She had autoimmune workup in 2019 including ANA, sed rate, RF, anti DNA, HLA B27 antibody, RNP antibody, SSB ([place]) antibody, and SSA ([place]) antibody were unremarkable.  Vitamin D and B12 levels were found to be low and she was advised to initiate supplementation. She reports medication compliance.  She is concerned for chronic fatigue that has worsened over the last few years.  Notes having severe mononucleosis in college, very draining, tired, SOB.  She feels symptoms are similar.  She recently had Covid August 2023.  States that after doing a simply activity like blow drying her hair, she will have to lay down, and then doing a load of laundry, she will have to lay down.   With pandemic was having more depression/anxiety; was much improved on effexor, but recently was able to taper off.  She is also prescribed PRN xanax taking 0-1 tabs/week. Has seen numerous counselors in the past without perceived benefit. 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   She is on adderall PRN for hx of ADD, takes on days that she works only, feels significant improvement with focus.    BMI is Body mass index is 25.46 kg/m., she tries to work on diet and exercise. Wt Readings from Last 3 Encounters:  01/09/22 153 lb (69.4 kg)  06/21/21 167 lb 9.6 oz (76 kg)  06/07/20 160 lb 12.8 oz (72.9 kg)   Today their BP is BP: 120/88 She does workout. She denies chest pain,  shortness of breath, dizziness.   The cholesterol last visit was:   Lab Results  Component Value Date   CHOL 163 01/28/2018   HDL 67 01/28/2018   LDLCALC 75 01/28/2018   TRIG 129 01/28/2018   CHOLHDL 2.4 01/28/2018   Last A1C in the office was:  Lab Results  Component Value Date   HGBA1C 4.9 06/21/2021   She has documented history of Graves disease, formerly followed by Dr. Forde Brewer, did monitoring only, never was treated and advised monitoring only unless TSHs are persistently low and trending Lab Results  Component Value Date   TSH 0.56 01/09/2022   Last GFR: Lab Results  Component Value Date   GFRNONAA 106 06/07/2020   Patient is on Vitamin D supplement, has been taking 50000 IU weekly.  Not currently at goal. Lab Results  Component Value Date   VD25OH 37 01/09/2022     She is  on monthly B12 shots;  Lab Results  Component Value Date   ZOXWRUEA54 098 06/21/2021      Current Medications:  Current Outpatient Medications on File Prior to Visit  Medication Sig Dispense Refill   ALPRAZolam (XANAX) 0.5 MG tablet Take 1/2-1 tab once or twice a day if needed only for panic attacks. Limit to <5 days per week to avoid tolerance. 60 tablet 0   etonogestrel-ethinyl estradiol (NUVARING) 0.12-0.015 MG/24HR vaginal ring Insert vaginally and leave in place for 3 consecutive weeks, then remove for 1 week. 3 each 1   FIBER COMPLETE PO Take by mouth.     meloxicam (MOBIC) 15 MG tablet Take 1 tablet (15 mg total) by mouth daily. 90 tablet 0   Multiple Vitamin (MULTIVITAMIN) tablet Take 1 tablet by mouth daily.     Probiotic Product (PROBIOTIC DAILY PO) Take 1 tablet by mouth daily.     No current facility-administered medications on file prior to visit.   Allergies:  Allergies  Allergen Reactions   Codeine Nausea And Vomiting   Milk-Related Compounds Hives and Nausea And Vomiting   Sulfonamide Derivatives Nausea And Vomiting   Penicillins Nausea And Vomiting    PER PATIENT just  nausea and vomiting   Medical History:  She has Graves disease; ADHD (attention deficit hyperactivity disorder); Anxiety; Vitamin B12 deficiency; Vitamin D deficiency; Insulin resistance; and Fatigue on their problem list. Health Maintenance:   Immunization History  Administered Date(s) Administered   HPV Quadrivalent 09/09/2001, 06/14/2005, 10/16/2005   Hepatitis B 04/03/1999   Meningococcal Polysaccharide 04/03/1999   Td 10/26/2004   Tdap 09/13/2014   Health Maintenance  Topic Date Due   COVID-19 Vaccine (1) Never done   PAP SMEAR-Modifier  07/12/2019   INFLUENZA VACCINE  10/31/2021   TETANUS/TDAP  09/12/2024   HPV VACCINES  Completed   HIV Screening  Completed   Hepatitis C Screening  Discontinued   Health Maintenance  Topic Date Due   COVID-19 Vaccine (1) Never done   PAP SMEAR-Modifier  07/12/2019   INFLUENZA VACCINE  10/31/2021   TETANUS/TDAP  09/12/2024   HPV VACCINES  Completed   HIV Screening  Completed   Hepatitis C Screening  Discontinued     Flu vaccine: declines HPV: 3/3 Covid 19: got 2/2, dates pending  LMP: No LMP recorded. Pap: 07/2016 neg HPV, DUE - will schedule with GYN Dr. Myrtis Ser: age 73  Colonoscopy: age 40  Last Dental Exam: Dr. Randel Pigg in [place], last 2023, q77m Last Eye Exam: [facility], last 2023, contacts Last Derm: Zoe Draelos in [place], last fall 2021.  Denies any recent changes to skin.  Patient Care Team: Unk Pinto, MD as PCP - General (Internal Medicine) Gatha Mayer, MD as Consulting Physician (Gastroenterology) Megan Salon, MD as Consulting Physician (Gynecology) Despina Hick, MD (Dermatology)  Surgical History:  She has a past surgical history that includes Wisdom tooth extraction; gum grafts; and laparoscopy (02/19/2011). Family History:  Herfamily history includes Bladder Cancer (age of onset: 9) in her paternal grandfather; Breast cancer (age of onset: 72) in her maternal grandmother; Cancer in her maternal  grandfather and paternal grandmother; Heart disease in her brother, maternal grandfather, maternal grandmother, paternal grandfather, and paternal grandmother; Heart murmur in her brother; Hyperlipidemia in her paternal grandfather; Hypertension in her paternal grandfather; Melanoma in her father, maternal uncle, and paternal grandfather; Pancreatic cancer in her maternal grandmother; Stroke (age of onset: 37) in her mother. Social History:  She reports that she has  never smoked. She has never used smokeless tobacco. She reports that she does not drink alcohol and does not use drugs.    Review of Systems:  Review of Systems  Constitutional:  Positive for malaise/fatigue (chronic fatigue). Negative for weight loss.  HENT:  Negative for hearing loss and tinnitus.   Eyes:  Negative for blurred vision and double vision.  Respiratory:  Negative for cough, sputum production, shortness of breath and wheezing.   Cardiovascular:  Negative for chest pain, palpitations, orthopnea, claudication, leg swelling and PND.  Gastrointestinal:  Positive for constipation (very irregular, probiotic helps, flying makes worse). Negative for abdominal pain, blood in stool, diarrhea, heartburn, melena, nausea and vomiting.  Genitourinary: Negative.   Musculoskeletal:  Negative for falls, joint pain and myalgias.  Skin:  Negative for rash.  Neurological:  Positive for headaches (sinus HA in airplane). Negative for dizziness, tingling, sensory change and weakness.  Endo/Heme/Allergies:  Positive for environmental allergies. Negative for polydipsia.  Psychiatric/Behavioral:  Negative for depression, memory loss, substance abuse and suicidal ideas. The patient is not nervous/anxious (improved) and does not have insomnia.   All other systems reviewed and are negative.   Physical Exam: Estimated body mass index is 25.46 kg/m as calculated from the following:   Height as of this encounter: $RemoveBeforeD'5\' 5"'DtbEsESrzWrPKN$  (1.651 m).   Weight as of  this encounter: 153 lb (69.4 kg). BP 120/88   Pulse 90   Temp 97.7 F (36.5 C)   Ht $R'5\' 5"'qU$  (1.651 m)   Wt 153 lb (69.4 kg)   SpO2 99%   BMI 25.46 kg/m  General Appearance: Well nourished, in no apparent distress.  Eyes: PERRLA, EOMs, conjunctiva no swelling or erythema Sinuses: No Frontal/maxillary tenderness  ENT/Mouth: Ext aud canals clear, normal light reflex with TMs without erythema, bulging. Good dentition. No erythema, swelling, or exudate on post pharynx. Tonsils not swollen or erythematous. Hearing normal.  Neck: Supple, thyroid normal. No bruits  Respiratory: Respiratory effort normal, BS equal bilaterally without rales, rhonchi, wheezing or stridor.  Cardio: RRR without murmurs, rubs or gallops. Brisk peripheral pulses without edema.  Chest: symmetric, with normal excursions and percussion.  Breasts: declines, will see GYN, doing self checks, no concerns Abdomen: Soft, nontender, no guarding, rebound, hernias, masses, or organomegaly.  Lymphatics: Non tender without lymphadenopathy.  Genitourinary: defer to GYN Musculoskeletal: Full ROM all peripheral extremities,5/5 strength, and normal gait.  Skin: Warm, dry without rashes, lesions, ecchymosis. Neuro: Cranial nerves intact, reflexes equal bilaterally. Normal muscle tone, no cerebellar symptoms. Sensation intact.  Psych: Awake and oriented X 3, normal affect, Insight and Judgment appropriate.   EKG: WNL no ST changes reviewed from 2019, defer this year   Sara Corsi, NP-C 1:08 PM [facility]

## 2022-01-10 ENCOUNTER — Ambulatory Visit: Payer: Managed Care, Other (non HMO) | Attending: Nurse Practitioner

## 2022-01-10 ENCOUNTER — Other Ambulatory Visit: Payer: Self-pay | Admitting: Nurse Practitioner

## 2022-01-10 DIAGNOSIS — F909 Attention-deficit hyperactivity disorder, unspecified type: Secondary | ICD-10-CM

## 2022-01-10 DIAGNOSIS — R002 Palpitations: Secondary | ICD-10-CM

## 2022-01-10 DIAGNOSIS — F988 Other specified behavioral and emotional disorders with onset usually occurring in childhood and adolescence: Secondary | ICD-10-CM

## 2022-01-10 LAB — CBC WITH DIFFERENTIAL/PLATELET
Absolute Monocytes: 403 cells/uL (ref 200–950)
Basophils Absolute: 48 cells/uL (ref 0–200)
Basophils Relative: 0.5 %
Eosinophils Absolute: 29 cells/uL (ref 15–500)
Eosinophils Relative: 0.3 %
HCT: 40.8 % (ref 35.0–45.0)
Hemoglobin: 13.5 g/dL (ref 11.7–15.5)
Lymphs Abs: 2150 cells/uL (ref 850–3900)
MCH: 30 pg (ref 27.0–33.0)
MCHC: 33.1 g/dL (ref 32.0–36.0)
MCV: 90.7 fL (ref 80.0–100.0)
MPV: 11.8 fL (ref 7.5–12.5)
Monocytes Relative: 4.2 %
Neutro Abs: 6970 cells/uL (ref 1500–7800)
Neutrophils Relative %: 72.6 %
Platelets: 357 10*3/uL (ref 140–400)
RBC: 4.5 10*6/uL (ref 3.80–5.10)
RDW: 12.1 % (ref 11.0–15.0)
Total Lymphocyte: 22.4 %
WBC: 9.6 10*3/uL (ref 3.8–10.8)

## 2022-01-10 LAB — COMPLETE METABOLIC PANEL WITH GFR
AG Ratio: 1.6 (calc) (ref 1.0–2.5)
ALT: 13 U/L (ref 6–29)
AST: 14 U/L (ref 10–30)
Albumin: 4.7 g/dL (ref 3.6–5.1)
Alkaline phosphatase (APISO): 83 U/L (ref 31–125)
BUN: 16 mg/dL (ref 7–25)
CO2: 25 mmol/L (ref 20–32)
Calcium: 10.3 mg/dL — ABNORMAL HIGH (ref 8.6–10.2)
Chloride: 106 mmol/L (ref 98–110)
Creat: 0.8 mg/dL (ref 0.50–0.97)
Globulin: 3 g/dL (calc) (ref 1.9–3.7)
Glucose, Bld: 86 mg/dL (ref 65–99)
Potassium: 5 mmol/L (ref 3.5–5.3)
Sodium: 143 mmol/L (ref 135–146)
Total Bilirubin: 0.3 mg/dL (ref 0.2–1.2)
Total Protein: 7.7 g/dL (ref 6.1–8.1)
eGFR: 98 mL/min/{1.73_m2} (ref >=60)

## 2022-01-10 LAB — EPSTEIN-BARR VIRUS VCA ANTIBODY PANEL
EBV NA IgG: 425 U/mL — ABNORMAL HIGH
EBV VCA IgG: 203 U/mL — ABNORMAL HIGH
EBV VCA IgM: <36 U/mL

## 2022-01-10 LAB — VITAMIN D 25 HYDROXY (VIT D DEFICIENCY, FRACTURES): Vit D, 25-Hydroxy: 37 ng/mL (ref 30–100)

## 2022-01-10 LAB — TSH: TSH: 0.56 mIU/L

## 2022-01-10 NOTE — Progress Notes (Unsigned)
Enrolled for [employer] to mail a [employer] XT long term holter monitor to the patients address on file.   DOD to read. 

## 2022-01-11 ENCOUNTER — Other Ambulatory Visit: Payer: Self-pay | Admitting: Nurse Practitioner

## 2022-01-11 ENCOUNTER — Telehealth: Payer: Self-pay | Admitting: Nurse Practitioner

## 2022-01-11 MED ORDER — AMPHETAMINE-DEXTROAMPHETAMINE 30 MG PO TABS: ORAL_TABLET | ORAL | 0 refills | Status: DC

## 2022-01-11 MED ORDER — VITAMIN D3 1.25 MG (50000 UT) PO CAPS: 1.0000 | ORAL_CAPSULE | ORAL | 3 refills | Status: DC

## 2022-01-11 MED ORDER — CYANOCOBALAMIN 1000 MCG/ML IJ SOLN: INTRAMUSCULAR | 3 refills | Status: DC

## 2022-01-11 NOTE — Telephone Encounter (Signed)
Pt is needing refills that havent been called it yet: Adderall, VitaminD and Vitamn b12 shots to [facility] [address]. Will be leaving out of town tomorrow so needing this today

## 2022-01-22 DIAGNOSIS — R002 Palpitations: Secondary | ICD-10-CM | POA: Diagnosis not present

## 2022-02-05 ENCOUNTER — Other Ambulatory Visit: Payer: Self-pay | Admitting: Nurse Practitioner

## 2022-02-05 DIAGNOSIS — F988 Other specified behavioral and emotional disorders with onset usually occurring in childhood and adolescence: Secondary | ICD-10-CM

## 2022-02-05 DIAGNOSIS — F909 Attention-deficit hyperactivity disorder, unspecified type: Secondary | ICD-10-CM

## 2022-02-05 MED ORDER — AMPHETAMINE-DEXTROAMPHETAMINE 30 MG PO TABS: ORAL_TABLET | ORAL | 0 refills | Status: DC

## 2022-02-15 ENCOUNTER — Other Ambulatory Visit: Payer: Self-pay | Admitting: Nurse Practitioner

## 2022-02-15 DIAGNOSIS — I471 Supraventricular tachycardia, unspecified: Secondary | ICD-10-CM

## 2022-02-15 DIAGNOSIS — R002 Palpitations: Secondary | ICD-10-CM

## 2022-02-20 ENCOUNTER — Encounter: Payer: Self-pay | Admitting: Internal Medicine

## 2022-02-20 ENCOUNTER — Ambulatory Visit: Payer: Managed Care, Other (non HMO) | Attending: Internal Medicine | Admitting: Internal Medicine

## 2022-02-20 VITALS — BP 128/80 | HR 94 | Ht 65.0 in | Wt 156.2 lb

## 2022-02-20 DIAGNOSIS — R002 Palpitations: Secondary | ICD-10-CM | POA: Diagnosis not present

## 2022-02-20 MED ORDER — METOPROLOL TARTRATE 25 MG PO TABS: ORAL_TABLET | ORAL | 3 refills | Status: DC

## 2022-02-20 NOTE — Patient Instructions (Addendum)
Medication Instructions:  Start Metoprolol Tartrate 25 mg take 1/2 tablet ( 12.5 mg ) twice a day Continue all other medications *If you need a refill on your cardiac medications before your next appointment, please call your pharmacy*   Lab Work: None ordered   Testing/Procedures: Echo   Follow-Up: At [facility], you and your health needs are our priority.  As part of our continuing mission to provide you with exceptional heart care, we have created designated Provider Care Teams.  These Care Teams include your primary Cardiologist (physician) and Advanced Practice Providers (APPs -  Physician Assistants and Nurse Practitioners) who all work together to provide you with the care you need, when you need it.  We recommend signing up for the patient portal called "MyChart".  Sign up information is provided on this After Visit Summary.  MyChart is used to connect with patients for Virtual Visits (Telemedicine).  Patients are able to view lab/test results, encounter notes, upcoming appointments, etc.  Non-urgent messages can be sent to your provider as well.   To learn more about what you can do with MyChart, go to [email or web address].    Your next appointment:  6 months    Call in Feb to schedule May appointment    The format for your next appointment: Office   Provider:  Dr.Branch   Important Information About Sugar

## 2022-02-20 NOTE — Progress Notes (Signed)
Cardiology Office Note:    Date:  02/20/2022   ID:  Sara Brewer, DOB 07-26-1985, MRN [id number]  PCP:  Lucky Cowboy, MD   [facility] Providers Cardiologist:  None     Referring MD: Lucky Cowboy, MD   No chief complaint on file. SVT  History of Present Illness:    Sara Brewer is a 36 y.o. female with a hx of GERD, anxiety anemia, iron deficiency anemia, prior decreased TSH/normalized. She had a prior cardiac monitor that showed NSR. Rare ectopy. Episode of SVT noted up to 150 bpm. 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 She's on adderrall. She gets SOB with it. Notes brother has a congential defect  Past Medical History:  Diagnosis Date   ADD (attention deficit disorder with hyperactivity)    Anemia    iron   Anxiety    Back pain    GERD (gastroesophageal reflux disease)    Graves disease 2014   Dr. Evlyn Kanner    Nausea and vomiting after administration of anesthetic agent    Pelvic pain in female    Vitamin B12 deficiency    Vitamin D deficiency     Past Surgical History:  Procedure Laterality Date   gum grafts     LAPAROSCOPY  02/19/2011   Procedure: LAPAROSCOPY DIAGNOSTIC;  Surgeon: Lum Keas;  Location: [facility];  Service: Gynecology;  Laterality: N/A;  Diagnostic Laparoscopy   WISDOM TOOTH EXTRACTION      Current Medications: Current Meds  Medication Sig   ALPRAZolam (XANAX) 0.5 MG tablet Take 1/2-1 tab once or twice a day if needed only for panic attacks. Limit to <5 days per week to avoid tolerance.   amphetamine-dextroamphetamine (ADDERALL) 30 MG tablet Take 1 tablet daily as needed for Focus & Concentration; try to limit to <5 /week due to addictive potential. Should last longer than 30 days.   Cholecalciferol (VITAMIN D3) 1.25 MG (50000 UT) CAPS Take 1 tablet by mouth once a week.   cyanocobalamin (VITAMIN B12) 1000 MCG/ML injection INJECT 1 ML INTO THE SKIN EVERY 30 DAYS   etonogestrel-ethinyl estradiol (NUVARING) 0.12-0.015 MG/24HR  vaginal ring Insert vaginally and leave in place for 3 consecutive weeks, then remove for 1 week.   meloxicam (MOBIC) 15 MG tablet Take 1 tablet (15 mg total) by mouth daily.   metoprolol tartrate (LOPRESSOR) 25 MG tablet Take 1/2 tablet ( 12.5 mg ) twice a day   Multiple Vitamin (MULTIVITAMIN) tablet Take 1 tablet by mouth daily.   Probiotic Product (PROBIOTIC DAILY PO) Take 1 tablet by mouth daily.     Allergies:   Codeine, Milk-related compounds, Sulfonamide derivatives, and Penicillins   Social History   Socioeconomic History   Marital status: Significant Other    Spouse name: Not on file   Number of children: Not on file   Years of education: Not on file   Highest education level: Not on file  Occupational History   Not on file  Tobacco Use   Smoking status: Never   Smokeless tobacco: Never  Vaping Use   Vaping Use: Never used  Substance and Sexual Activity   Alcohol use: No   Drug use: No   Sexual activity: Yes    Partners: Male    Birth control/protection: Other-see comments    Comment: nuvaring  Other Topics Concern   Not on file  Social History Narrative   Not on file   Social Determinants of Health   Financial Resource Strain: Not on file  Food Insecurity: Not on file  Transportation Needs: Not on file  Physical Activity: Insufficiently Active (02/25/2019)   Exercise Vital Sign    Days of Exercise per Week: 3 days    Minutes of Exercise per Session: 40 min  Stress: Stress Concern Present (02/25/2019)   [employer] of Occupational Health - Occupational Stress Questionnaire    Feeling of Stress : To some extent  Social Connections: Not on file     Family History: The patient's family history includes Bladder Cancer (age of onset: 79) in her paternal grandfather; Breast cancer (age of onset: 77) in her maternal grandmother; Cancer in her maternal grandfather and paternal grandmother; Heart disease in her brother, maternal grandfather, maternal  grandmother, paternal grandfather, and paternal grandmother; Heart murmur in her brother; Hyperlipidemia in her paternal grandfather; Hypertension in her paternal grandfather; Melanoma in her father, maternal uncle, and paternal grandfather; Pancreatic cancer in her maternal grandmother; Stroke (age of onset: 69) in her mother.   ROS:   Please see the history of present illness.     All other systems reviewed and are negative.  EKGs/Labs/Other Studies Reviewed:    The following studies were reviewed today:   EKG:  EKG is  ordered today.  The ekg ordered today demonstrates   02/20/2022- NSR  Recent Labs: 06/21/2021: Magnesium 2.1 01/09/2022: ALT 13; BUN 16; Creat 0.80; Hemoglobin 13.5; Platelets 357; Potassium 5.0; Sodium 143; TSH 0.56   Recent Lipid Panel    Component Value Date/Time   CHOL 163 01/28/2018 1059   TRIG 129 01/28/2018 1059   HDL 67 01/28/2018 1059   CHOLHDL 2.4 01/28/2018 1059   VLDL 10 08/23/2015 1544   LDLCALC 75 01/28/2018 1059     Risk Assessment/Calculations:     Physical Exam:    VS:  Vitals:   02/20/22 1037  BP: 128/80  Pulse: 94  SpO2: 100%     Wt Readings from Last 3 Encounters:  02/20/22 156 lb 3.2 oz (70.9 kg)  01/09/22 153 lb (69.4 kg)  06/21/21 167 lb 9.6 oz (76 kg)     GEN:  Well nourished, well developed in no acute distress HEENT: Normal NECK: No JVD; No carotid bruits LYMPHATICS: No lymphadenopathy CARDIAC: RRR, no murmurs, rubs, gallops RESPIRATORY:  Clear to auscultation without rales, wheezing or rhonchi  ABDOMEN: Soft, non-tender, non-distended MUSCULOSKELETAL:  No edema; No deformity  SKIN: Warm and dry NEUROLOGIC:  Alert and oriented x 3 PSYCHIATRIC:  Normal affect   ASSESSMENT:    Palpitations: had episodes of SVT. She's on adderrall. Can start a low dose BB. Recommended hydration with electrolytes. Noted family hx of potential congenital heart disease in her brother, will get a TTE.  PLAN:    In order of  problems listed above:  Metoprolol tartrate 12.5 mg BID TTE Follow up 6 months         Medication Adjustments/Labs and Tests Ordered: Current medicines are reviewed at length with the patient today.  Concerns regarding medicines are outlined above.  Orders Placed This Encounter  Procedures   EKG 12-Lead   ECHOCARDIOGRAM COMPLETE   Meds ordered this encounter  Medications   metoprolol tartrate (LOPRESSOR) 25 MG tablet    Sig: Take 1/2 tablet ( 12.5 mg ) twice a day    Dispense:  90 tablet    Refill:  3    Patient Instructions  Medication Instructions:  Start Metoprolol Tartrate 25 mg take 1/2 tablet ( 12.5 mg ) twice a day Continue all other  medications *If you need a refill on your cardiac medications before your next appointment, please call your pharmacy*   Lab Work: None ordered   Testing/Procedures: Echo   Follow-Up: At [facility], you and your health needs are our priority.  As part of our continuing mission to provide you with exceptional heart care, we have created designated Provider Care Teams.  These Care Teams include your primary Cardiologist (physician) and Advanced Practice Providers (APPs -  Physician Assistants and Nurse Practitioners) who all work together to provide you with the care you need, when you need it.  We recommend signing up for the patient portal called "MyChart".  Sign up information is provided on this After Visit Summary.  MyChart is used to connect with patients for Virtual Visits (Telemedicine).  Patients are able to view lab/test results, encounter notes, upcoming appointments, etc.  Non-urgent messages can be sent to your provider as well.   To learn more about what you can do with MyChart, go to [email or web address].    Your next appointment:  6 months    Call in Feb to schedule May appointment    The format for your next appointment: Office   Provider:  Dr.Matthews Franks   Important Information About Sugar          Signed, Maisie Fus, MD  02/20/2022 3:40 PM    [facility]

## 2022-03-05 ENCOUNTER — Other Ambulatory Visit: Payer: Self-pay

## 2022-03-05 MED ORDER — MELOXICAM 15 MG PO TABS: 15.0000 mg | ORAL_TABLET | Freq: Every day | ORAL | 0 refills | Status: DC

## 2022-03-16 ENCOUNTER — Ambulatory Visit (HOSPITAL_COMMUNITY): Payer: Managed Care, Other (non HMO) | Attending: Internal Medicine

## 2022-03-16 DIAGNOSIS — R0609 Other forms of dyspnea: Secondary | ICD-10-CM

## 2022-03-16 DIAGNOSIS — R002 Palpitations: Secondary | ICD-10-CM | POA: Diagnosis present

## 2022-03-16 DIAGNOSIS — I080 Rheumatic disorders of both mitral and aortic valves: Secondary | ICD-10-CM | POA: Diagnosis not present

## 2022-03-16 LAB — ECHOCARDIOGRAM COMPLETE
Area-P 1/2: 4.29 cm2
S' Lateral: 3 cm

## 2022-03-19 ENCOUNTER — Other Ambulatory Visit: Payer: Self-pay | Admitting: Nurse Practitioner

## 2022-03-19 DIAGNOSIS — F909 Attention-deficit hyperactivity disorder, unspecified type: Secondary | ICD-10-CM

## 2022-03-19 DIAGNOSIS — F988 Other specified behavioral and emotional disorders with onset usually occurring in childhood and adolescence: Secondary | ICD-10-CM

## 2022-03-19 MED ORDER — AMPHETAMINE-DEXTROAMPHETAMINE 30 MG PO TABS: ORAL_TABLET | ORAL | 0 refills | Status: DC

## 2022-03-23 ENCOUNTER — Encounter: Payer: Self-pay | Admitting: Nurse Practitioner

## 2022-03-23 DIAGNOSIS — F988 Other specified behavioral and emotional disorders with onset usually occurring in childhood and adolescence: Secondary | ICD-10-CM

## 2022-03-23 DIAGNOSIS — F909 Attention-deficit hyperactivity disorder, unspecified type: Secondary | ICD-10-CM

## 2022-03-24 MED ORDER — AMPHETAMINE-DEXTROAMPHETAMINE 30 MG PO TABS: ORAL_TABLET | ORAL | 0 refills | Status: DC

## 2022-05-02 ENCOUNTER — Other Ambulatory Visit: Payer: Self-pay | Admitting: Nurse Practitioner

## 2022-05-02 DIAGNOSIS — F988 Other specified behavioral and emotional disorders with onset usually occurring in childhood and adolescence: Secondary | ICD-10-CM

## 2022-05-02 DIAGNOSIS — F909 Attention-deficit hyperactivity disorder, unspecified type: Secondary | ICD-10-CM

## 2022-05-02 MED ORDER — MELOXICAM 15 MG PO TABS: 15.0000 mg | ORAL_TABLET | Freq: Every day | ORAL | 0 refills | Status: DC

## 2022-05-02 MED ORDER — AMPHETAMINE-DEXTROAMPHETAMINE 30 MG PO TABS: ORAL_TABLET | ORAL | 0 refills | Status: DC

## 2022-05-04 ENCOUNTER — Other Ambulatory Visit: Payer: Self-pay | Admitting: Nurse Practitioner

## 2022-05-04 DIAGNOSIS — F988 Other specified behavioral and emotional disorders with onset usually occurring in childhood and adolescence: Secondary | ICD-10-CM

## 2022-05-04 DIAGNOSIS — F909 Attention-deficit hyperactivity disorder, unspecified type: Secondary | ICD-10-CM

## 2022-05-04 MED ORDER — AMPHETAMINE-DEXTROAMPHETAMINE 30 MG PO TABS: ORAL_TABLET | ORAL | 0 refills | Status: DC

## 2022-05-04 NOTE — Telephone Encounter (Signed)
[facility]all is on backorder at [facility], requesting it be sent to [facility] [phone]

## 2022-05-11 ENCOUNTER — Other Ambulatory Visit: Payer: Self-pay | Admitting: Nurse Practitioner

## 2022-05-11 DIAGNOSIS — F909 Attention-deficit hyperactivity disorder, unspecified type: Secondary | ICD-10-CM

## 2022-05-11 DIAGNOSIS — F988 Other specified behavioral and emotional disorders with onset usually occurring in childhood and adolescence: Secondary | ICD-10-CM

## 2022-05-28 ENCOUNTER — Ambulatory Visit
Admission: EM | Admit: 2022-05-28 | Discharge: 2022-05-28 | Disposition: A | Discharge status: Home / Self Care | Payer: 59 | Attending: Nurse Practitioner | Admitting: Nurse Practitioner

## 2022-05-28 DIAGNOSIS — L509 Urticaria, unspecified: Secondary | ICD-10-CM

## 2022-05-28 MED ORDER — METHYLPREDNISOLONE 4 MG PO TBPK: ORAL_TABLET | ORAL | 0 refills | Status: AC

## 2022-05-28 NOTE — ED Triage Notes (Signed)
Pt c/o hives onset ~ thurs says "it'll be in one spot and then go somewhere else." Says they are itchy and pt is now starting to feel in her throat.

## 2022-05-28 NOTE — ED Provider Notes (Signed)
[facility]    CSN: [id number] Arrival date & time: 05/28/22  1413      History   Chief Complaint Chief Complaint  Patient presents with   Urticaria    HPI Stacy Mueller is a 37 y.o. female.   HPI  She is in today for an evaluation of hives. She reports that she noticed this on Thursday. She denies any new stressors, anxiety or changes in foods, soaps, detergents or clothing. Denies fever, chills, headache, dizziness, visual changes, shortness of breath, dyspnea on exertion, chest pain, nausea, vomiting. Past Medical History:  Diagnosis Date   ADD (attention deficit disorder)    Anxiety    GERD (gastroesophageal reflux disease)    Graves disease    Vaginal Pap smear, abnormal    Vitamin B 12 deficiency    Vitamin D deficiency     Patient Active Problem List   Diagnosis Date Noted   Postpartum care following cesarean delivery (10/25) 01/26/2020   S/P cesarean section: complete placenta previa 01/26/2020   Gestational thrombocytopenia ([place]) 01/26/2020   Placenta previa 01/25/2020   ADD (attention deficit disorder)    Anxiety    Vitamin B 12 deficiency    Vitamin D deficiency    Graves disease    GERD (gastroesophageal reflux disease)     Past Surgical History:  Procedure Laterality Date   CESAREAN SECTION N/A 01/25/2020   Procedure: Primary CESAREAN SECTION;  Surgeon: Brien Few, MD;  Location: [facility]S;  Service: Obstetrics;  Laterality: N/A;   LEEP  2011    OB History     Gravida  2   Para  2   Term  2   Preterm      AB      Living  2      SAB      IAB      Ectopic      Multiple  0   Live Births  2            Home Medications    Prior to Admission medications   Medication Sig Start Date End Date Taking? Authorizing Provider  methylPREDNISolone (MEDROL) 4 MG TBPK tablet Follow package instructions. 05/28/22 06/03/22 Yes Vevelyn Francois, NP  coconut oil OIL Apply 1 application topically as needed.  01/27/20   Juliene Pina, CNM  ibuprofen (ADVIL) 800 MG tablet Take 1 tablet (800 mg total) by mouth every 6 (six) hours. 01/27/20   Juliene Pina, CNM    Family History Family History  Problem Relation Age of Onset   Hypertension Mother    Hypertension Father    Hypertension Maternal Grandmother    Hypertension Maternal Grandfather    Hypertension Paternal Grandmother    Hypertension Paternal Grandfather     Social History Social History   Tobacco Use   Smoking status: Never   Smokeless tobacco: Never  Vaping Use   Vaping Use: Never used  Substance Use Topics   Alcohol use: No    Comment: Rare   Drug use: No     Allergies   Patient has no known allergies.   Review of Systems Review of Systems   Physical Exam Triage Vital Signs ED Triage Vitals [05/28/22 1546]  Enc Vitals Group     BP (!) 147/81     Pulse Rate 85     Resp 18     Temp 98 F (36.7 C)     Temp Source Oral  SpO2 99 %     Weight      Height      Head Circumference      Peak Flow      Pain Score 0     Pain Loc      Pain Edu?      Excl. in [place]?    No data found.  Updated Vital Signs BP (!) 147/81 (BP Location: Left Arm)   Pulse 85   Temp 98 F (36.7 C) (Oral)   Resp 18   SpO2 99%   Breastfeeding No   Visual Acuity Right Eye Distance:   Left Eye Distance:   Bilateral Distance:    Right Eye Near:   Left Eye Near:    Bilateral Near:     Physical Exam Constitutional:      Appearance: She is normal weight.  HENT:     Head: Normocephalic and atraumatic.     Nose: Nose normal.  Cardiovascular:     Rate and Rhythm: Normal rate.     Pulses: Normal pulses.  Pulmonary:     Effort: Pulmonary effort is normal.  Skin:    General: Skin is warm.     Capillary Refill: Capillary refill takes less than 2 seconds.     Findings: Erythema present.          Comments: Hives  Neurological:     General: No focal deficit present.     Mental Status: She is alert and oriented to  person, place, and time.      UC Treatments / Results  Labs (all labs ordered are listed, but only abnormal results are displayed) Labs Reviewed - No data to display  EKG   Radiology No results found.  Procedures Procedures (including critical care time)  Medications Ordered in UC Medications - No data to display  Initial Impression / Assessment and Plan / UC Course  I have reviewed the triage vital signs and the nursing notes.  Pertinent labs & imaging results that were available during my care of the patient were reviewed by me and considered in my medical decision making (see chart for details).     Hives Final Clinical Impressions(s) / UC Diagnoses   Final diagnoses:  Urticaria     Discharge Instructions      You have been diagnosed with Urticaria. This is probably allergy related. You have been prescribed a Medrol Dosepak to take as directed.  Continue to use Benadryl at night for itch.      ED Prescriptions     Medication Sig Dispense Auth. Provider   methylPREDNISolone (MEDROL) 4 MG TBPK tablet Follow package instructions. 21 tablet Vevelyn Francois, NP      PDMP not reviewed this encounter.   Dionisio David [place], [place] 05/28/22 [phone]

## 2022-05-28 NOTE — Discharge Instructions (Addendum)
You have been diagnosed with Urticaria. This is probably allergy related. You have been prescribed a Medrol Dosepak to take as directed.  Continue to use Benadryl at night for itch.

## 2022-06-08 ENCOUNTER — Other Ambulatory Visit: Payer: Self-pay | Admitting: Nurse Practitioner

## 2022-06-08 DIAGNOSIS — F988 Other specified behavioral and emotional disorders with onset usually occurring in childhood and adolescence: Secondary | ICD-10-CM

## 2022-06-08 DIAGNOSIS — F909 Attention-deficit hyperactivity disorder, unspecified type: Secondary | ICD-10-CM

## 2022-06-08 MED ORDER — AMPHETAMINE-DEXTROAMPHETAMINE 30 MG PO TABS: ORAL_TABLET | ORAL | 0 refills | Status: DC

## 2022-06-22 ENCOUNTER — Encounter: Payer: Managed Care, Other (non HMO) | Admitting: Nurse Practitioner

## 2022-07-02 ENCOUNTER — Encounter: Payer: Managed Care, Other (non HMO) | Admitting: Nurse Practitioner

## 2022-07-05 NOTE — Progress Notes (Signed)
Complete Physical  Assessment and Plan:  Sara Brewer was seen today for annual exam.  Diagnoses and all orders for this visit:  Encounter for Annual Physical Exam with abnormal findings Due annually  Health Maintenance reviewed Healthy lifestyle reviewed and goals set   Graves disease Est with Dr. Evlyn Kanner, monitor TSH, refer back if trending down -     TSH  Vitamin B12 deficiency Continue supplement  -     Vitamin B12  Vitamin D deficiency .Continue supplement  -     Vitamin D (25 hydroxy)  Anxiety state Improved, off of effexor; rare xanax use per PDMP review Stress management techniques discussed, increase water, good sleep hygiene discussed, increase exercise, and increase veggies.  -     ALPRAZolam (XANAX) 0.5 MG tablet; Take 1/2-1 tab once or twice a day if needed only for panic attacks. Limit to <5 days per week to avoid tolerance.  Contraceptive management Script to cover until can get in with GYN -     etonogestrel-ethinyl estradiol (NUVARING) 0.12-0.015 MG/24HR vaginal ring; Insert vaginally and leave in place for 3 consecutive weeks, then remove for 1 week.  Attention deficit disorder, unspecified hyperactivity presence Med helps with focus, no AE's. The patient was counseled on the addictive nature of the medication and was encouraged to take drug holidays when not needed. Monitor via PDMP.  -     amphetamine-dextroamphetamine (ADDERALL) 30 MG tablet; Take 1 tablet by mouth daily.   Overweight BMI 27 Weight trending up despite fairly good lifestyle habits today Check insulin for resistance, B12, TSH - Grave's might explain, if abnormal plan Dr. Evlyn Kanner follow up Denies difficulty controlling appetite Has tried caloric intake, appropriate  Consider repeat food log Long discussion about weight loss, diet, and exercise Recommended diet heavy in fruits and veggies and low in animal meats, cheeses, and dairy products, appropriate calorie intake Discussed appropriate  weight for height Follow up at next visit -     A1C  Screening for diabetes mellitus -     Hemoglobin A1c  Screening for hematuria or proteinuria -     Urinalysis, Routine w reflex microscopic -     Microalbumin / creatinine urine ratio  Overweight Long discussion about weight loss, diet, and exercise Recommended diet heavy in fruits and veggies and low in animal meats, cheeses, and dairy products, appropriate calorie intake Patient will work on decreasing saturated fats and simple carbs. Increase lean protein and exercise Follow up at next visit   Medication management -     CBC with Differential/Platelet -     COMPLETE METABOLIC PANEL WITH GFR -     Magnesium -     Lipid panel -     TSH -     Hemoglobin A1c -     VITAMIN D 25 Hydroxy (Vit-D Deficiency, Fractures) -     Vitamin B12 -     Urinalysis, Routine w reflex microscopic -     Microalbumin / creatinine urine ratio  Screening cholesterol level -     Lipid panel  Tachycardia Increase Metoprolol to 25 mg BID, monitor BP and pulse.  If BP < 100/60 or pulse < 60 notify the office -     metoprolol tartrate (LOPRESSOR) 25 MG tablet; Take 1 tablet ( 12.5 mg ) twice a day  Arthritis Was to have surgery on left foot but has put off Use Meloxicam PRN once a day for pain -     meloxicam (MOBIC) 15 MG tablet; Take  1 tablet (15 mg total) by mouth daily.  Screening for cervical cancer -     PAP, TP IMAGING, WNL RFLX HPV     Discussed med's effects and SE's. Screening labs and tests as requested with regular follow-up as recommended. Over 40 minutes of exam, counseling, chart review, and complex, high level critical decision making was performed this visit.   Future Appointments  Date Time Provider Department Center  09/10/2022  9:00 AM Maisie FusBranch, Mary E, MD [facility] None  07/08/2023 10:00 AM Raynelle DickWilkinson, Chanell Nadeau E, NP [facility] None     HPI  37 y.o. female  presents for a complete physical and follow up for has Graves  disease; ADHD (attention deficit hyperactivity disorder); Anxiety; Vitamin B12 deficiency; Vitamin D deficiency; Insulin resistance; and Fatigue on their problem list.   She works as a Financial controllerflight attendant for a [employer] and is losing her team out of nowhere- [employer] is folding.  She does have steady relationship with boyfriend, 3 years. Does request STD testing.   She has hx of abnormal pap smear with LEEP procedure. Has been on Nuvaring. Hx of irregular and very heavy menses. 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 Has been recommended surgical intervention but postponing.   She has history of Graves, with other family history of autoimmune. She had autoimmune workup in 2019 including ANA, sed rate, RF, anti DNA, HLA B27 antibody, RNP antibody, SSB ([place]) antibody, and SSA ([place]) antibody were unremarkable.  Vitamin D and B12 levels were found to be low and she was advised to initiate supplementation.   With pandemic was having more depression/anxiety; was much improved on effexor, but recently was able to taper off.  She is also prescribed PRN xanax taking 0-1 tabs/week. Has seen numerous counselors in the past without perceived benefit  She is on adderall PRN for hx of ADD, takes on days that she works only, feels significant improvement with focus.    BMI is Body mass index is 27.16 kg/m., she admits diet hasn't been as good, trying to make better choices when able. Wt Readings from Last 3 Encounters:  07/06/22 163 lb 3.2 oz (74 kg)  02/20/22 156 lb 3.2 oz (70.9 kg)  01/09/22 153 lb (69.4 kg)   Today their BP is BP: 128/82 . She is currently on Metoprolol 25 mg 1/2 tab daily. Continues to have some shortness of breath which has been evaluated by cardiology.  Will still get  occasional runs of elevated pulse.  BP Readings from Last 3 Encounters:  07/06/22 128/82  02/20/22 128/80  01/09/22 120/88  She does workout. She denies chest pain, shortness of breath, dizziness.   Pulse Readings from Last 3 Encounters:  07/06/22 80  02/20/22 94  01/09/22 90    The cholesterol last visit was:   Lab Results  Component Value Date   CHOL 163 01/28/2018   HDL 67 01/28/2018   LDLCALC 75 01/28/2018   TRIG 129 01/28/2018   CHOLHDL 2.4 01/28/2018   Last Z6XA1C in the office was:  Lab Results  Component Value Date   HGBA1C 4.9 06/21/2021   She has documented history of Graves disease, [place], did monitoring only, never was treated and advised monitoring only unless  TSHs are persistently low and trending Lab Results  Component Value Date   TSH 0.56 01/09/2022   Last GFR: Lab Results  Component Value Date   GFRNONAA 106 06/07/2020   Patient is on Vitamin D supplement, has been taking 1610950000 IU weekly Lab Results  Component Value Date   VD25OH 37 01/09/2022     She is on monthly B12 shots;  Lab Results  Component Value Date   VITAMINB12 513 06/21/2021      Current Medications:  Current Outpatient Medications on File Prior to Visit  Medication Sig Dispense Refill   ALPRAZolam (XANAX) 0.5 MG tablet Take 1/2-1 tab once or twice a day if needed only for panic attacks. Limit to <5 days per week to avoid tolerance. 60 tablet 0   amphetamine-dextroamphetamine (ADDERALL) 30 MG tablet Take 1 tablet daily as needed for Focus & Concentration; try to limit to <5 /week due to addictive potential. Should last longer than 30 days. 30 tablet 0   Cholecalciferol (VITAMIN D3) 1.25 MG (50000 UT) CAPS Take 1 tablet by mouth once a week. 12 capsule 3   cyanocobalamin (VITAMIN B12) 1000 MCG/ML injection INJECT 1 ML INTO THE SKIN EVERY 30 DAYS 3 mL 3   etonogestrel-ethinyl estradiol (NUVARING) 0.12-0.015 MG/24HR vaginal ring Insert vaginally and leave in  place for 3 consecutive weeks, then remove for 1 week. 3 each 1   meloxicam (MOBIC) 15 MG tablet Take 1 tablet (15 mg total) by mouth daily. 90 tablet 0   metoprolol tartrate (LOPRESSOR) 25 MG tablet Take 1/2 tablet ( 12.5 mg ) twice a day 90 tablet 3   Multiple Vitamin (MULTIVITAMIN) tablet Take 1 tablet by mouth daily.     Probiotic Product (PROBIOTIC DAILY PO) Take 1 tablet by mouth daily.     FIBER COMPLETE PO Take by mouth. (Patient not taking: Reported on 02/20/2022)     No current facility-administered medications on file prior to visit.   Allergies:  Allergies  Allergen Reactions   Codeine Nausea And Vomiting   Milk-Related Compounds Hives and Nausea And Vomiting   Sulfonamide Derivatives Nausea And Vomiting   Penicillins Nausea And Vomiting    PER PATIENT just nausea and vomiting   Medical History:  She has Graves disease; ADHD (attention deficit hyperactivity disorder); Anxiety; Vitamin B12 deficiency; Vitamin D deficiency; Insulin resistance; and Fatigue on their problem list. Health Maintenance:   Immunization History  Administered Date(s) Administered   HPV Quadrivalent 09/09/2001, 06/14/2005, 10/16/2005   Hepatitis B 04/03/1999   Meningococcal polysaccharide vaccine (MPSV4) 04/03/1999   Td 10/26/2004   Tdap 09/13/2014   Health Maintenance  Topic Date Due   COVID-19 Vaccine (1) Never done   PAP SMEAR-Modifier  07/12/2019   INFLUENZA VACCINE  11/01/2022   DTaP/Tdap/Td (3 - Td or Tdap) 09/12/2024   HPV VACCINES  Completed   HIV Screening  Completed   Hepatitis C Screening  Discontinued   Health Maintenance  Topic Date Due   COVID-19 Vaccine (1) Never done   PAP SMEAR-Modifier  07/12/2019   INFLUENZA VACCINE  11/01/2022   DTaP/Tdap/Td (3 - Td or Tdap) 09/12/2024   HPV VACCINES  Completed   HIV Screening  Completed   Hepatitis C Screening  Discontinued   Health Maintenance  Topic Date Due   COVID-19 Vaccine (1) Never done   PAP SMEAR-Modifier  07/12/2019    INFLUENZA VACCINE  11/01/2022   DTaP/Tdap/Td (3 - Td or Tdap) 09/12/2024   HPV VACCINES  Completed  HIV Screening  Completed   Hepatitis C Screening  Discontinued     Last Dental Exam: Dr. Sterling Big in [place], last 2023, q65m Last Eye Exam: [facility], last 2023, contacts Last Derm: Zoe Draelos in [place], last fall 2021.    Patient Care Team: Lucky Cowboy, MD as PCP - General (Internal Medicine) Iva Boop, MD as Consulting Physician (Gastroenterology) Jerene Bears, MD as Consulting Physician (Gynecology) Gae Dry, MD (Dermatology)  Surgical History:  She has a past surgical history that includes Wisdom tooth extraction; gum grafts; and laparoscopy (02/19/2011). Family History:  Herfamily history includes Bladder Cancer (age of onset: 35) in her paternal grandfather; Breast cancer (age of onset: 57) in her maternal grandmother; Cancer in her maternal grandfather and paternal grandmother; Heart disease in her brother, maternal grandfather, maternal grandmother, paternal grandfather, and paternal grandmother; Heart murmur in her brother; Hyperlipidemia in her paternal grandfather; Hypertension in her paternal grandfather; Melanoma in her father, maternal uncle, and paternal grandfather; Pancreatic cancer in her maternal grandmother; Stroke (age of onset: 53) in her mother. Social History:  She reports that she has never smoked. She has never used smokeless tobacco. She reports that she does not drink alcohol and does not use drugs.    Review of Systems:  Review of Systems  Constitutional:  Positive for malaise/fatigue (chronic fatigue). Negative for weight loss.  HENT:  Negative for hearing loss and tinnitus.   Eyes:  Negative for blurred vision and double vision.  Respiratory:  Negative for cough, sputum production, shortness of breath and wheezing.   Cardiovascular:  Negative for chest pain, palpitations, orthopnea, claudication, leg swelling and PND.  Gastrointestinal:   Negative for abdominal pain, blood in stool, constipation, diarrhea, heartburn, melena, nausea and vomiting.  Genitourinary: Negative.   Musculoskeletal:  Negative for falls, joint pain and myalgias.  Skin:  Negative for rash.  Neurological:  Negative for dizziness, tingling, sensory change and weakness.  Endo/Heme/Allergies:  Positive for environmental allergies. Negative for polydipsia.  Psychiatric/Behavioral:  Negative for depression, memory loss, substance abuse and suicidal ideas. The patient is not nervous/anxious (improved) and does not have insomnia.   All other systems reviewed and are negative.   Physical Exam: Estimated body mass index is 27.16 kg/m as calculated from the following:   Height as of this encounter: 5\' 5"  (1.651 m).   Weight as of this encounter: 163 lb 3.2 oz (74 kg). BP 128/82   Pulse 80   Temp 97.9 F (36.6 C)   Resp 16   Ht 5\' 5"  (1.651 m)   Wt 163 lb 3.2 oz (74 kg)   SpO2 96%   BMI 27.16 kg/m  General Appearance: Well nourished, in no apparent distress.  Eyes: PERRLA, EOMs, conjunctiva no swelling or erythema Sinuses: No Frontal/maxillary tenderness  ENT/Mouth: Ext aud canals clear, normal light reflex with TMs without erythema, bulging. Good dentition. No erythema, swelling, or exudate on post pharynx. Tonsils not swollen or erythematous. Hearing normal.  Neck: Supple, thyroid normal. No bruits  Respiratory: Respiratory effort normal, BS equal bilaterally without rales, rhonchi, wheezing or stridor.  Cardio: RRR without murmurs, rubs or gallops. Brisk peripheral pulses without edema.  Chest: symmetric, with normal excursions and percussion.  Breasts: breasts appear normal, no suspicious masses, no skin or nipple changes or axillary nodes.  Abdomen: Soft, nontender, no guarding, rebound, hernias, masses, or organomegaly.  Lymphatics: Non tender without lymphadenopathy.  Pelvic exam: normal external genitalia, vulva, vagina, cervix, uterus and adnexa.  Thin prep Pap  taken and submitted.  Musculoskeletal: Full ROM all peripheral extremities,5/5 strength, and normal gait.  Skin: Warm, dry without rashes, lesions, ecchymosis. Neuro: Cranial nerves intact, reflexes equal bilaterally. Normal muscle tone, no cerebellar symptoms. Sensation intact.  Psych: Awake and oriented X 3, normal affect, Insight and Judgment appropriate.   EKG: NSR, no ST changes  Frisco Cordts Hollie Salk, NP-C 10:23 AM [facility]

## 2022-07-06 ENCOUNTER — Encounter: Payer: Self-pay | Admitting: Nurse Practitioner

## 2022-07-06 ENCOUNTER — Ambulatory Visit (INDEPENDENT_AMBULATORY_CARE_PROVIDER_SITE_OTHER): Payer: Managed Care, Other (non HMO) | Admitting: Nurse Practitioner

## 2022-07-06 VITALS — BP 128/82 | HR 80 | Temp 97.9°F | Resp 16 | Ht 65.0 in | Wt 163.2 lb

## 2022-07-06 DIAGNOSIS — F909 Attention-deficit hyperactivity disorder, unspecified type: Secondary | ICD-10-CM

## 2022-07-06 DIAGNOSIS — Z79899 Other long term (current) drug therapy: Secondary | ICD-10-CM

## 2022-07-06 DIAGNOSIS — M1990 Unspecified osteoarthritis, unspecified site: Secondary | ICD-10-CM

## 2022-07-06 DIAGNOSIS — E559 Vitamin D deficiency, unspecified: Secondary | ICD-10-CM | POA: Diagnosis not present

## 2022-07-06 DIAGNOSIS — Z1389 Encounter for screening for other disorder: Secondary | ICD-10-CM | POA: Diagnosis not present

## 2022-07-06 DIAGNOSIS — Z136 Encounter for screening for cardiovascular disorders: Secondary | ICD-10-CM

## 2022-07-06 DIAGNOSIS — R000 Tachycardia, unspecified: Secondary | ICD-10-CM | POA: Diagnosis not present

## 2022-07-06 DIAGNOSIS — Z30011 Encounter for initial prescription of contraceptive pills: Secondary | ICD-10-CM

## 2022-07-06 DIAGNOSIS — Z13220 Encounter for screening for lipoid disorders: Secondary | ICD-10-CM

## 2022-07-06 DIAGNOSIS — Z0000 Encounter for general adult medical examination without abnormal findings: Secondary | ICD-10-CM | POA: Diagnosis not present

## 2022-07-06 DIAGNOSIS — Z124 Encounter for screening for malignant neoplasm of cervix: Secondary | ICD-10-CM

## 2022-07-06 DIAGNOSIS — F419 Anxiety disorder, unspecified: Secondary | ICD-10-CM

## 2022-07-06 DIAGNOSIS — E669 Obesity, unspecified: Secondary | ICD-10-CM

## 2022-07-06 DIAGNOSIS — Z130 Encounter for screening for diseases of the blood and blood-forming organs and certain disorders involving the immune mechanism: Secondary | ICD-10-CM | POA: Diagnosis not present

## 2022-07-06 DIAGNOSIS — E538 Deficiency of other specified B group vitamins: Secondary | ICD-10-CM

## 2022-07-06 DIAGNOSIS — Z0001 Encounter for general adult medical examination with abnormal findings: Secondary | ICD-10-CM

## 2022-07-06 DIAGNOSIS — E0500 Thyrotoxicosis with diffuse goiter without thyrotoxic crisis or storm: Secondary | ICD-10-CM

## 2022-07-06 DIAGNOSIS — Z131 Encounter for screening for diabetes mellitus: Secondary | ICD-10-CM | POA: Diagnosis not present

## 2022-07-06 DIAGNOSIS — E663 Overweight: Secondary | ICD-10-CM

## 2022-07-06 MED ORDER — ETONOGESTREL-ETHINYL ESTRADIOL 0.12-0.015 MG/24HR VA RING
VAGINAL_RING | VAGINAL | 3 refills | Status: DC
Start: 2022-07-06 — End: 2023-12-04

## 2022-07-06 MED ORDER — MELOXICAM 15 MG PO TABS: 15.0000 mg | ORAL_TABLET | Freq: Every day | ORAL | 0 refills | Status: DC

## 2022-07-06 MED ORDER — AMPHETAMINE-DEXTROAMPHETAMINE 30 MG PO TABS
ORAL_TABLET | ORAL | 0 refills | Status: DC
Start: 2022-07-06 — End: 2022-07-25

## 2022-07-06 MED ORDER — METOPROLOL TARTRATE 25 MG PO TABS
ORAL_TABLET | ORAL | 3 refills | Status: DC
Start: 2022-07-06 — End: 2023-12-04

## 2022-07-06 MED ORDER — ALPRAZOLAM 0.5 MG PO TABS
ORAL_TABLET | ORAL | 0 refills | Status: DC
Start: 2022-07-06 — End: 2023-03-15

## 2022-07-06 NOTE — Patient Instructions (Signed)
  Increase Metoprolol to 25 mg BID, monitor BP and pulse.  If BP < 100/60 or pulse < 60 notify the office   GENERAL HEALTH GOALS   Know what a healthy weight is for you (roughly BMI <25) and aim to maintain this   Aim for 7+ servings of fruits and vegetables daily   70-80+ fluid ounces of water or unsweet tea for healthy kidneys   Limit to max 1 drink of alcohol per day; avoid smoking/tobacco   Limit animal fats in diet for cholesterol and heart health - choose grass fed whenever available   Avoid highly processed foods, and foods high in saturated/trans fats   Aim for low stress - take time to unwind and care for your mental health   Aim for 150 min of moderate intensity exercise weekly for heart health, and weights twice weekly for bone health   Aim for 7-9 hours of sleep daily

## 2022-07-06 NOTE — Addendum Note (Signed)
Addended by: Anda Kraft E on: 07/06/2022 10:55 AM   Modules accepted: Orders

## 2022-07-07 LAB — CBC WITH DIFFERENTIAL/PLATELET
Absolute Monocytes: 340 cells/uL (ref 200–950)
Basophils Absolute: 50 cells/uL (ref 0–200)
Basophils Relative: 1 %
Eosinophils Absolute: 120 cells/uL (ref 15–500)
Eosinophils Relative: 2.4 %
HCT: 40.7 % (ref 35.0–45.0)
Hemoglobin: 13.2 g/dL (ref 11.7–15.5)
Lymphs Abs: 1675 cells/uL (ref 850–3900)
MCH: 29.5 pg (ref 27.0–33.0)
MCHC: 32.4 g/dL (ref 32.0–36.0)
MCV: 90.8 fL (ref 80.0–100.0)
MPV: 11.8 fL (ref 7.5–12.5)
Monocytes Relative: 6.8 %
Neutro Abs: 2815 cells/uL (ref 1500–7800)
Neutrophils Relative %: 56.3 %
Platelets: 319 10*3/uL (ref 140–400)
RBC: 4.48 10*6/uL (ref 3.80–5.10)
RDW: 11.8 % (ref 11.0–15.0)
Total Lymphocyte: 33.5 %
WBC: 5 10*3/uL (ref 3.8–10.8)

## 2022-07-07 LAB — COMPLETE METABOLIC PANEL WITH GFR
AG Ratio: 1.6 (calc) (ref 1.0–2.5)
ALT: 12 U/L (ref 6–29)
AST: 12 U/L (ref 10–30)
Albumin: 4.6 g/dL (ref 3.6–5.1)
Alkaline phosphatase (APISO): 67 U/L (ref 31–125)
BUN: 14 mg/dL (ref 7–25)
CO2: 23 mmol/L (ref 20–32)
Calcium: 9.2 mg/dL (ref 8.6–10.2)
Chloride: 108 mmol/L (ref 98–110)
Creat: 0.79 mg/dL (ref 0.50–0.97)
Globulin: 2.9 g/dL (calc) (ref 1.9–3.7)
Glucose, Bld: 94 mg/dL (ref 65–99)
Potassium: 4.6 mmol/L (ref 3.5–5.3)
Sodium: 143 mmol/L (ref 135–146)
Total Bilirubin: 0.3 mg/dL (ref 0.2–1.2)
Total Protein: 7.5 g/dL (ref 6.1–8.1)
eGFR: 99 mL/min/{1.73_m2} (ref >=60)

## 2022-07-07 LAB — URINALYSIS, ROUTINE W REFLEX MICROSCOPIC
Bilirubin Urine: NEGATIVE
Glucose, UA: NEGATIVE
Hgb urine dipstick: NEGATIVE
Ketones, ur: NEGATIVE
Leukocytes,Ua: NEGATIVE
Nitrite: NEGATIVE
Protein, ur: NEGATIVE
Specific Gravity, Urine: 1.027 (ref 1.001–1.035)
pH: 5.5 (ref 5.0–8.0)

## 2022-07-07 LAB — TSH: TSH: 0.39 mIU/L — ABNORMAL LOW

## 2022-07-07 LAB — HEMOGLOBIN A1C
Hgb A1c MFr Bld: 5.2 % of total Hgb (ref <5.7)
Mean Plasma Glucose: 103 mg/dL
eAG (mmol/L): 5.7 mmol/L

## 2022-07-07 LAB — LIPID PANEL
Cholesterol: 177 mg/dL (ref <200)
HDL: 80 mg/dL (ref >=50)
LDL Cholesterol (Calc): 77 mg/dL (calc)
Non-HDL Cholesterol (Calc): 97 mg/dL (calc) (ref <130)
Total CHOL/HDL Ratio: 2.2 (calc) (ref <5.0)
Triglycerides: 123 mg/dL (ref <150)

## 2022-07-07 LAB — VITAMIN D 25 HYDROXY (VIT D DEFICIENCY, FRACTURES): Vit D, 25-Hydroxy: 44 ng/mL (ref 30–100)

## 2022-07-07 LAB — MICROALBUMIN / CREATININE URINE RATIO
Creatinine, Urine: 168 mg/dL (ref 20–275)
Microalb Creat Ratio: 1 mg/g creat (ref <30)
Microalb, Ur: 0.2 mg/dL

## 2022-07-07 LAB — VITAMIN B12: Vitamin B-12: 464 pg/mL (ref 200–1100)

## 2022-07-07 LAB — MAGNESIUM: Magnesium: 2.1 mg/dL (ref 1.5–2.5)

## 2022-07-08 ENCOUNTER — Other Ambulatory Visit: Payer: Self-pay | Admitting: Nurse Practitioner

## 2022-07-08 DIAGNOSIS — R7989 Other specified abnormal findings of blood chemistry: Secondary | ICD-10-CM

## 2022-07-09 ENCOUNTER — Encounter: Payer: Self-pay | Admitting: Nurse Practitioner

## 2022-07-09 ENCOUNTER — Other Ambulatory Visit: Payer: Self-pay | Admitting: Nurse Practitioner

## 2022-07-09 DIAGNOSIS — E538 Deficiency of other specified B group vitamins: Secondary | ICD-10-CM

## 2022-07-09 DIAGNOSIS — E559 Vitamin D deficiency, unspecified: Secondary | ICD-10-CM

## 2022-07-09 LAB — PAP, TP IMAGING W/ HPV RNA, RFLX HPV TYPE 16,18/45: HPV DNA High Risk: NOT DETECTED

## 2022-07-09 LAB — PAP, TP IMAGING, WNL RFLX HPV

## 2022-07-09 MED ORDER — VITAMIN D3 1.25 MG (50000 UT) PO CAPS
50000.0000 [IU] | ORAL_CAPSULE | ORAL | 3 refills | Status: DC
Start: 2022-07-09 — End: 2023-12-04

## 2022-07-09 MED ORDER — CYANOCOBALAMIN 1000 MCG/ML IJ SOLN: INTRAMUSCULAR | 3 refills | Status: DC

## 2022-07-25 ENCOUNTER — Other Ambulatory Visit: Payer: Self-pay | Admitting: Nurse Practitioner

## 2022-07-25 ENCOUNTER — Telehealth: Payer: Self-pay | Admitting: Nurse Practitioner

## 2022-07-25 DIAGNOSIS — F909 Attention-deficit hyperactivity disorder, unspecified type: Secondary | ICD-10-CM

## 2022-07-25 MED ORDER — AMPHETAMINE-DEXTROAMPHETAMINE 30 MG PO TABS
ORAL_TABLET | ORAL | 0 refills | Status: DC
Start: 2022-07-25 — End: 2022-09-11

## 2022-07-25 NOTE — Progress Notes (Signed)
Was unable to get Adderall with last refill as was shortage of medication.  PDM reviewed and appropriate, will resend script today

## 2022-07-25 NOTE — Telephone Encounter (Signed)
Patient says she was never able to pickup Adderall due to an issue [facility] was having and is needing [place] to resend it

## 2022-07-30 ENCOUNTER — Encounter (HOSPITAL_BASED_OUTPATIENT_CLINIC_OR_DEPARTMENT_OTHER): Payer: Self-pay | Admitting: Emergency Medicine

## 2022-07-30 ENCOUNTER — Other Ambulatory Visit: Payer: Self-pay

## 2022-07-30 ENCOUNTER — Emergency Department (HOSPITAL_BASED_OUTPATIENT_CLINIC_OR_DEPARTMENT_OTHER)
Admission: EM | Admit: 2022-07-30 | Discharge: 2022-07-30 | Disposition: A | Discharge status: Home / Self Care | Payer: Managed Care, Other (non HMO) | Attending: Emergency Medicine | Admitting: Emergency Medicine

## 2022-07-30 DIAGNOSIS — Z23 Encounter for immunization: Secondary | ICD-10-CM | POA: Diagnosis not present

## 2022-07-30 DIAGNOSIS — S0101XA Laceration without foreign body of scalp, initial encounter: Secondary | ICD-10-CM | POA: Insufficient documentation

## 2022-07-30 DIAGNOSIS — S0990XA Unspecified injury of head, initial encounter: Secondary | ICD-10-CM | POA: Diagnosis present

## 2022-07-30 DIAGNOSIS — W5503XA Scratched by cat, initial encounter: Secondary | ICD-10-CM | POA: Diagnosis not present

## 2022-07-30 DIAGNOSIS — S01412A Laceration without foreign body of left cheek and temporomandibular area, initial encounter: Secondary | ICD-10-CM | POA: Insufficient documentation

## 2022-07-30 MED ORDER — AZITHROMYCIN 250 MG PO TABS: ORAL_TABLET | ORAL | 0 refills | Status: DC

## 2022-07-30 MED ORDER — TETANUS-DIPHTH-ACELL PERTUSSIS 5-2.5-18.5 LF-MCG/0.5 IM SUSY
0.5000 mL | PREFILLED_SYRINGE | Freq: Once | INTRAMUSCULAR | Status: AC
  Administered 2022-07-30: 0.5 mL via INTRAMUSCULAR
  Filled 2022-07-30: qty 0.5

## 2022-07-30 MED ORDER — AZITHROMYCIN 250 MG PO TABS
500.0000 mg | ORAL_TABLET | Freq: Once | ORAL | Status: AC
  Administered 2022-07-30: 500 mg via ORAL
  Filled 2022-07-30: qty 2

## 2022-07-30 NOTE — ED Notes (Signed)
Left cheek scratches and scalp cleaned with hydrogen peroxide

## 2022-07-30 NOTE — ED Provider Notes (Addendum)
[place] [facility] Provider Note   CSN: [id number] Arrival date & time: 07/30/22  0941     History  Chief Complaint  Patient presents with   Animal Bite    Sara Brewer is a 37 y.o. female with history of ADD, Graves disease, GERD, anxiety, vitamin B12 and D deficiency, who presents to the ER complaining of multiple cat scratches. States she was attacked by a cat that lives around her complex, has been feeding it for about 10 months or so. Cat not vaccinated. She believes she does have access to the cat for animal control evaluation. Does not believe she was bit. States she was trying to put the cat in a cage to take it to the vet to have it fixed, but the cat panicked and scratched at her head and face multiple times. Last tetanus in 2016.    Animal Bite      Home Medications Prior to Admission medications   Medication Sig Start Date End Date Taking? Authorizing Provider  azithromycin (ZITHROMAX) 250 MG tablet Take one tablet daily for 4 days. 07/30/22  Yes Mikaila Grunert T, PA-C  ALPRAZolam (XANAX) 0.5 MG tablet Take 1/2-1 tab once or twice a day if needed only for panic attacks. Limit to <5 days per week to avoid tolerance. 07/06/22   Raynelle Dick, NP  amphetamine-dextroamphetamine (ADDERALL) 30 MG tablet Take 1 tablet daily as needed for Focus & Concentration; try to limit to <5 /week due to addictive potential. Should last longer than 30 days. 07/25/22   Raynelle Dick, NP  Cholecalciferol (VITAMIN D3) 1.25 MG (50000 UT) capsule Take 1 capsule (50,000 Units total) by mouth once a week. 07/09/22   Raynelle Dick, NP  cyanocobalamin (VITAMIN B12) 1000 MCG/ML injection INJECT 1 ML INTO THE SKIN EVERY 30 DAYS 07/09/22   Raynelle Dick, NP  etonogestrel-ethinyl estradiol (NUVARING) 0.12-0.015 MG/24HR vaginal ring Insert vaginally and leave in place for 3 consecutive weeks, then remove for 1 week. 07/06/22   Raynelle Dick, NP  FIBER  COMPLETE PO Take by mouth. Patient not taking: Reported on 02/20/2022    [provider]  meloxicam (MOBIC) 15 MG tablet Take 1 tablet (15 mg total) by mouth daily. 07/06/22   Raynelle Dick, NP  metoprolol tartrate (LOPRESSOR) 25 MG tablet Take 1 tablet ( 12.5 mg ) twice a day 07/06/22   Raynelle Dick, NP  Multiple Vitamin (MULTIVITAMIN) tablet Take 1 tablet by mouth daily.    [provider]  Probiotic Product (PROBIOTIC DAILY PO) Take 1 tablet by mouth daily.    [provider]      Allergies    Codeine, Milk-related compounds, Sulfonamide derivatives, and Penicillins    Review of Systems   Review of Systems  Skin:  Positive for wound.  All other systems reviewed and are negative.   Physical Exam Updated Vital Signs BP (!) 133/102   Pulse (!) 109   Temp 98.4 F (36.9 C) (Oral)   Resp 18   Wt 72.6 kg   LMP 07/26/2022 (Approximate)   SpO2 100%   BMI 26.63 kg/m  Physical Exam Vitals and nursing note reviewed.  Constitutional:      Appearance: Normal appearance.  HENT:     Head: Normocephalic.     Comments: 2 superficial lacerations noted to the left cheek. 2 deeper lacerations noted to the scalp at the crown of the head, extending only to dermal tissue.  No puncture wounds. Eyes:     Conjunctiva/sclera: Conjunctivae normal.  Pulmonary:     Effort: Pulmonary effort is normal. No respiratory distress.  Skin:    General: Skin is warm and dry.  Neurological:     Mental Status: She is alert.  Psychiatric:        Mood and Affect: Mood normal.        Behavior: Behavior normal.     ED Results / Procedures / Treatments   Labs (all labs ordered are listed, but only abnormal results are displayed) Labs Reviewed - No data to display  EKG None  Radiology No results found.  Procedures Procedures    Medications Ordered in ED Medications  azithromycin (ZITHROMAX) tablet 500 mg (has no administration in time range)  Tdap (BOOSTRIX)  injection 0.5 mL (0.5 mLs Intramuscular Given 07/30/22 1136)    ED Course/ Medical Decision Making/ A&P                             Medical Decision Making Risk Prescription drug management.   This patient is a 37 y.o. female who presents to the ED for concern of cat scratch.   Past Medical History / Social History / Additional history: Chart reviewed. Pertinent results include: ADD, Graves disease, GERD, anxiety, vitamin B12 and D deficiency  Physical Exam: Physical exam performed. The pertinent findings include: Superficial lacerations to the left cheek, two deeper lacerations to the scalp only involving dermal tissue, no puncture wounds  Medications / Treatment: Wounds irrigated with saline and hydrogen peroxide. Updated tetanus. Given initial dose of antibiotics.   Will forego closure of wounds at this time due to increased risk of infection transmission.   Disposition: After consideration of the diagnostic results and the patients response to treatment, I feel that emergency department workup does not suggest an emergent condition requiring admission or immediate intervention beyond what has been performed at this time. The plan is: discharge to home with antibiotics for cat scratch from feral animal. Strongly advised patient to contact poison control for evaluation of the animal in the unlikely event animal shows signs of rabies. However, without evidence of cat bite, transmission of rabies likelihood significantly unlikely. Thoroughly discussed rabies vaccination and immunoglobulin and the need for such if the animal were to test positive, but I think it's reasonable to forego this at this time. The patient is safe for discharge and has been instructed to return immediately for worsening symptoms, change in symptoms or any other concerns.  Final Clinical Impression(s) / ED Diagnoses Final diagnoses:  Cat scratch  Laceration of scalp, initial encounter    Rx / DC Orders ED  Discharge Orders          Ordered    azithromycin (ZITHROMAX) 250 MG tablet        07/30/22 1139           Portions of this report may have been transcribed using voice recognition software. Every effort was made to ensure accuracy; however, inadvertent computerized transcription errors may be present.    Su Monks, PA-C 07/30/22 1140    Rolan Wrightsman T, PA-C 07/30/22 1141    Rondel Baton, MD 07/30/22 [phone]

## 2022-07-30 NOTE — ED Triage Notes (Signed)
Got attacked by a cat , facial and scalp scratches . Do not own the cat and vaccinations records unknown .

## 2022-07-30 NOTE — ED Notes (Signed)
Reviewed discharge instructions and recommendations with pt. Instructed to clean with soap and water. Advised to monitior for s/o infection and follow up if needed. States understanding

## 2022-07-30 NOTE — Discharge Instructions (Addendum)
You were seen in the ER today for cat scratch.  As we discussed, we often don't close these wounds to prevent worsening of any potential infection. We have given you the first dose of antibiotics and updated your tetanus. I've sent a prescription of the rest of the antibiotics to your pharmacy.  While we do not believe the cat bit you, it is very important to contact Animal Control to evaluate the animal. If the animal shows any signs of rabies, this could pose a threat to your life and you need to be seen immediately for rabies vaccinations.   Continue to monitor how you're doing and return to the ER for new or worsening symptoms. If your lacerations develop any surrounding redness, swelling or drainage of pus, please be seen for evaluation immediately.

## 2022-09-10 ENCOUNTER — Ambulatory Visit: Payer: Self-pay | Admitting: Internal Medicine

## 2022-09-11 ENCOUNTER — Other Ambulatory Visit: Payer: Self-pay | Admitting: Nurse Practitioner

## 2022-09-11 DIAGNOSIS — F909 Attention-deficit hyperactivity disorder, unspecified type: Secondary | ICD-10-CM

## 2022-09-11 MED ORDER — AMPHETAMINE-DEXTROAMPHETAMINE 30 MG PO TABS
ORAL_TABLET | ORAL | 0 refills | Status: DC
Start: 2022-09-11 — End: 2022-11-14

## 2022-10-03 ENCOUNTER — Other Ambulatory Visit: Payer: Self-pay | Admitting: Nurse Practitioner

## 2022-10-03 DIAGNOSIS — M1990 Unspecified osteoarthritis, unspecified site: Secondary | ICD-10-CM

## 2022-11-14 ENCOUNTER — Other Ambulatory Visit: Payer: Self-pay | Admitting: Nurse Practitioner

## 2022-11-14 DIAGNOSIS — F909 Attention-deficit hyperactivity disorder, unspecified type: Secondary | ICD-10-CM

## 2022-11-14 MED ORDER — AMPHETAMINE-DEXTROAMPHETAMINE 30 MG PO TABS
ORAL_TABLET | ORAL | 0 refills | Status: DC
Start: 2022-11-14 — End: 2023-01-05

## 2022-12-19 ENCOUNTER — Other Ambulatory Visit: Payer: Self-pay | Admitting: Nurse Practitioner

## 2022-12-19 DIAGNOSIS — M1990 Unspecified osteoarthritis, unspecified site: Secondary | ICD-10-CM

## 2023-01-05 ENCOUNTER — Other Ambulatory Visit: Payer: Self-pay | Admitting: Nurse Practitioner

## 2023-01-05 DIAGNOSIS — F909 Attention-deficit hyperactivity disorder, unspecified type: Secondary | ICD-10-CM

## 2023-01-07 MED ORDER — AMPHETAMINE-DEXTROAMPHETAMINE 30 MG PO TABS
ORAL_TABLET | ORAL | 0 refills | Status: DC
Start: 2023-01-07 — End: 2023-03-15

## 2023-01-07 NOTE — Progress Notes (Unsigned)
6 month follow up  Assessment and Plan:  Graves disease Est with Dr. Evlyn Kanner, monitor TSH, refer back if trending down -     TSH  Vitamin B12 deficiency Continue supplement  -     Vitamin B12  Vitamin D deficiency Continue Vit D supplementation to maintain value in therapeutic level of 60-100   Anxiety state Improved, off of effexor; rare xanax use per PDMP review Stress management techniques discussed, increase water, good sleep hygiene discussed, increase exercise, and increase veggies.    Contraceptive management Continues on Nuvaring  Attention deficit disorder, unspecified hyperactivity presence Med helps with focus, no AE's.- Adderall 30 mg every day  The patient was counseled on the addictive nature of the medication and was encouraged to take drug holidays when not needed. Monitor via PDMP.    Overweight BMI 29 Weight trending up despite fairly good lifestyle habits today Check insulin for resistance, B12, TSH - Grave's might explain, if abnormal plan Dr. Evlyn Kanner follow up Denies difficulty controlling appetite Has tried caloric intake, appropriate  Consider repeat food log Long discussion about weight loss, diet, and exercise Recommended diet heavy in fruits and veggies and low in animal meats, cheeses, and dairy products, appropriate calorie intake Discussed appropriate weight for height Follow up at next visit Start Topamax 50 mg BID to help with weight- did not want to stop Adderall to try Phentermine   Medication management -     CBC with Differential/Platelet -     COMPLETE METABOLIC PANEL WITH GFR -     TSH -     Vitamin B12  Tachycardia Continue Metoprolol to 25 mg BID, monitor BP and pulse.  If BP < 100/60 or pulse < 60 notify the office   Arthritis Was to have surgery on left foot but has put off Use Meloxicam PRN once a day for pain        Discussed med's effects and SE's. Screening labs and tests as requested with regular follow-up as  recommended. Over 37 minutes of exam, counseling, chart review, and complex, high level critical decision making was performed this visit.   Future Appointments  Date Time Provider Department Center  07/08/2023 10:00 AM Sara Dick, NP [facility] None     HPI  36 y.o. female  presents for a complete physical and follow up for has Graves disease; ADHD (attention deficit hyperactivity disorder); Anxiety; Vitamin B12 deficiency; Vitamin D deficiency; Insulin resistance; and Fatigue on their problem list.   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  She does have steady relationship with boyfriend, 3 years.   She has hx of abnormal pap smear with LEEP procedure. Has been on Nuvaring. Hx of irregular and very heavy menses. 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 Has been recommended surgical intervention but postponing.   She has history of Graves, with other family history of autoimmune. She had autoimmune workup in 2019 including ANA, sed rate, RF, anti DNA, HLA B27 antibody, RNP antibody, SSB ([place]) antibody, and SSA ([place]) antibody were unremarkable.  Vitamin D and B12 levels were found to be low and she was advised to initiate supplementation.   With pandemic was having more depression/anxiety; was much improved on effexor, but recently was able to taper off.  She is also prescribed PRN xanax 0.5 mg taking 0-1  tabs/week. Last filled 07/28/22 #60. Has seen numerous counselors in the past without perceived benefit  She is on adderall PRN for hx of ADD, takes on days that she works only, feels significant improvement with focus. Adderall last filled 11/15/22 #30.    BMI is Body mass index is 29.29 kg/m., she admits diet hasn't been as good, trying to make better choices when able.She is up 16 pounds in the past 6 months. Eating is  not different and exercising .  Wt Readings from Last 3 Encounters:  01/08/23 176 lb (79.8 kg)  07/30/22 160 lb (72.6 kg)  07/06/22 163 lb 3.2 oz (74 kg)   Today their BP is BP: 122/62 . She is currently on Metoprolol 25 mg 1 tab bid. Continues to have some shortness of breath which has been evaluated by cardiology.  Will still get occasional runs of elevated pulse but much better on BID Metoprolol BP Readings from Last 3 Encounters:  01/08/23 122/62  07/30/22 120/84  07/06/22 128/82  She does workout. She denies chest pain, shortness of breath, dizziness.   Pulse Readings from Last 3 Encounters:  01/08/23 (!) 112  07/30/22 86  07/06/22 80    Controlled currently without medication. The cholesterol last visit was:   Lab Results  Component Value Date   CHOL 177 07/06/2022   HDL 80 07/06/2022   LDLCALC 77 07/06/2022   TRIG 123 07/06/2022   CHOLHDL 2.2 07/06/2022   Last Z6X in the office was:  Lab Results  Component Value Date   HGBA1C 5.2 07/06/2022   She has documented history of Graves disease, formerly followed by Dr. Evlyn Kanner, did monitoring only, never was treated and advised monitoring only unless TSHs are persistently low and trending. Last TSH was Lab Results  Component Value Date   TSH 0.39 (L) 07/06/2022   Last GFR: Lab Results  Component Value Date   EGFR 99 07/06/2022    Patient is on Vitamin D supplement, has been taking 09604 IU weekly Lab Results  Component Value Date   VD25OH 44 07/06/2022     She takes Vit B12 1000 mcg injection once a month Lab Results  Component Value Date   VITAMINB12 464 07/06/2022      Current Medications:  Current Outpatient Medications on File Prior to Visit  Medication Sig Dispense Refill   ALPRAZolam (XANAX) 0.5 MG tablet Take 1/2-1 tab once or twice a day if needed only for panic attacks. Limit to <5 days per week to avoid tolerance. 60 tablet 0   amphetamine-dextroamphetamine (ADDERALL) 30 MG tablet Take 1 tablet  daily as needed for Focus & Concentration; try to limit to <5 /week due to addictive potential. Should last longer than 30 days. 30 tablet 0   azithromycin (ZITHROMAX) 250 MG tablet Take one tablet daily for 4 days. 4 tablet 0   Cholecalciferol (VITAMIN D3) 1.25 MG (50000 UT) capsule Take 1 capsule (50,000 Units total) by mouth once a week. 12 capsule 3   cyanocobalamin (VITAMIN B12) 1000 MCG/ML injection INJECT 1 ML INTO THE SKIN EVERY 30 DAYS 3 mL 3   etonogestrel-ethinyl estradiol (NUVARING) 0.12-0.015 MG/24HR vaginal ring Insert vaginally and leave in place for 3 consecutive weeks, then remove for 1 week. 3 each 3   FIBER COMPLETE PO Take by mouth. (Patient not taking: Reported on 02/20/2022)     meloxicam (MOBIC) 15 MG tablet TAKE 1 TABLET (15 MG TOTAL) BY MOUTH DAILY. TAKE 1 TAB ONCE A DAY AS NEEDED ,TRY  TO LIMIT TO 5 DAYS A WEEK TO PROTECT KIDNEYS 60 tablet 0   metoprolol tartrate (LOPRESSOR) 25 MG tablet Take 1 tablet ( 12.5 mg ) twice a day 180 tablet 3   Multiple Vitamin (MULTIVITAMIN) tablet Take 1 tablet by mouth daily.     Probiotic Product (PROBIOTIC DAILY PO) Take 1 tablet by mouth daily.     No current facility-administered medications on file prior to visit.   Allergies:  Allergies  Allergen Reactions   Codeine Nausea And Vomiting   Milk-Related Compounds Hives and Nausea And Vomiting   Sulfonamide Derivatives Nausea And Vomiting   Penicillins Nausea And Vomiting    PER PATIENT just nausea and vomiting   Medical History:  She has Graves disease; ADHD (attention deficit hyperactivity disorder); Anxiety; Vitamin B12 deficiency; Vitamin D deficiency; Insulin resistance; and Fatigue on their problem list. Health Maintenance:   Immunization History  Administered Date(s) Administered   HPV Quadrivalent 09/09/2001, 06/14/2005, 10/16/2005   Hepatitis B 04/03/1999   Meningococcal polysaccharide vaccine (MPSV4) 04/03/1999   Td 10/26/2004   Tdap 09/13/2014, 07/30/2022   Health  Maintenance  Topic Date Due   COVID-19 Vaccine (1) Never done   INFLUENZA VACCINE  11/01/2022   Cervical Cancer Screening (HPV/Pap Cotest)  07/06/2027   DTaP/Tdap/Td (4 - Td or Tdap) 07/29/2032   HPV VACCINES  Completed   HIV Screening  Completed   Hepatitis C Screening  Discontinued   Health Maintenance  Topic Date Due   COVID-19 Vaccine (1) Never done   INFLUENZA VACCINE  11/01/2022   Cervical Cancer Screening (HPV/Pap Cotest)  07/06/2027   DTaP/Tdap/Td (4 - Td or Tdap) 07/29/2032   HPV VACCINES  Completed   HIV Screening  Completed   Hepatitis C Screening  Discontinued   Health Maintenance  Topic Date Due   COVID-19 Vaccine (1) Never done   INFLUENZA VACCINE  11/01/2022   Cervical Cancer Screening (HPV/Pap Cotest)  07/06/2027   DTaP/Tdap/Td (4 - Td or Tdap) 07/29/2032   HPV VACCINES  Completed   HIV Screening  Completed   Hepatitis C Screening  Discontinued     Last Dental Exam: Dr. Sterling Big in [place], last 2023, q14m Last Eye Exam: [facility], last 2023, contacts Last Derm: Zoe Draelos in [place], last fall 2021.    Patient Care Team: Lucky Cowboy, MD as PCP - General (Internal Medicine) Iva Boop, MD as Consulting Physician (Gastroenterology) Jerene Bears, MD as Consulting Physician (Gynecology) Gae Dry, MD (Dermatology)  Surgical History:  She has a past surgical history that includes Wisdom tooth extraction; gum grafts; and laparoscopy (02/19/2011). Family History:  Herfamily history includes Bladder Cancer (age of onset: 26) in her paternal grandfather; Breast cancer (age of onset: 34) in her maternal grandmother; Cancer in her maternal grandfather and paternal grandmother; Heart disease in her brother, maternal grandfather, maternal grandmother, paternal grandfather, and paternal grandmother; Heart murmur in her brother; Hyperlipidemia in her paternal grandfather; Hypertension in her paternal grandfather; Melanoma in her father, maternal uncle, and  paternal grandfather; Pancreatic cancer in her maternal grandmother; Stroke (age of onset: 10) in her mother. Social History:  She reports that she has never smoked. She has never used smokeless tobacco. She reports that she does not drink alcohol and does not use drugs.    Review of Systems:  Review of Systems  Constitutional:  Positive for malaise/fatigue (chronic fatigue). Negative for weight loss.  HENT:  Negative for hearing loss and tinnitus.   Eyes:  Negative for  blurred vision and double vision.  Respiratory:  Negative for cough, sputum production, shortness of breath and wheezing.   Cardiovascular:  Negative for chest pain, palpitations, orthopnea, claudication, leg swelling and PND.  Gastrointestinal:  Negative for abdominal pain, blood in stool, constipation, diarrhea, heartburn, melena, nausea and vomiting.  Genitourinary: Negative.   Musculoskeletal:  Negative for falls, joint pain and myalgias.  Skin:  Negative for rash.  Neurological:  Negative for dizziness, tingling, sensory change and weakness.  Endo/Heme/Allergies:  Positive for environmental allergies. Negative for polydipsia.  Psychiatric/Behavioral:  Negative for depression, memory loss, substance abuse and suicidal ideas. The patient is not nervous/anxious (improved) and does not have insomnia.   All other systems reviewed and are negative.   Physical Exam: Estimated body mass index is 29.29 kg/m as calculated from the following:   Height as of this encounter: 5\' 5"  (1.651 m).   Weight as of this encounter: 176 lb (79.8 kg). BP 122/62   Pulse (!) 112   Temp 97.9 F (36.6 C)   Ht 5\' 5"  (1.651 m)   Wt 176 lb (79.8 kg)   LMP 12/10/2022   SpO2 97%   BMI 29.29 kg/m  General Appearance: Well nourished, in no apparent distress.  Eyes: PERRLA, EOMs, conjunctiva no swelling or erythema Sinuses: No Frontal/maxillary tenderness  ENT/Mouth: Ext aud canals clear, normal light reflex with TMs without erythema,  bulging. Good dentition. No erythema, swelling, or exudate on post pharynx.  Hearing normal.  Neck: Supple, thyroid normal. No bruits  Respiratory: Respiratory effort normal, BS equal bilaterally without rales, rhonchi, wheezing or stridor.  Cardio: RRR without murmurs, rubs or gallops. Brisk peripheral pulses without edema.  Chest: symmetric, with normal excursions and percussion.  Abdomen: Soft, nontender, no guarding, rebound, hernias, masses, or organomegaly.  Lymphatics: Non tender without lymphadenopathy.  Musculoskeletal: Full ROM all peripheral extremities,5/5 strength, and normal gait.  Skin: Warm, dry without rashes, lesions, ecchymosis. Neuro: Cranial nerves intact, reflexes equal bilaterally. Normal muscle tone, no cerebellar symptoms. Sensation intact.  Psych: Awake and oriented X 3, normal affect, Insight and Judgment appropriate.   Sara Dick, NP-C 9:48 AM [facility]

## 2023-01-08 ENCOUNTER — Ambulatory Visit (INDEPENDENT_AMBULATORY_CARE_PROVIDER_SITE_OTHER): Payer: No Typology Code available for payment source | Admitting: Nurse Practitioner

## 2023-01-08 ENCOUNTER — Encounter: Payer: Self-pay | Admitting: Nurse Practitioner

## 2023-01-08 VITALS — BP 122/62 | HR 98 | Temp 97.9°F | Ht 65.0 in | Wt 176.0 lb

## 2023-01-08 DIAGNOSIS — E0500 Thyrotoxicosis with diffuse goiter without thyrotoxic crisis or storm: Secondary | ICD-10-CM

## 2023-01-08 DIAGNOSIS — F419 Anxiety disorder, unspecified: Secondary | ICD-10-CM | POA: Diagnosis not present

## 2023-01-08 DIAGNOSIS — E782 Mixed hyperlipidemia: Secondary | ICD-10-CM | POA: Diagnosis not present

## 2023-01-08 DIAGNOSIS — E538 Deficiency of other specified B group vitamins: Secondary | ICD-10-CM | POA: Diagnosis not present

## 2023-01-08 DIAGNOSIS — Z79899 Other long term (current) drug therapy: Secondary | ICD-10-CM

## 2023-01-08 DIAGNOSIS — E663 Overweight: Secondary | ICD-10-CM

## 2023-01-08 DIAGNOSIS — M1990 Unspecified osteoarthritis, unspecified site: Secondary | ICD-10-CM

## 2023-01-08 DIAGNOSIS — E559 Vitamin D deficiency, unspecified: Secondary | ICD-10-CM

## 2023-01-08 DIAGNOSIS — R000 Tachycardia, unspecified: Secondary | ICD-10-CM

## 2023-01-08 DIAGNOSIS — F909 Attention-deficit hyperactivity disorder, unspecified type: Secondary | ICD-10-CM

## 2023-01-08 MED ORDER — TOPIRAMATE 50 MG PO TABS: 50.0000 mg | ORAL_TABLET | Freq: Two times a day (BID) | ORAL | 2 refills | Status: DC

## 2023-01-08 NOTE — Patient Instructions (Addendum)
Fair life protein shakes Eat more frequently - try not to go more than 6 hours without protein Aim for 90 grams of protein a day- 30 breakfast/30 lunch 30 dinner Try to keep net carbs less than 50 Net Carbs=Total Carbs-fiber- sugar alcohols Exercise heartrate 120-140(fat burning zone)- walking 20-30 minutes 4 days a week  

## 2023-01-09 LAB — COMPLETE METABOLIC PANEL WITH GFR
AG Ratio: 1.6 (calc) (ref 1.0–2.5)
ALT: 12 U/L (ref 6–29)
AST: 14 U/L (ref 10–30)
Albumin: 4.4 g/dL (ref 3.6–5.1)
Alkaline phosphatase (APISO): 73 U/L (ref 31–125)
BUN: 15 mg/dL (ref 7–25)
CO2: 23 mmol/L (ref 20–32)
Calcium: 9.5 mg/dL (ref 8.6–10.2)
Chloride: 107 mmol/L (ref 98–110)
Creat: 0.84 mg/dL (ref 0.50–0.97)
Globulin: 2.8 g/dL (ref 1.9–3.7)
Glucose, Bld: 85 mg/dL (ref 65–99)
Potassium: 4.7 mmol/L (ref 3.5–5.3)
Sodium: 140 mmol/L (ref 135–146)
Total Bilirubin: 0.3 mg/dL (ref 0.2–1.2)
Total Protein: 7.2 g/dL (ref 6.1–8.1)
eGFR: 92 mL/min/{1.73_m2} (ref >=60)

## 2023-01-09 LAB — CBC WITH DIFFERENTIAL/PLATELET
Absolute Monocytes: 640 {cells}/uL (ref 200–950)
Basophils Absolute: 71 {cells}/uL (ref 0–200)
Basophils Relative: 0.9 %
Eosinophils Absolute: 190 {cells}/uL (ref 15–500)
Eosinophils Relative: 2.4 %
HCT: 39.9 % (ref 35.0–45.0)
Hemoglobin: 13.2 g/dL (ref 11.7–15.5)
Lymphs Abs: 2117 {cells}/uL (ref 850–3900)
MCH: 29.7 pg (ref 27.0–33.0)
MCHC: 33.1 g/dL (ref 32.0–36.0)
MCV: 89.7 fL (ref 80.0–100.0)
MPV: 11.7 fL (ref 7.5–12.5)
Monocytes Relative: 8.1 %
Neutro Abs: 4882 {cells}/uL (ref 1500–7800)
Neutrophils Relative %: 61.8 %
Platelets: 360 10*3/uL (ref 140–400)
RBC: 4.45 10*6/uL (ref 3.80–5.10)
RDW: 12.6 % (ref 11.0–15.0)
Total Lymphocyte: 26.8 %
WBC: 7.9 10*3/uL (ref 3.8–10.8)

## 2023-01-09 LAB — TSH: TSH: 0.61 m[IU]/L

## 2023-01-09 LAB — VITAMIN B12: Vitamin B-12: 477 pg/mL (ref 200–1100)

## 2023-02-14 ENCOUNTER — Ambulatory Visit: Payer: Self-pay | Admitting: Surgery

## 2023-02-14 ENCOUNTER — Other Ambulatory Visit: Payer: Self-pay | Admitting: Nurse Practitioner

## 2023-02-14 DIAGNOSIS — M1990 Unspecified osteoarthritis, unspecified site: Secondary | ICD-10-CM

## 2023-02-22 ENCOUNTER — Encounter (HOSPITAL_COMMUNITY)
Admission: RE | Admit: 2023-02-22 | Discharge: 2023-02-22 | Disposition: A | Discharge status: Home / Self Care | Payer: 59 | Source: Ambulatory Visit | Attending: Surgery | Admitting: Surgery

## 2023-02-22 ENCOUNTER — Encounter (HOSPITAL_COMMUNITY): Payer: Self-pay

## 2023-02-22 ENCOUNTER — Other Ambulatory Visit: Payer: Self-pay

## 2023-02-22 VITALS — BP 114/78 | HR 80 | Temp 98.3°F | Resp 16 | Ht 69.0 in | Wt 186.0 lb

## 2023-02-22 DIAGNOSIS — Z01818 Encounter for other preprocedural examination: Secondary | ICD-10-CM | POA: Insufficient documentation

## 2023-02-22 LAB — CBC
HCT: 41.3 % (ref 36.0–46.0)
Hemoglobin: 14.1 g/dL (ref 12.0–15.0)
MCH: 30.5 pg (ref 26.0–34.0)
MCHC: 34.1 g/dL (ref 30.0–36.0)
MCV: 89.4 fL (ref 80.0–100.0)
Platelets: 219 10*3/uL (ref 150–400)
RBC: 4.62 MIL/uL (ref 3.87–5.11)
RDW: 12.7 % (ref 11.5–15.5)
WBC: 8.4 10*3/uL (ref 4.0–10.5)
nRBC: 0 % (ref 0.0–0.2)

## 2023-02-22 NOTE — Progress Notes (Addendum)
Anesthesia Review:  PCP: none  Cardiologist : none  Chest x-ray : EKG : Echo : Stress test: Cardiac Cath :  Activity level: can do a flight of stairs without difficutly  Sleep Study/ CPAP : none  Fasting Blood Sugar :      / Checks Blood Sugar -- times a day:   Blood Thinner/ Instructions /Last Dose: ASA / Instructions/ Last Dose :

## 2023-02-22 NOTE — Patient Instructions (Signed)
SURGICAL WAITING ROOM VISITATION  Patients having surgery or a procedure may have no more than 2 support people in the waiting area - these visitors may rotate.    Children under the age of 92 must have an adult with them who is not the patient.  Due to an increase in RSV and influenza rates and associated hospitalizations, children ages 49 and under may not visit patients in [facility] hospitals.  If the patient needs to stay at the hospital during part of their recovery, the visitor guidelines for inpatient rooms apply. Pre-op nurse will coordinate an appropriate time for 1 support person to accompany patient in pre-op.  This support person may not rotate.    Please refer to the [facility] website for the visitor guidelines for Inpatients (after your surgery is over and you are in a regular room).       Your procedure is scheduled on:  03/14/2023    Report to [facility] Main Entrance    Report to admitting at   0630   Call this number if you have problems the morning of surgery [phone]   Do not eat food :After Midnight.   After Midnight you may have the following liquids until __ 0530____ AM  DAY OF SURGERY  Water Non-Citrus Juices (without pulp, NO RED-Apple, White grape, White cranberry) Black Coffee (NO MILK/CREAM OR CREAMERS, sugar ok)  Clear Tea (NO MILK/CREAM OR CREAMERS, sugar ok) regular and decaf                             Plain Jell-O (NO RED)                                           Fruit ices (not with fruit pulp, NO RED)                                     Popsicles (NO RED)                                                               Sports drinks like Gatorade (NO RED)      The day of surgery:  Drink ONE (1) Pre-Surgery Clear Ensure or G2 at 0530AM ( have completed by )  the morning of surgery. Drink in one sitting. Do not sip.  This drink was given to you during your hospital  pre-op appointment visit. Nothing else to drink after  completing the  Pre-Surgery Clear Ensure or G2.          If you have questions, please contact your surgeon's office.       Oral Hygiene is also important to reduce your risk of infection.                                    Remember - BRUSH YOUR TEETH THE MORNING OF SURGERY WITH YOUR REGULAR TOOTHPASTE  DENTURES WILL BE REMOVED PRIOR TO SURGERY PLEASE DO NOT APPLY "  Poly grip" OR ADHESIVES!!!   Do NOT smoke after Midnight   Stop all vitamins and herbal supplements 7 days before surgery.   Take these medicines the morning of surgery with A SIP OF WATER:  none   DO NOT TAKE ANY ORAL DIABETIC MEDICATIONS DAY OF YOUR SURGERY  Bring CPAP mask and tubing day of surgery.                              You may not have any metal on your body including hair pins, jewelry, and body piercing             Do not wear make-up, lotions, powders, perfumes/cologne, or deodorant  Do not wear nail polish including gel and S&S, artificial/acrylic nails, or any other type of covering on natural nails including finger and toenails. If you have artificial nails, gel coating, etc. that needs to be removed by a nail salon please have this removed prior to surgery or surgery may need to be canceled/ delayed if the surgeon/ anesthesia feels like they are unable to be safely monitored.   Do not shave  48 hours prior to surgery.               Men may shave face and neck.   Do not bring valuables to the hospital. [facility] IS NOT             RESPONSIBLE   FOR VALUABLES.   Contacts, glasses, dentures or bridgework may not be worn into surgery.   Bring small overnight bag day of surgery.   DO NOT BRING YOUR HOME MEDICATIONS TO THE HOSPITAL. PHARMACY WILL DISPENSE MEDICATIONS LISTED ON YOUR MEDICATION LIST TO YOU DURING YOUR ADMISSION IN THE HOSPITAL!    Patients discharged on the day of surgery will not be allowed to drive home.  Someone NEEDS to stay with you for the first 24 hours after  anesthesia.   Special Instructions: Bring a copy of your healthcare power of attorney and living will documents the day of surgery if you haven't scanned them before.              Please read over the following fact sheets you were given: IF YOU HAVE QUESTIONS ABOUT YOUR PRE-OP INSTRUCTIONS PLEASE CALL [phone]   If you received a COVID test during your pre-op visit  it is requested that you wear a mask when out in public, stay away from anyone that may not be feeling well and notify your surgeon if you develop symptoms. If you test positive for Covid or have been in contact with anyone that has tested positive in the last 10 days please notify you surgeon.    [place] - Preparing for Surgery Before surgery, you can play an important role.  Because skin is not sterile, your skin needs to be as free of germs as possible.  You can reduce the number of germs on your skin by washing with CHG (chlorahexidine gluconate) soap before surgery.  CHG is an antiseptic cleaner which kills germs and bonds with the skin to continue killing germs even after washing. Please DO NOT use if you have an allergy to CHG or antibacterial soaps.  If your skin becomes reddened/irritated stop using the CHG and inform your nurse when you arrive at Short Stay. Do not shave (including legs and underarms) for at least 48 hours prior to the first CHG shower.  You may  shave your face/neck. Please follow these instructions carefully:  1.  Shower with CHG Soap the night before surgery and the  morning of Surgery.  2.  If you choose to wash your hair, wash your hair first as usual with your  normal  shampoo.  3.  After you shampoo, rinse your hair and body thoroughly to remove the  shampoo.                           4.  Use CHG as you would any other liquid soap.  You can apply chg directly  to the skin and wash                       Gently with a scrungie or clean washcloth.  5.  Apply the CHG Soap to your body ONLY FROM THE  NECK DOWN.   Do not use on face/ open                           Wound or open sores. Avoid contact with eyes, ears mouth and genitals (private parts).                       Wash face,  Genitals (private parts) with your normal soap.             6.  Wash thoroughly, paying special attention to the area where your surgery  will be performed.  7.  Thoroughly rinse your body with warm water from the neck down.  8.  DO NOT shower/wash with your normal soap after using and rinsing off  the CHG Soap.                9.  Pat yourself dry with a clean towel.            10.  Wear clean pajamas.            11.  Place clean sheets on your bed the night of your first shower and do not  sleep with pets. Day of Surgery : Do not apply any lotions/deodorants the morning of surgery.  Please wear clean clothes to the hospital/surgery center.  FAILURE TO FOLLOW THESE INSTRUCTIONS MAY RESULT IN THE CANCELLATION OF YOUR SURGERY PATIENT SIGNATURE_________________________________  NURSE SIGNATURE__________________________________  ________________________________________________________________________

## 2023-03-04 ENCOUNTER — Encounter (HOSPITAL_COMMUNITY): Payer: Self-pay | Admitting: Surgery

## 2023-03-04 DIAGNOSIS — K439 Ventral hernia without obstruction or gangrene: Secondary | ICD-10-CM | POA: Diagnosis present

## 2023-03-04 NOTE — H&P (Signed)
PROVIDER: Baltasar Twilley Myra Rude, MD   Chief Complaint: FOLLOW UP VISIT (HERNIA)  History of Present Illness:  Patient had previously been evaluated in 2023 with a soft tissue mass in the upper abdominal wall in the midline felt to represent an epigastric hernia versus a soft tissue mass such as a lipoma. Decision was made to proceed with surgery but due to restrictions with her job she was not able to proceed. Patient now returns to discuss proceeding with surgery for repair of what appears to be a epigastric hernia. Patient states that it may have become slightly larger. She is having intermittent discomfort. She has had no signs or symptoms of intestinal obstruction. She has had no additional imaging studies. He works at [employer].   Review of Systems: A complete review of systems was obtained from the patient. I have reviewed this information and discussed as appropriate with the patient. See HPI as well for other ROS.  Review of Systems  Constitutional: Negative.  HENT: Negative.  Eyes: Negative.  Respiratory: Negative.  Cardiovascular: Negative.  Gastrointestinal: Positive for abdominal pain.  Genitourinary: Negative.  Musculoskeletal: Negative.  Skin: Negative.  Neurological: Negative.  Endo/Heme/Allergies: Negative.  Psychiatric/Behavioral: Negative.    Medical History: History reviewed. No pertinent past medical history.  Patient Active Problem List  Diagnosis  Epigastric hernia   Past Surgical History:  Procedure Laterality Date  CESAREAN SECTION 01/25/2020    No Known Allergies  No current outpatient medications on file prior to visit.   No current facility-administered medications on file prior to visit.   Family History  Problem Relation Age of Onset  Obesity Mother  High blood pressure (Hypertension) Mother    Social History   Tobacco Use  Smoking Status Never  Smokeless Tobacco Not on file    Social History   Socioeconomic History   Marital status: Single  Tobacco Use  Smoking status: Never  Vaping Use  Vaping status: Never Used  Substance and Sexual Activity  Alcohol use: Yes  Alcohol/week: 1.0 standard drink of alcohol  Types: 1 Glasses of wine per week  Drug use: Never   Objective:   Vitals:  BP: 123/83  Pulse: 73  Temp: 36.6 C (97.9 F)  SpO2: 99%  Weight: 87.7 kg (193 lb 6.4 oz)  Height: 175.3 cm (5\' 9" )  PainSc: 6   Body mass index is 28.56 kg/m.  Physical Exam   GENERAL APPEARANCE Comfortable, no acute issues Development: normal Gross deformities: none  SKIN Rash, lesions, ulcers: none Induration, erythema: none Nodules: none palpable  EYES Conjunctiva and lids: normal Pupils: equal  EARS, NOSE, MOUTH, THROAT External ears: no lesion or deformity External nose: no lesion or deformity Hearing: grossly normal  NECK Symmetric: yes Trachea: midline  CHEST/CV Not assessed  ABDOMEN In the midline just above the umbilicus is a soft tissue mass measuring approximately 4 to 5 cm in diameter which augments with Valsalva and becomes slightly smaller with relaxation and palpation. I am not able to assess the underlying fascial defect but would guess that it might be 2 to 3 cm in size at most. The hernia is not reducible.  GENITOURINARY/RECTAL Not assessed  MUSCULOSKELETAL Station and gait: normal Digits and nails: no clubbing or cyanosis Muscle strength: grossly normal all extremities Deformity: none  PSYCHIATRIC Oriented to person, place, and time: yes Mood and affect: normal for situation Judgment and insight: appropriate for situation  Assessment and Plan:   Epigastric hernia  The patient presents  today for follow-up having been evaluated in 2023 for what appeared to be an epigastric hernia. The bulge in the upper midline of the abdomen has persisted and may be slightly become larger in size. She is having intermittent discomfort. She would like to proceed with surgical  repair.  Today we discussed the size and location of the surgical incision. We discussed the use of mesh. We discussed doing this as an outpatient surgical procedure. We discussed the risk of recurrence. We discussed restrictions on her activities after surgery and return to work and full duty. Patient would like to proceed in the near future. We will schedule her procedure at a time convenient for the patient.   Darnell Level, MD [facility] A [facility] practice Office: [phone]

## 2023-03-08 ENCOUNTER — Other Ambulatory Visit: Payer: Self-pay | Admitting: Medical Genetics

## 2023-03-13 ENCOUNTER — Encounter (HOSPITAL_COMMUNITY): Payer: Self-pay | Admitting: Surgery

## 2023-03-13 NOTE — Anesthesia Preprocedure Evaluation (Addendum)
Anesthesia Evaluation  Patient identified by MRN, date of birth, ID band Patient awake    Reviewed: Allergy & Precautions, NPO status , Patient's Chart, lab work & pertinent test results  Airway Mallampati: II  TM Distance: >3 FB Neck ROM: Full    Dental no notable dental hx.    Pulmonary neg pulmonary ROS   Pulmonary exam normal breath sounds clear to auscultation       Cardiovascular negative cardio ROS Normal cardiovascular exam Rhythm:Regular Rate:Normal     Neuro/Psych  PSYCHIATRIC DISORDERS Anxiety     negative neurological ROS     GI/Hepatic Neg liver ROS,GERD  Medicated,,  Endo/Other  negative endocrine ROS    Renal/GU negative Renal ROS  negative genitourinary   Musculoskeletal negative musculoskeletal ROS (+)    Abdominal   Peds  (+) ATTENTION DEFICIT DISORDER WITHOUT HYPERACTIVITY Hematology negative hematology ROS (+)   Anesthesia Other Findings   Reproductive/Obstetrics                             Anesthesia Physical Anesthesia Plan  ASA: 2  Anesthesia Plan: General   Post-op Pain Management: Tylenol PO (pre-op)* and Celebrex PO (pre-op)*   Induction: Intravenous  PONV Risk Score and Plan: 3 and Treatment may vary due to age or medical condition, Ondansetron and Dexamethasone  Airway Management Planned: LMA and Oral ETT  Additional Equipment: None  Intra-op Plan:   Post-operative Plan:   Informed Consent: I have reviewed the patients History and Physical, chart, labs and discussed the procedure including the risks, benefits and alternatives for the proposed anesthesia with the patient or authorized representative who has indicated his/her understanding and acceptance.     Dental advisory given  Plan Discussed with: CRNA and Anesthesiologist  Anesthesia Plan Comments:         Anesthesia Quick Evaluation

## 2023-03-14 ENCOUNTER — Ambulatory Visit (HOSPITAL_COMMUNITY): Payer: 59

## 2023-03-14 ENCOUNTER — Other Ambulatory Visit: Payer: Self-pay

## 2023-03-14 ENCOUNTER — Ambulatory Visit (HOSPITAL_COMMUNITY): Payer: 59 | Admitting: Anesthesiology

## 2023-03-14 ENCOUNTER — Encounter (HOSPITAL_COMMUNITY): Payer: Self-pay | Admitting: Surgery

## 2023-03-14 ENCOUNTER — Ambulatory Visit (HOSPITAL_COMMUNITY)
Admission: RE | Admit: 2023-03-14 | Discharge: 2023-03-14 | Disposition: A | Discharge status: Home / Self Care | Payer: 59 | Source: Ambulatory Visit | Attending: Surgery | Admitting: Surgery

## 2023-03-14 ENCOUNTER — Encounter (HOSPITAL_COMMUNITY)
Admission: RE | Disposition: A | Discharge status: Home / Self Care | Payer: Self-pay | Source: Ambulatory Visit | Attending: Surgery

## 2023-03-14 DIAGNOSIS — K219 Gastro-esophageal reflux disease without esophagitis: Secondary | ICD-10-CM | POA: Diagnosis not present

## 2023-03-14 DIAGNOSIS — K439 Ventral hernia without obstruction or gangrene: Secondary | ICD-10-CM | POA: Diagnosis present

## 2023-03-14 DIAGNOSIS — Z79899 Other long term (current) drug therapy: Secondary | ICD-10-CM | POA: Insufficient documentation

## 2023-03-14 DIAGNOSIS — K449 Diaphragmatic hernia without obstruction or gangrene: Secondary | ICD-10-CM | POA: Diagnosis not present

## 2023-03-14 DIAGNOSIS — Z01818 Encounter for other preprocedural examination: Secondary | ICD-10-CM

## 2023-03-14 HISTORY — PX: EPIGASTRIC HERNIA REPAIR: SHX404

## 2023-03-14 LAB — POCT PREGNANCY, URINE: Preg Test, Ur: NEGATIVE

## 2023-03-14 SURGERY — REPAIR, HERNIA, EPIGASTRIC, ADULT
Anesthesia: General

## 2023-03-14 MED ORDER — TRAMADOL HCL 50 MG PO TABS: 50.0000 mg | ORAL_TABLET | Freq: Four times a day (QID) | ORAL | 0 refills | Status: AC | PRN

## 2023-03-14 MED ORDER — BUPIVACAINE LIPOSOME 1.3 % IJ SUSP
INTRAMUSCULAR | Status: DC | PRN
  Administered 2023-03-14: 40 mL

## 2023-03-14 MED ORDER — CHLORHEXIDINE GLUCONATE CLOTH 2 % EX PADS: 6.0000 | MEDICATED_PAD | Freq: Once | CUTANEOUS | Status: DC

## 2023-03-14 MED ORDER — MEPERIDINE HCL 50 MG/ML IJ SOLN: 6.2500 mg | INTRAMUSCULAR | Status: DC | PRN

## 2023-03-14 MED ORDER — ROCURONIUM BROMIDE 10 MG/ML (PF) SYRINGE
PREFILLED_SYRINGE | INTRAVENOUS | Status: AC
Start: 2023-03-14 — End: ?
  Filled 2023-03-14: qty 10

## 2023-03-14 MED ORDER — CHLORHEXIDINE GLUCONATE 0.12 % MT SOLN
15.0000 mL | Freq: Once | OROMUCOSAL | Status: AC
  Administered 2023-03-14: 15 mL via OROMUCOSAL

## 2023-03-14 MED ORDER — DEXAMETHASONE SODIUM PHOSPHATE 10 MG/ML IJ SOLN
INTRAMUSCULAR | Status: DC | PRN
  Administered 2023-03-14: 10 mg via INTRAVENOUS

## 2023-03-14 MED ORDER — ONDANSETRON HCL 4 MG/2ML IJ SOLN
INTRAMUSCULAR | Status: AC
  Filled 2023-03-14: qty 2

## 2023-03-14 MED ORDER — ACETAMINOPHEN 325 MG PO TABS: 325.0000 mg | ORAL_TABLET | ORAL | Status: DC | PRN

## 2023-03-14 MED ORDER — BUPIVACAINE HCL (PF) 0.25 % IJ SOLN
INTRAMUSCULAR | Status: AC
  Filled 2023-03-14: qty 30

## 2023-03-14 MED ORDER — ORAL CARE MOUTH RINSE: 15.0000 mL | Freq: Once | OROMUCOSAL | Status: AC

## 2023-03-14 MED ORDER — OXYCODONE HCL 5 MG/5ML PO SOLN: 5.0000 mg | Freq: Once | ORAL | Status: DC | PRN

## 2023-03-14 MED ORDER — ONDANSETRON 4 MG PO TBDP
ORAL_TABLET | ORAL | Status: AC
  Filled 2023-03-14: qty 1

## 2023-03-14 MED ORDER — PROPOFOL 10 MG/ML IV BOLUS
INTRAVENOUS | Status: DC | PRN
  Administered 2023-03-14 (×2): 200 mg via INTRAVENOUS

## 2023-03-14 MED ORDER — ESMOLOL HCL 100 MG/10ML IV SOLN
INTRAVENOUS | Status: DC | PRN
  Administered 2023-03-14 (×2): 20 mg via INTRAVENOUS

## 2023-03-14 MED ORDER — SUGAMMADEX SODIUM 500 MG/5ML IV SOLN
INTRAVENOUS | Status: DC | PRN
  Administered 2023-03-14: 100 mg via INTRAVENOUS

## 2023-03-14 MED ORDER — FENTANYL CITRATE (PF) 100 MCG/2ML IJ SOLN
INTRAMUSCULAR | Status: AC
  Filled 2023-03-14: qty 2

## 2023-03-14 MED ORDER — FENTANYL CITRATE (PF) 100 MCG/2ML IJ SOLN
INTRAMUSCULAR | Status: DC | PRN
  Administered 2023-03-14: 100 ug via INTRAVENOUS

## 2023-03-14 MED ORDER — DEXAMETHASONE SODIUM PHOSPHATE 10 MG/ML IJ SOLN
INTRAMUSCULAR | Status: AC
Start: 2023-03-14 — End: ?
  Filled 2023-03-14: qty 1

## 2023-03-14 MED ORDER — LIDOCAINE HCL (PF) 2 % IJ SOLN
INTRAMUSCULAR | Status: AC
Start: 2023-03-14 — End: ?
  Filled 2023-03-14: qty 5

## 2023-03-14 MED ORDER — OXYCODONE HCL 5 MG PO TABS: 5.0000 mg | ORAL_TABLET | Freq: Once | ORAL | Status: DC | PRN

## 2023-03-14 MED ORDER — MIDAZOLAM HCL 2 MG/2ML IJ SOLN
INTRAMUSCULAR | Status: AC
Start: 2023-03-14 — End: ?
  Filled 2023-03-14: qty 2

## 2023-03-14 MED ORDER — LACTATED RINGERS IV SOLN: INTRAVENOUS | Status: DC

## 2023-03-14 MED ORDER — CEFAZOLIN SODIUM-DEXTROSE 2-4 GM/100ML-% IV SOLN
2.0000 g | INTRAVENOUS | Status: AC
  Administered 2023-03-14: 2 g via INTRAVENOUS
  Filled 2023-03-14: qty 100

## 2023-03-14 MED ORDER — ONDANSETRON HCL 4 MG/2ML IJ SOLN
INTRAMUSCULAR | Status: DC | PRN
  Administered 2023-03-14: 4 mg via INTRAVENOUS

## 2023-03-14 MED ORDER — BUPIVACAINE LIPOSOME 1.3 % IJ SUSP
INTRAMUSCULAR | Status: AC
Start: 2023-03-14 — End: ?
  Filled 2023-03-14: qty 20

## 2023-03-14 MED ORDER — LIDOCAINE HCL (CARDIAC) PF 100 MG/5ML IV SOSY
PREFILLED_SYRINGE | INTRAVENOUS | Status: DC | PRN
  Administered 2023-03-14: 100 mg via INTRATRACHEAL

## 2023-03-14 MED ORDER — ROCURONIUM BROMIDE 100 MG/10ML IV SOLN
INTRAVENOUS | Status: DC | PRN
  Administered 2023-03-14: 50 mg via INTRAVENOUS

## 2023-03-14 MED ORDER — ONDANSETRON HCL 4 MG/2ML IJ SOLN: 4.0000 mg | Freq: Once | INTRAMUSCULAR | Status: DC | PRN

## 2023-03-14 MED ORDER — ACETAMINOPHEN 160 MG/5ML PO SOLN: 325.0000 mg | ORAL | Status: DC | PRN

## 2023-03-14 MED ORDER — 0.9 % SODIUM CHLORIDE (POUR BTL) OPTIME
TOPICAL | Status: DC | PRN
Start: 2023-03-14 — End: 2023-03-14
  Administered 2023-03-14: 1000 mL

## 2023-03-14 MED ORDER — FENTANYL CITRATE PF 50 MCG/ML IJ SOSY: 25.0000 ug | PREFILLED_SYRINGE | INTRAMUSCULAR | Status: DC | PRN

## 2023-03-14 MED ORDER — MIDAZOLAM HCL 2 MG/2ML IJ SOLN
INTRAMUSCULAR | Status: DC | PRN
  Administered 2023-03-14: 2 mg via INTRAVENOUS

## 2023-03-14 MED ORDER — DEXMEDETOMIDINE HCL IN NACL 200 MCG/50ML IV SOLN
INTRAVENOUS | Status: DC | PRN
  Administered 2023-03-14: 20 ug via INTRAVENOUS

## 2023-03-14 SURGICAL SUPPLY — 25 items
BAG COUNTER SPONGE SURGICOUNT (BAG) IMPLANT
BINDER ABDOMINAL 12 ML 46-62 (SOFTGOODS) IMPLANT
CHLORAPREP W/TINT 26 (MISCELLANEOUS) ×2 IMPLANT
COVER SURGICAL LIGHT HANDLE (MISCELLANEOUS) ×1 IMPLANT
DERMABOND ADVANCED .7 DNX6 (GAUZE/BANDAGES/DRESSINGS) IMPLANT
DRAPE LAPAROSCOPIC ABDOMINAL (DRAPES) ×1 IMPLANT
ELECT REM PT RETURN 15FT ADLT (MISCELLANEOUS) ×1 IMPLANT
GAUZE SPONGE 4X4 12PLY STRL (GAUZE/BANDAGES/DRESSINGS) ×1 IMPLANT
GLOVE SURG ORTHO 8.0 STRL STRW (GLOVE) ×1 IMPLANT
GLOVE SURG SYN 7.5 E (GLOVE) ×1
GLOVE SURG SYN 7.5 PF PI (GLOVE) ×1 IMPLANT
GOWN STRL REUS W/ TWL XL LVL3 (GOWN DISPOSABLE) ×2 IMPLANT
KIT BASIN OR (CUSTOM PROCEDURE TRAY) ×1 IMPLANT
KIT TURNOVER KIT A (KITS) IMPLANT
MARKER SKIN DUAL TIP RULER LAB (MISCELLANEOUS) ×1 IMPLANT
MESH VENTRALEX ST 8CM LRG (Mesh General) IMPLANT
PACK GENERAL/GYN (CUSTOM PROCEDURE TRAY) ×1 IMPLANT
STAPLER SKIN PROX WIDE 3.9 (STAPLE) ×1 IMPLANT
SUT MNCRL AB 4-0 PS2 18 (SUTURE) IMPLANT
SUT NOVA 1 T20/GS 25DT (SUTURE) IMPLANT
SUT NOVA NAB GS-21 0 18 T12 DT (SUTURE) IMPLANT
SUT VIC AB 2-0 CT2 27 (SUTURE) IMPLANT
SUT VIC AB 3-0 SH 18 (SUTURE) IMPLANT
TOWEL OR 17X26 10 PK STRL BLUE (TOWEL DISPOSABLE) ×1 IMPLANT
TRAY FOLEY MTR SLVR 16FR STAT (SET/KITS/TRAYS/PACK) IMPLANT

## 2023-03-14 NOTE — Discharge Instructions (Addendum)
[facility]  HERNIA REPAIR POST OP INSTRUCTIONS  Always review your discharge instruction sheet given to you by the facility where your surgery was performed.  A  prescription for pain medication may be sent to your pharmacy on discharge.  Take your pain medication as prescribed.  If narcotic pain medicine is not needed, then you may take acetaminophen (Tylenol) or ibuprofen (Advil) as needed.  Take your usually prescribed medications unless otherwise directed.  If you need a refill on your pain medication, please contact your pharmacy.  They will contact our office to request authorization. Prescriptions will not be filled after 5:00 PM daily or on weekends.  You should follow a light diet the first 24 hours after arrival home, such as soup and crackers or toast.  Be sure to include plenty of fluids daily.  Resume your normal diet the day after surgery.  Most patients will experience some swelling and bruising around the surgical site.  Ice packs and reclining will help.  Swelling and bruising can take several days to resolve.   It is common to experience some constipation if taking pain medication after surgery.  Increasing fluid intake and taking a stool softener (such as Colace) will usually help or prevent this problem from occurring.  A mild laxative (Milk of Magnesia or Miralax) should be taken according to package directions if there is no bowel movement after 48 hours.  You will likely have Dermabond (topical glue) over your incisions.  This seals the incisions and allows you to bathe and shower at any time after your surgery.  Glue should remain in place for up to 10 days.  It may be removed after 10 days by pealing off the Dermabond material or using Vaseline or naval jelly to remove.  ACTIVITIES:  You may resume regular (light) daily activities beginning the next day - such as daily self-care, walking, climbing stairs - gradually increasing activities as tolerated.  You  may have sexual intercourse when it is comfortable.  Refrain from any heavy lifting or straining until approved by your doctor.  You may drive when you are no longer taking prescription pain medication, when you can comfortably wear a seatbelt, and when you can safely maneuver your car and apply the brakes.  You should see your doctor in the office for a follow-up appointment approximately 2-3 weeks after your surgery.  Make sure that you call for this appointment within a day or two after you arrive home to insure a convenient appointment time.  WHEN TO CALL YOUR DOCTOR: Fever greater than 101.0 Inability to urinate Persistent nausea and/or vomiting Extreme swelling or bruising Continued bleeding from incision Increased pain, redness, or drainage from the incision  The clinic staff is available to answer your questions during regular business hours.  Please don't hesitate to call and ask to speak to one of the nurses for clinical concerns.  If you have a medical emergency, go to the nearest emergency room or call 911.  A surgeon from [facility] is always on call for the hospital.   [facility] [address], [address], [place], [place]  [address]  [phone] ? [phone] ? FAX [phone] 

## 2023-03-14 NOTE — Anesthesia Procedure Notes (Signed)
Procedure Name: Intubation Date/Time: 03/14/2023 8:48 AM  Performed by: Donna Bernard, CRNAPre-anesthesia Checklist: Patient identified, Emergency Drugs available, Suction available, Patient being monitored and Timeout performed Patient Re-evaluated:Patient Re-evaluated prior to induction Oxygen Delivery Method: Circle system utilized Preoxygenation: Pre-oxygenation with 100% oxygen Induction Type: IV induction Ventilation: Mask ventilation without difficulty Laryngoscope Size: Miller and 2 Grade View: Grade I Tube type: Oral Tube size: 7.0 mm Number of attempts: 1 Airway Equipment and Method: Stylet Placement Confirmation: positive ETCO2, ETT inserted through vocal cords under direct vision, CO2 detector and breath sounds checked- equal and bilateral Tube secured with: Tape Dental Injury: Teeth and Oropharynx as per pre-operative assessment

## 2023-03-14 NOTE — Transfer of Care (Signed)
Immediate Anesthesia Transfer of Care Note  Patient: Stacy Mueller  Procedure(s) Performed: OPEN HERNIA REPAIR EPIGASTRIC ADULT WITH MESH  Patient Location: PACU  Anesthesia Type:General  Level of Consciousness: awake, alert , oriented, and patient cooperative  Airway & Oxygen Therapy: Patient connected to face mask oxygen  Post-op Assessment: Report given to RN and Post -op Vital signs reviewed and stable  Post vital signs: stable  Last Vitals:  Vitals Value Taken Time  BP 125/76 03/14/23 1013  Temp    Pulse 82 03/14/23 1015  Resp 15 03/14/23 1015  SpO2 100 % 03/14/23 1015  Vitals shown include unfiled device data.  Last Pain:  Vitals:   03/14/23 0711  TempSrc:   PainSc: 0-No pain         Complications: No notable events documented.

## 2023-03-14 NOTE — Anesthesia Postprocedure Evaluation (Signed)
Anesthesia Post Note  Patient: Stacy Mueller  Procedure(s) Performed: OPEN HERNIA REPAIR EPIGASTRIC ADULT WITH MESH     Patient location during evaluation: PACU Anesthesia Type: General Level of consciousness: awake and alert Pain management: pain level controlled Vital Signs Assessment: post-procedure vital signs reviewed and stable Respiratory status: spontaneous breathing, nonlabored ventilation, respiratory function stable and patient connected to nasal cannula oxygen Cardiovascular status: blood pressure returned to baseline and stable Postop Assessment: no apparent nausea or vomiting Anesthetic complications: no   No notable events documented.  Last Vitals:  Vitals:   03/14/23 1014 03/14/23 1015  BP: 125/76 116/72  Pulse: 91 82  Resp: 11 15  Temp:    SpO2: 100% 100%    Last Pain:  Vitals:   03/14/23 0711  TempSrc:   PainSc: 0-No pain                 Holten Spano

## 2023-03-14 NOTE — Op Note (Signed)
Operative Note  Pre-operative Diagnosis:  epigastric hernia  Post-operative Diagnosis:  same  Surgeon:  Darnell Level, MD  Assistant:  none   Procedure:  open repair epigastric hernia with Bard Ventralex mesh patch (defect 4 cm x 3 cm)  Anesthesia:  general  Estimated Blood Loss:  20 cc  Drains: none         Specimen: none  Indications:  Patient had previously been evaluated in 2023 with a soft tissue mass in the upper abdominal wall in the midline felt to represent an epigastric hernia versus a soft tissue mass such as a lipoma. Decision was made to proceed with surgery but due to restrictions with her job she was not able to proceed. Patient now returns to discuss proceeding with surgery for repair of what appears to be a epigastric hernia. Patient states that it may have become slightly larger. She is having intermittent discomfort. She has had no signs or symptoms of intestinal obstruction. Patient now comes to surgery for hernia repair.  Procedure:  The patient was seen in the pre-op holding area. The risks, benefits, complications, treatment options, and expected outcomes were previously discussed with the patient. The patient agreed with the proposed plan and has signed the informed consent form.  The patient was brought to the operating room by the surgical team, identified as Leilani Merl and the procedure verified. A "time out" was completed and the above information confirmed.  Following induction of general anesthesia, the patient is positioned and then prepped and draped in the usual aseptic fashion.  After ascertaining that an adequate level of anesthesia been achieved, a midline incision is made just above the level of the umbilicus.  Dissection is carried into the subcutaneous tissues and hemostasis achieved with the electrocautery.  2 separate hernia sacs were identified.  These were dissected out down to the level of the abdominal wall.  There were 2 fascial defects  in the midline fascia above the level of the umbilicus.  There was a small bridge of fascia between the 2.  This was divided creating a single defect measuring 4 cm x 3 cm in size.  The preperitoneal tissue and omental tissue which were within the hernia sacs were partially excised and the remainder reduced back within the peritoneal cavity.  A Bard Ventralex 8 cm ST patch was selected for the repair.  It was prepared and inserted into the preperitoneal space.  It was deployed circumferentially.  The fascia was closed with interrupted 0 Novafil sutures incorporating the underlying mesh into the closure.  Good approximation was achieved without significant tension.  Local anesthetic with Exparel rel and Marcaine was infiltrated into the fascia circumferentially.  Good hemostasis was achieved.  Subcutaneous tissues were closed with interrupted 3-0 Vicryl sutures.  Skin was anesthetized with Exparel and Marcaine.  Skin edges were reapproximated with a running 4-0 Monocryl subcuticular suture.  Wound was washed and dried and Dermabond was applied as dressing.  Patient was awakened from anesthesia.  There was a missing SH needle during the closure portion of the case.  Prior to removing the patient from the operating room table and abdominal x-ray was taken confirming that there was no needle within the patient or on the patient.  Patient was transported to the recovery room in stable condition.  The patient tolerated the procedure well.   Darnell Level, MD [facility] Surgery Office: [phone]

## 2023-03-14 NOTE — Interval H&P Note (Signed)
History and Physical Interval Note:  03/14/2023 8:29 AM  Stacy Mueller  has presented today for surgery, with the diagnosis of EPIGASTRIC HERNIA.  The various methods of treatment have been discussed with the patient and family. After consideration of risks, benefits and other options for treatment, the patient has consented to    Procedure(s): OPEN HERNIA REPAIR EPIGASTRIC ADULT EITH MESH (N/A) as a surgical intervention.   The patient's history has been reviewed, patient examined, no change in status, stable for surgery.  I have reviewed the patient's chart and labs.  Questions were answered to the patient's satisfaction.    Darnell Level, MD [facility] practice Office: [phone]   Darnell Level

## 2023-03-15 ENCOUNTER — Encounter (HOSPITAL_COMMUNITY): Payer: Self-pay | Admitting: Surgery

## 2023-03-15 ENCOUNTER — Other Ambulatory Visit (HOSPITAL_COMMUNITY): Payer: Self-pay | Admitting: Surgery

## 2023-03-15 ENCOUNTER — Other Ambulatory Visit: Payer: Self-pay | Admitting: Nurse Practitioner

## 2023-03-15 DIAGNOSIS — F419 Anxiety disorder, unspecified: Secondary | ICD-10-CM

## 2023-03-15 DIAGNOSIS — F909 Attention-deficit hyperactivity disorder, unspecified type: Secondary | ICD-10-CM

## 2023-03-15 DIAGNOSIS — M1990 Unspecified osteoarthritis, unspecified site: Secondary | ICD-10-CM

## 2023-03-15 MED ORDER — OXYCODONE HCL 5 MG PO TABS: 5.0000 mg | ORAL_TABLET | Freq: Four times a day (QID) | ORAL | 0 refills | Status: AC | PRN

## 2023-03-15 MED ORDER — ALPRAZOLAM 0.5 MG PO TABS
ORAL_TABLET | ORAL | 0 refills | Status: DC
Start: 2023-03-15 — End: 2023-12-04

## 2023-03-15 MED ORDER — MELOXICAM 15 MG PO TABS
ORAL_TABLET | ORAL | 0 refills | Status: DC
Start: 2023-03-15 — End: 2023-08-14

## 2023-03-15 MED ORDER — AMPHETAMINE-DEXTROAMPHETAMINE 30 MG PO TABS
ORAL_TABLET | ORAL | 0 refills | Status: DC
Start: 2023-03-15 — End: 2023-08-14

## 2023-03-25 ENCOUNTER — Other Ambulatory Visit (HOSPITAL_COMMUNITY): Payer: Self-pay | Attending: Medical Genetics

## 2023-07-02 ENCOUNTER — Encounter: Payer: No Typology Code available for payment source | Admitting: Nurse Practitioner

## 2023-07-08 ENCOUNTER — Encounter: Payer: No Typology Code available for payment source | Admitting: Nurse Practitioner

## 2023-08-14 ENCOUNTER — Other Ambulatory Visit: Payer: Self-pay | Admitting: Internal Medicine

## 2023-08-14 DIAGNOSIS — F909 Attention-deficit hyperactivity disorder, unspecified type: Secondary | ICD-10-CM

## 2023-08-14 DIAGNOSIS — M1990 Unspecified osteoarthritis, unspecified site: Secondary | ICD-10-CM

## 2023-08-14 NOTE — Telephone Encounter (Signed)
 Please see below.  Copied from CRM [id number]. Topic: Clinical - Medication Question >> Aug 14, 2023  4:31 PM Turkey B wrote: Reason for CRM: pt was a pt of Dr Talmadge Fail and needs her meds of , meloxicam  (MOBIC ) 15 MG tablet  and dextroamphetamine  (ADDERALL) 30 MG tablet. She has an appt attentively scheduled for 07/25 and uses,      [id number] - [place], [place] - [address] AT [address]OSS [address]  Phone: [phone] Fax: [phone]

## 2023-08-15 ENCOUNTER — Other Ambulatory Visit: Payer: Self-pay | Admitting: Family

## 2023-08-15 DIAGNOSIS — M1990 Unspecified osteoarthritis, unspecified site: Secondary | ICD-10-CM

## 2023-08-15 DIAGNOSIS — F909 Attention-deficit hyperactivity disorder, unspecified type: Secondary | ICD-10-CM

## 2023-08-15 MED ORDER — AMPHETAMINE-DEXTROAMPHETAMINE 30 MG PO TABS: ORAL_TABLET | ORAL | 0 refills | Status: DC

## 2023-08-15 MED ORDER — MELOXICAM 15 MG PO TABS
ORAL_TABLET | ORAL | 0 refills | Status: DC
Start: 2023-08-15 — End: 2023-12-04

## 2023-08-15 MED ORDER — AMPHETAMINE-DEXTROAMPHETAMINE 30 MG PO TABS
ORAL_TABLET | ORAL | 0 refills | Status: DC
Start: 2023-08-15 — End: 2023-10-16

## 2023-08-15 MED ORDER — MELOXICAM 15 MG PO TABS
ORAL_TABLET | ORAL | 0 refills | Status: DC
Start: 2023-08-15 — End: 2023-08-15

## 2023-10-16 ENCOUNTER — Other Ambulatory Visit (HOSPITAL_BASED_OUTPATIENT_CLINIC_OR_DEPARTMENT_OTHER): Payer: Self-pay

## 2023-10-16 DIAGNOSIS — F909 Attention-deficit hyperactivity disorder, unspecified type: Secondary | ICD-10-CM

## 2023-10-16 NOTE — Telephone Encounter (Unsigned)
 Copied from CRM [id number]. Topic: Clinical - Medication Question >> Oct 16, 2023  4:48 PM Donee H wrote: Reason for CRM: Patient is stating need a refill for amphetamine -dextroamphetamine  (ADDERALL) 30 MG tablet which was prescribed by a different provider. Patient states only has 2 tablets left and was told by [facility] to reach out to provider for refill request. Looks like patient has a new pt appointment with Thersia Stark on 10-25-2023.  She is asking if this can be refilled and sent to: [id number] - [place], [place] - [address] [place] [address] Phone: [phone] Fax: [phone]

## 2023-10-19 MED ORDER — AMPHETAMINE-DEXTROAMPHETAMINE 30 MG PO TABS: ORAL_TABLET | ORAL | 0 refills | Status: DC

## 2023-10-25 ENCOUNTER — Ambulatory Visit (HOSPITAL_BASED_OUTPATIENT_CLINIC_OR_DEPARTMENT_OTHER): Payer: Self-pay | Admitting: Family Medicine

## 2023-11-06 ENCOUNTER — Other Ambulatory Visit: Payer: Self-pay | Admitting: Family

## 2023-11-06 DIAGNOSIS — M1990 Unspecified osteoarthritis, unspecified site: Secondary | ICD-10-CM

## 2023-12-04 ENCOUNTER — Encounter (HOSPITAL_BASED_OUTPATIENT_CLINIC_OR_DEPARTMENT_OTHER): Payer: Self-pay | Admitting: Family Medicine

## 2023-12-04 ENCOUNTER — Ambulatory Visit (HOSPITAL_BASED_OUTPATIENT_CLINIC_OR_DEPARTMENT_OTHER): Payer: Self-pay | Admitting: Family Medicine

## 2023-12-04 VITALS — BP 120/89 | HR 79 | Ht 65.0 in | Wt 177.0 lb

## 2023-12-04 DIAGNOSIS — I479 Paroxysmal tachycardia, unspecified: Secondary | ICD-10-CM | POA: Insufficient documentation

## 2023-12-04 DIAGNOSIS — E559 Vitamin D deficiency, unspecified: Secondary | ICD-10-CM

## 2023-12-04 DIAGNOSIS — F909 Attention-deficit hyperactivity disorder, unspecified type: Secondary | ICD-10-CM

## 2023-12-04 DIAGNOSIS — Z309 Encounter for contraceptive management, unspecified: Secondary | ICD-10-CM

## 2023-12-04 DIAGNOSIS — Z7689 Persons encountering health services in other specified circumstances: Secondary | ICD-10-CM

## 2023-12-04 DIAGNOSIS — F419 Anxiety disorder, unspecified: Secondary | ICD-10-CM

## 2023-12-04 DIAGNOSIS — E538 Deficiency of other specified B group vitamins: Secondary | ICD-10-CM

## 2023-12-04 DIAGNOSIS — M1990 Unspecified osteoarthritis, unspecified site: Secondary | ICD-10-CM | POA: Insufficient documentation

## 2023-12-04 DIAGNOSIS — E88819 Insulin resistance, unspecified: Secondary | ICD-10-CM

## 2023-12-04 LAB — POCT URINE DRUG SCREEN
Morphine: NOT DETECTED
POC BENZODIAZEPINES UR: NOT DETECTED
POC Cocaine UR: NOT DETECTED
POC Marijuana UR: NOT DETECTED
POC Oxycodone UR: NOT DETECTED

## 2023-12-04 MED ORDER — VITAMIN D3 1.25 MG (50000 UT) PO CAPS
50000.0000 [IU] | ORAL_CAPSULE | ORAL | 3 refills | Status: AC
Start: 2023-12-04 — End: ?

## 2023-12-04 MED ORDER — AMPHETAMINE-DEXTROAMPHETAMINE 30 MG PO TABS: ORAL_TABLET | ORAL | 0 refills | Status: DC

## 2023-12-04 MED ORDER — MELOXICAM 15 MG PO TABS: ORAL_TABLET | ORAL | 0 refills | Status: AC

## 2023-12-04 MED ORDER — ETONOGESTREL-ETHINYL ESTRADIOL 0.12-0.015 MG/24HR VA RING: VAGINAL_RING | VAGINAL | 2 refills | Status: AC

## 2023-12-04 MED ORDER — ALPRAZOLAM 0.5 MG PO TABS
ORAL_TABLET | ORAL | 0 refills | Status: DC
Start: 2023-12-04 — End: 2024-01-08

## 2023-12-04 MED ORDER — CYANOCOBALAMIN 1000 MCG/ML IJ SOLN
INTRAMUSCULAR | 3 refills | Status: AC
Start: 2023-12-04 — End: ?

## 2023-12-04 NOTE — Progress Notes (Signed)
 New Patient Office Visit  Subjective:   Sara Brewer 02/12/1986 12/04/2023  Chief Complaint  Patient presents with   New Patient (Initial Visit)    Patient is here today to get established with the practice. Is needing medication refills.     HPI: Sara Brewer presents today to establish care at [facility] at [facility]. Introduced to Publishing rights manager role and practice setting.  All questions answered.   Last PCP: Elsie Saddler, MD Last annual physical: April 2024 Concerns: See below   Patient is needing several medication refills.   CHRONIC FATIGUE:  She did have hx of anemia in the past as a teenager. She denies acute blood loss or menorrhagia. She has hx of Graves disease and was previously seen by endocrinology. She states Endocrinologist Dr. Nichole recommend PCP continue to monitor without medication at this time. Reports chronic fatigue due to graves, vit b12 and D deficiencies. Currently taking supplementation and needing refill. Denies signs and symptoms of OSA. She is concerned about difficulty losing weight and dx of insulin  resistance.    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 She did not find improvement with medication. She states she can tell if heart is racing and has intermittent episodes. She states exertional [facility] is present for the past 10 years.    ANXIETY:  Patient reports control of anxiety at this time. 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 She takes her Xanax  PRN for severe anxiety and panic attacks only. PDMP revealed last fill April 2024.   ADHD FOLLOW UP Patient presents for the medical management of ADD/ADHD.  Current medication regimen: Adderall 30mg  daily  Medication compliance:  excellent compliance ADHD status: controlled ADHD Medication Side Effects: no    Decreased appetite: no    Headache: no     Sleeping disturbance pattern: no    Irritability: no    Rebound effects (worse than baseline) off medication: no    Anxiousness: no    Dizziness: no    Tics: no   The following portions of the patient's history were reviewed and updated as appropriate: past medical history, past surgical history, family history, social history, allergies, medications, and problem list.   Patient Active Problem List   Diagnosis Date Noted   Tachycardia, paroxysmal (HCC) 12/04/2023   Arthritis 12/04/2023   Fatigue 06/21/2021   Insulin  resistance 11/04/2019   Graves disease    ADHD (attention deficit hyperactivity disorder)    Anxiety    Vitamin B12 deficiency    Vitamin D  deficiency    Past Medical History:  Diagnosis Date   ADD (attention deficit disorder with hyperactivity)    Allergy    Anemia    iron   Anxiety    Arthritis    Back pain    GERD (gastroesophageal reflux disease)    Graves disease 2014   Dr. Nichole    Nausea and vomiting after administration of anesthetic agent    Pelvic pain in female    Vitamin B12 deficiency    Vitamin D  deficiency    Past Surgical History:  Procedure Laterality Date   gum grafts     LAPAROSCOPY  02/19/2011   Procedure: LAPAROSCOPY DIAGNOSTIC;  Surgeon: CHRISTELLA Elvie Pinal;  Location: [facility];  Service: Gynecology;  Laterality: N/A;  Diagnostic Laparoscopy   WISDOM TOOTH EXTRACTION     Family History  Problem Relation Age  of Onset   Stroke Mother 41       mini   Melanoma Father        melanoma   Heart murmur Brother        unsure of extend but will require surgery   Heart disease Brother        valve   Heart disease Maternal Grandmother    Breast cancer Maternal Grandmother 51       breast   Pancreatic cancer Maternal Grandmother        metastatic   Heart disease Maternal Grandfather    Cancer Maternal Grandfather        duodenal   Cancer Paternal Grandmother        lung/ liver/ bone   Heart disease Paternal Grandmother    Heart disease  Paternal Grandfather    Hyperlipidemia Paternal Grandfather    Hypertension Paternal Grandfather    Bladder Cancer Paternal Grandfather 56       former smoker   Melanoma Paternal Grandfather        melanoma   Melanoma Maternal Uncle        melanoma   Social History   Socioeconomic History   Marital status: Significant Other    Spouse name: Not on file   Number of children: Not on file   Years of education: Not on file   Highest education level: Some college, no degree  Occupational History   Not on file  Tobacco Use   Smoking status: Never   Smokeless tobacco: Never  Vaping Use   Vaping status: Never Used  Substance and Sexual Activity   Alcohol use: No   Drug use: No   Sexual activity: Yes    Partners: Male    Birth control/protection: Other-see comments    Comment: Nuva ring  Other Topics Concern   Not on file  Social History Narrative   Not on file   Social Drivers of Health   Financial Resource Strain: Low Risk  (12/03/2023)   Overall Financial Resource Strain (CARDIA)    Difficulty of Paying Living Expenses: Not very hard  Food Insecurity: Food Insecurity Present (12/03/2023)   Hunger Vital Sign    Worried About Running Out of Food in the Last Year: Sometimes true    Ran Out of Food in the Last Year: Sometimes true  Transportation Needs: No Transportation Needs (12/03/2023)   PRAPARE - Administrator, Civil Service (Medical): No    Lack of Transportation (Non-Medical): No  Physical Activity: Insufficiently Active (12/03/2023)   Exercise Vital Sign    Days of Exercise per Week: 2 days    Minutes of Exercise per Session: 20 min  Stress: Stress Concern Present (12/03/2023)   [employer] of Occupational Health - Occupational Stress Questionnaire    Feeling of Stress: To some extent  Social Connections: Moderately Isolated (12/03/2023)   Social Connection and Isolation Panel    Frequency of Communication with Friends and Family: Three times a week     Frequency of Social Gatherings with Friends and Family: Once a week    Attends Religious Services: Never    Database administrator or Organizations: No    Attends Engineer, structural: Not on file    Marital Status: Living with partner  Intimate Partner Violence: Not on file   Outpatient Medications Prior to Visit  Medication Sig Dispense Refill   FIBER COMPLETE PO Take by mouth.     Multiple Vitamin (MULTIVITAMIN) tablet  Take 1 tablet by mouth daily.     Probiotic Product (PROBIOTIC DAILY PO) Take 1 tablet by mouth daily.     ALPRAZolam  (XANAX ) 0.5 MG tablet Take 1/2-1 tab once or twice a day if needed only for panic attacks. Limit to <5 days per week to avoid tolerance. 45 tablet 0   amphetamine -dextroamphetamine  (ADDERALL) 30 MG tablet Take 1 tablet daily as needed for Focus & Concentration; try to limit to <5 /week due to addictive potential. Should last longer than 30 days. 30 tablet 0   Cholecalciferol (VITAMIN D3) 1.25 MG (50000 UT) capsule Take 1 capsule (50,000 Units total) by mouth once a week. 12 capsule 3   cyanocobalamin  (VITAMIN B12) 1000 MCG/ML injection INJECT 1 ML INTO THE SKIN EVERY 30 DAYS 3 mL 3   etonogestrel -ethinyl estradiol  (NUVARING) 0.12-0.015 MG/24HR vaginal ring Insert vaginally and leave in place for 3 consecutive weeks, then remove for 1 week. 3 each 3   meloxicam  (MOBIC ) 15 MG tablet Take 1 tablet daily as needed for pain. Try to limit to 5 days a week to protect kidneys. 60 tablet 0   metoprolol  tartrate (LOPRESSOR ) 25 MG tablet Take 1 tablet ( 12.5 mg ) twice a day (Patient not taking: Reported on 12/04/2023) 180 tablet 3   topiramate  (TOPAMAX ) 50 MG tablet Take 1 tablet (50 mg total) by mouth 2 (two) times daily. 60 tablet 2   No facility-administered medications prior to visit.   Allergies  Allergen Reactions   Codeine  Nausea And Vomiting   Milk-Related Compounds Hives and Nausea And Vomiting   Sulfonamide Derivatives Nausea And Vomiting    Penicillins Nausea And Vomiting    PER PATIENT just nausea and vomiting    ROS: A complete ROS was performed with pertinent positives/negatives noted in the HPI. The remainder of the ROS are negative.   Objective:   Today's Vitals   12/04/23 1314  BP: 120/89  Pulse: 79  SpO2: 100%  Weight: 177 lb (80.3 kg)  Height: 5' 5 (1.651 m)    GENERAL: Well-appearing, in NAD. Well nourished.  SKIN: Pink, warm and dry. No rash, lesion, ulceration, or ecchymoses.  Head: Normocephalic. NECK: Trachea midline. Full ROM w/o pain or tenderness. No thyromegaly or palpable nodules.  RESPIRATORY: Chest wall symmetrical. Respirations even and non-labored. Breath sounds clear to auscultation bilaterally.  CARDIAC: S1, S2 present, regular rate and rhythm without murmur or gallops. Peripheral pulses 2+ bilaterally.  MSK: Muscle tone and strength appropriate for age.  NEUROLOGIC: No motor or sensory deficits. Steady, even gait. C2-C12 intact.  PSYCH/MENTAL STATUS: Alert, oriented x 3. Cooperative, appropriate mood and affect.    Health Maintenance Due  Topic Date Due   COVID-19 Vaccine (1) Never done   Hepatitis B Vaccines 19-59 Average Risk (2 of 3 - 3-dose series) 05/01/1999    Results for orders placed or performed in visit on 12/04/23  POCT Urine Drug Screen  Result Value Ref Range   POC Methamphetamine UR     POC Opiate Ur     POC Barbiturate UR     POC Amphetamine  UR     POC Oxycodone  UR None Detected None Detected   POC Cocaine UR None Detected None Detected   POC Ecstasy UR     POC TRICYCLICS UR     POC PHENCYCLIDINE UR     POC Marijuana UR None Detected None Detected   POC Methadone UR     POC BENZODIAZEPINES UR None Detected None Detected  URINE TEMPERATURE     POC DRUG SCREEN OXIDANTS URINE     POC SPECIFIC GRAVITY URINE     POC PH URINE     Methylenedioxyamphetamine       Morphine none detected        Assessment & Plan:  1. Encounter to establish care with new doctor  (Primary) Discussed role of PCP with patient and recommend return for AE.   2. Attention deficit hyperactivity disorder (ADHD), unspecified ADHD type Discussed safe use of medication and well controlled at this time. PDMP reviewed without concerns. UDS negative and CSA updated in chart today. Refill provided.  - POCT Urine Drug Screen - amphetamine -dextroamphetamine  (ADDERALL) 30 MG tablet; Take 1 tablet daily as needed for Focus & Concentration; try to limit to <5 /week due to addictive potential. Should last longer than 30 days.  Dispense: 30 tablet; Refill: 0  3. Anxiety Controlled currently. PDMP reviewed without concerns. Safe use intermittently reviewed with patient. Refill provided after negative UDS and CSA.  - POCT Urine Drug Screen - ALPRAZolam  (XANAX ) 0.5 MG tablet; Take 1/2-1 tab once or twice a day if needed only for panic attacks. Limit to <5 days per week to avoid tolerance.  Dispense: 30 tablet; Refill: 0  4. Insulin  resistance Discussed improvement of diet, strength training and possible benefit of GLP 1. Will check A1C with labs upcoming with AE.   5. Vitamin D  deficiency Will check Vit D with upcoming labs. Vit D refillled at this time.  - Cholecalciferol (VITAMIN D3) 1.25 MG (50000 UT) CAPS; Take 1 capsule (50,000 Units total) by mouth once a week.  Dispense: 12 capsule; Refill: 3  6. Vitamin B12 deficiency B12 refilled at this time due to chronic deficiency. Will check level with AE labs.  - cyanocobalamin  (VITAMIN B12) 1000 MCG/ML injection; INJECT 1 ML INTO THE SKIN EVERY 30 DAYS  Dispense: 3 mL; Refill: 3  7. Encounter for contraceptive management, unspecified type Tolerating Nuvaring well currently. Refills placed. BP well controlled.  - etonogestrel -ethinyl estradiol  (NUVARING) 0.12-0.015 MG/24HR vaginal ring; Insert vaginally and leave in place for 3 consecutive weeks, then remove for 1 week.  Dispense: 9 each; Refill: 2  8. Arthritis Pt takes Mobic  PRN for  arthritis pain in foot. Safe use reviewed.  - meloxicam  (MOBIC ) 15 MG tablet; Take 1 tablet daily as needed for pain. Try to limit to 5 days a week to protect kidneys.  Dispense: 60 tablet; Refill: 0  9. Tachycardia, paroxysmal (HCC) Currently controlled without metoprolol .Patient had previous cardiac workup which was unremarkable. Will continue to monitor.   Patient to reach out to office if new, worrisome, or unresolved symptoms arise or if no improvement in patient's condition. Patient verbalized understanding and is agreeable to treatment plan. All questions answered to patient's satisfaction.    Return in about 4 weeks (around 01/01/2024) for ANNUAL PHYSICAL (fasting labs at visit) .    Thersia Schuyler Stark, [place]

## 2024-01-08 ENCOUNTER — Ambulatory Visit (INDEPENDENT_AMBULATORY_CARE_PROVIDER_SITE_OTHER): Admitting: Family Medicine

## 2024-01-08 ENCOUNTER — Other Ambulatory Visit (HOSPITAL_COMMUNITY)
Admission: RE | Admit: 2024-01-08 | Discharge: 2024-01-08 | Disposition: A | Discharge status: Home / Self Care | Source: Ambulatory Visit | Attending: Family Medicine | Admitting: Family Medicine

## 2024-01-08 ENCOUNTER — Encounter (HOSPITAL_BASED_OUTPATIENT_CLINIC_OR_DEPARTMENT_OTHER): Payer: Self-pay | Admitting: Family Medicine

## 2024-01-08 VITALS — BP 125/93 | HR 71 | Ht 65.0 in | Wt 172.0 lb

## 2024-01-08 DIAGNOSIS — F909 Attention-deficit hyperactivity disorder, unspecified type: Secondary | ICD-10-CM

## 2024-01-08 DIAGNOSIS — E559 Vitamin D deficiency, unspecified: Secondary | ICD-10-CM

## 2024-01-08 DIAGNOSIS — Z0000 Encounter for general adult medical examination without abnormal findings: Secondary | ICD-10-CM | POA: Diagnosis not present

## 2024-01-08 DIAGNOSIS — Z114 Encounter for screening for human immunodeficiency virus [HIV]: Secondary | ICD-10-CM

## 2024-01-08 DIAGNOSIS — F419 Anxiety disorder, unspecified: Secondary | ICD-10-CM

## 2024-01-08 DIAGNOSIS — E88819 Insulin resistance, unspecified: Secondary | ICD-10-CM

## 2024-01-08 DIAGNOSIS — Z202 Contact with and (suspected) exposure to infections with a predominantly sexual mode of transmission: Secondary | ICD-10-CM | POA: Insufficient documentation

## 2024-01-08 DIAGNOSIS — E538 Deficiency of other specified B group vitamins: Secondary | ICD-10-CM

## 2024-01-08 DIAGNOSIS — Z1159 Encounter for screening for other viral diseases: Secondary | ICD-10-CM

## 2024-01-08 DIAGNOSIS — Z13220 Encounter for screening for lipoid disorders: Secondary | ICD-10-CM

## 2024-01-08 DIAGNOSIS — E0500 Thyrotoxicosis with diffuse goiter without thyrotoxic crisis or storm: Secondary | ICD-10-CM

## 2024-01-08 MED ORDER — ALPRAZOLAM 0.5 MG PO TABS: ORAL_TABLET | ORAL | 0 refills | Status: DC

## 2024-01-08 MED ORDER — BUPROPION HCL ER (XL) 150 MG PO TB24: 150.0000 mg | ORAL_TABLET | Freq: Every day | ORAL | 3 refills | Status: DC

## 2024-01-08 MED ORDER — AMPHETAMINE-DEXTROAMPHETAMINE 30 MG PO TABS: ORAL_TABLET | ORAL | 0 refills | Status: DC
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
## 2024-01-08 NOTE — Patient Instructions (Signed)

## 2024-01-09 LAB — CBC WITH DIFFERENTIAL/PLATELET
Basophils Absolute: 0.1 x10E3/uL (ref 0.0–0.2)
Basos: 1 %
EOS (ABSOLUTE): 0.1 x10E3/uL (ref 0.0–0.4)
Eos: 2 %
Hematocrit: 38 % (ref 34.0–46.6)
Hemoglobin: 12.3 g/dL (ref 11.1–15.9)
Immature Grans (Abs): 0 x10E3/uL (ref 0.0–0.1)
Immature Granulocytes: 0 %
Lymphocytes Absolute: 2 x10E3/uL (ref 0.7–3.1)
Lymphs: 30 %
MCH: 29.8 pg (ref 26.6–33.0)
MCHC: 32.4 g/dL (ref 31.5–35.7)
MCV: 92 fL (ref 79–97)
Monocytes Absolute: 0.5 x10E3/uL (ref 0.1–0.9)
Monocytes: 7 %
Neutrophils Absolute: 4 x10E3/uL (ref 1.4–7.0)
Neutrophils: 60 %
Platelets: 334 x10E3/uL (ref 150–450)
RBC: 4.13 x10E6/uL (ref 3.77–5.28)
RDW: 12.9 % (ref 11.7–15.4)
WBC: 6.8 x10E3/uL (ref 3.4–10.8)

## 2024-01-09 LAB — COMPREHENSIVE METABOLIC PANEL WITH GFR
ALT: 17 IU/L (ref 0–32)
AST: 16 IU/L (ref 0–40)
Albumin: 4.5 g/dL (ref 3.9–4.9)
Alkaline Phosphatase: 93 IU/L (ref 41–116)
BUN/Creatinine Ratio: 13 (ref 9–23)
BUN: 11 mg/dL (ref 6–20)
Bilirubin Total: <0.2 mg/dL (ref 0.0–1.2)
CO2: 16 mmol/L — ABNORMAL LOW (ref 20–29)
Calcium: 9 mg/dL (ref 8.7–10.2)
Chloride: 105 mmol/L (ref 96–106)
Creatinine, Ser: 0.83 mg/dL (ref 0.57–1.00)
Globulin, Total: 2.4 g/dL (ref 1.5–4.5)
Glucose: 90 mg/dL (ref 70–99)
Potassium: 4.2 mmol/L (ref 3.5–5.2)
Sodium: 142 mmol/L (ref 134–144)
Total Protein: 6.9 g/dL (ref 6.0–8.5)
eGFR: 92 mL/min/1.73 (ref >59)

## 2024-01-09 LAB — URINE CYTOLOGY ANCILLARY ONLY
Chlamydia: NEGATIVE
Comment: NEGATIVE
Comment: NEGATIVE
Comment: NORMAL
Neisseria Gonorrhea: NEGATIVE
Trichomonas: NEGATIVE

## 2024-01-09 LAB — LIPID PANEL
Chol/HDL Ratio: 2.5 ratio (ref 0.0–4.4)
Cholesterol, Total: 160 mg/dL (ref 100–199)
HDL: 64 mg/dL (ref >39)
LDL Chol Calc (NIH): 76 mg/dL (ref 0–99)
Triglycerides: 110 mg/dL (ref 0–149)
VLDL Cholesterol Cal: 20 mg/dL (ref 5–40)

## 2024-01-09 LAB — HIV ANTIBODY (ROUTINE TESTING W REFLEX): HIV Screen 4th Generation wRfx: NONREACTIVE

## 2024-01-09 LAB — HEMOGLOBIN A1C
Est. average glucose Bld gHb Est-mCnc: 100 mg/dL
Hgb A1c MFr Bld: 5.1 % (ref 4.8–5.6)

## 2024-01-09 LAB — TSH: TSH: 0.284 u[IU]/mL — ABNORMAL LOW (ref 0.450–4.500)

## 2024-01-09 LAB — VITAMIN B12: Vitamin B-12: >2000 pg/mL — ABNORMAL HIGH (ref 232–1245)

## 2024-01-09 LAB — VITAMIN D 25 HYDROXY (VIT D DEFICIENCY, FRACTURES): Vit D, 25-Hydroxy: 70.1 ng/mL (ref 30.0–100.0)

## 2024-01-09 LAB — HEPATITIS C ANTIBODY: Hep C Virus Ab: NONREACTIVE

## 2024-01-10 ENCOUNTER — Ambulatory Visit (HOSPITAL_BASED_OUTPATIENT_CLINIC_OR_DEPARTMENT_OTHER): Payer: Self-pay | Admitting: Family Medicine

## 2024-01-10 NOTE — Progress Notes (Signed)
 Hi Astraea,  Your electrolytes, kidney and liver function are stable. Blood counts and cholesterol are well controlled. Hep C, HIV and RPR testing is negative. STD panel is negative. Your B12 is high and we could decrease the frequency of your b12 injections to every other month. Your Vitamin D  is great. No signs of prediabetes. Your thyroid  function was slightly low, I would recommend monitoring currently and recheck in 3-4 weeks with TSH. If you are agreeable to this, let me know.

## 2024-01-17 ENCOUNTER — Other Ambulatory Visit: Payer: Self-pay | Admitting: Medical Genetics

## 2024-01-17 DIAGNOSIS — Z006 Encounter for examination for normal comparison and control in clinical research program: Secondary | ICD-10-CM

## 2024-02-05 ENCOUNTER — Ambulatory Visit (HOSPITAL_BASED_OUTPATIENT_CLINIC_OR_DEPARTMENT_OTHER): Admitting: Family Medicine

## 2024-02-13 ENCOUNTER — Ambulatory Visit (INDEPENDENT_AMBULATORY_CARE_PROVIDER_SITE_OTHER): Admitting: Family Medicine

## 2024-02-13 ENCOUNTER — Encounter (HOSPITAL_BASED_OUTPATIENT_CLINIC_OR_DEPARTMENT_OTHER): Payer: Self-pay | Admitting: Family Medicine

## 2024-02-13 VITALS — BP 120/88 | HR 86 | Ht 65.0 in | Wt 168.0 lb

## 2024-02-13 DIAGNOSIS — F419 Anxiety disorder, unspecified: Secondary | ICD-10-CM | POA: Diagnosis not present

## 2024-02-13 DIAGNOSIS — F32A Depression, unspecified: Secondary | ICD-10-CM | POA: Diagnosis not present

## 2024-02-13 DIAGNOSIS — F909 Attention-deficit hyperactivity disorder, unspecified type: Secondary | ICD-10-CM | POA: Diagnosis not present

## 2024-02-13 MED ORDER — AMPHETAMINE-DEXTROAMPHETAMINE 30 MG PO TABS: ORAL_TABLET | ORAL | 0 refills | Status: DC

## 2024-02-13 MED ORDER — ALPRAZOLAM 0.5 MG PO TABS: ORAL_TABLET | ORAL | 0 refills | Status: AC
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
## 2024-04-10 LAB — GENECONNECT MOLECULAR SCREEN: Genetic Analysis Overall Interpretation: NEGATIVE

## 2024-05-05 ENCOUNTER — Other Ambulatory Visit (HOSPITAL_BASED_OUTPATIENT_CLINIC_OR_DEPARTMENT_OTHER): Payer: Self-pay | Admitting: Family Medicine

## 2024-05-05 ENCOUNTER — Encounter (HOSPITAL_BASED_OUTPATIENT_CLINIC_OR_DEPARTMENT_OTHER): Payer: Self-pay | Admitting: Family Medicine

## 2024-05-05 DIAGNOSIS — F909 Attention-deficit hyperactivity disorder, unspecified type: Secondary | ICD-10-CM

## 2024-05-05 MED ORDER — AMPHETAMINE-DEXTROAMPHETAMINE 30 MG PO TABS: ORAL_TABLET | ORAL | 0 refills | Status: AC

## 2024-05-05 NOTE — Telephone Encounter (Signed)
 Please see mychart message sent by pt and advise.

## 2024-05-08 ENCOUNTER — Other Ambulatory Visit (HOSPITAL_BASED_OUTPATIENT_CLINIC_OR_DEPARTMENT_OTHER): Payer: Self-pay | Admitting: Family Medicine
